# Patient Record
Sex: Male | Born: 1960 | Race: White | Hispanic: No | Marital: Married | Smoking: Former smoker
Health system: Southern US, Community
[De-identification: ages and names within clinical notes are randomized; demographics above are authoritative.]

## PROBLEM LIST (undated history)

## (undated) DIAGNOSIS — R7303 Prediabetes: Secondary | ICD-10-CM

## (undated) DIAGNOSIS — K219 Gastro-esophageal reflux disease without esophagitis: Secondary | ICD-10-CM

## (undated) DIAGNOSIS — I10 Essential (primary) hypertension: Secondary | ICD-10-CM

## (undated) HISTORY — PX: INGUINAL HERNIA REPAIR: SUR1180

## (undated) HISTORY — PX: APPENDECTOMY: SHX54

## (undated) HISTORY — PX: COLONOSCOPY: SHX174

---

## 2005-03-28 ENCOUNTER — Other Ambulatory Visit: Payer: Self-pay

## 2005-03-28 ENCOUNTER — Emergency Department: Payer: Self-pay | Admitting: Emergency Medicine

## 2005-03-29 ENCOUNTER — Ambulatory Visit: Payer: Self-pay | Admitting: Internal Medicine

## 2007-02-07 HISTORY — PX: NASAL SEPTOPLASTY W/ TURBINOPLASTY: SHX2070

## 2007-07-09 ENCOUNTER — Ambulatory Visit: Payer: Self-pay | Admitting: Unknown Physician Specialty

## 2007-08-07 ENCOUNTER — Ambulatory Visit: Payer: Self-pay | Admitting: Unknown Physician Specialty

## 2007-08-30 ENCOUNTER — Ambulatory Visit: Payer: Self-pay | Admitting: Gastroenterology

## 2007-10-13 ENCOUNTER — Ambulatory Visit: Payer: Self-pay | Admitting: Family Medicine

## 2007-11-29 ENCOUNTER — Ambulatory Visit: Payer: Self-pay | Admitting: Family Medicine

## 2013-09-18 ENCOUNTER — Ambulatory Visit: Payer: Self-pay | Admitting: Physician Assistant

## 2014-03-10 LAB — HEMOGLOBIN A1C: Hgb A1c MFr Bld: 5.5 % (ref 4.0–6.0)

## 2014-07-13 ENCOUNTER — Encounter: Payer: Self-pay | Admitting: Family Medicine

## 2014-07-13 DIAGNOSIS — K219 Gastro-esophageal reflux disease without esophagitis: Secondary | ICD-10-CM | POA: Insufficient documentation

## 2014-07-13 DIAGNOSIS — E785 Hyperlipidemia, unspecified: Secondary | ICD-10-CM | POA: Insufficient documentation

## 2014-07-13 DIAGNOSIS — L719 Rosacea, unspecified: Secondary | ICD-10-CM | POA: Insufficient documentation

## 2014-07-13 DIAGNOSIS — R7989 Other specified abnormal findings of blood chemistry: Secondary | ICD-10-CM | POA: Insufficient documentation

## 2014-07-13 DIAGNOSIS — N529 Male erectile dysfunction, unspecified: Secondary | ICD-10-CM | POA: Insufficient documentation

## 2014-07-13 DIAGNOSIS — R945 Abnormal results of liver function studies: Secondary | ICD-10-CM | POA: Insufficient documentation

## 2014-07-13 DIAGNOSIS — R739 Hyperglycemia, unspecified: Secondary | ICD-10-CM | POA: Insufficient documentation

## 2014-07-13 DIAGNOSIS — E7800 Pure hypercholesterolemia, unspecified: Secondary | ICD-10-CM | POA: Insufficient documentation

## 2014-07-13 DIAGNOSIS — I10 Essential (primary) hypertension: Secondary | ICD-10-CM | POA: Insufficient documentation

## 2014-07-13 DIAGNOSIS — S5010XA Contusion of unspecified forearm, initial encounter: Secondary | ICD-10-CM | POA: Insufficient documentation

## 2014-07-13 DIAGNOSIS — K589 Irritable bowel syndrome without diarrhea: Secondary | ICD-10-CM | POA: Insufficient documentation

## 2014-07-13 DIAGNOSIS — M79603 Pain in arm, unspecified: Secondary | ICD-10-CM | POA: Insufficient documentation

## 2014-07-13 NOTE — Progress Notes (Signed)
This encounter was created in error - please disregard.

## 2014-07-21 ENCOUNTER — Encounter: Payer: Self-pay | Admitting: Family Medicine

## 2014-07-21 ENCOUNTER — Ambulatory Visit (INDEPENDENT_AMBULATORY_CARE_PROVIDER_SITE_OTHER): Payer: 59 | Admitting: Family Medicine

## 2014-07-21 VITALS — BP 120/60 | HR 76 | Resp 16 | Ht 72.0 in | Wt 215.2 lb

## 2014-07-21 DIAGNOSIS — I10 Essential (primary) hypertension: Secondary | ICD-10-CM

## 2014-07-21 DIAGNOSIS — N528 Other male erectile dysfunction: Secondary | ICD-10-CM | POA: Diagnosis not present

## 2014-07-21 DIAGNOSIS — E7800 Pure hypercholesterolemia, unspecified: Secondary | ICD-10-CM

## 2014-07-21 DIAGNOSIS — N529 Male erectile dysfunction, unspecified: Secondary | ICD-10-CM

## 2014-07-21 MED ORDER — ATORVASTATIN CALCIUM 10 MG PO TABS: 10.0000 mg | ORAL_TABLET | Freq: Every day | ORAL | Status: DC

## 2014-07-21 MED ORDER — AMLODIPINE BESY-BENAZEPRIL HCL 5-10 MG PO CAPS: 1.0000 | ORAL_CAPSULE | Freq: Every day | ORAL | Status: DC

## 2014-07-21 NOTE — Progress Notes (Signed)
Name: Jeremy [name]   MRN: [id number]    DOB: 04/03/1960   Date:07/21/2014       Progress Note  Subjective  Chief Complaint  Chief Complaint  Patient presents with  . Hypertension    HPI  For f/u opf HBP.  Taking BP and chol meds as directed.  Reports a RUQ abd pain in the past that has resolved.  He will let us know if it returns.   History reviewed. No pertinent past medical history.  History reviewed. No pertinent past surgical history.  Family History  Problem Relation Age of Onset  . Cancer Mother     breast  . Clotting disorder Father   . Heart attack Father   . Cancer Brother     pancreatic  . Heart attack Brother     History   Social History  . Marital Status: Single    Spouse Name: N/A  . Number of Children: N/A  . Years of Education: N/A   Occupational History  . Not on file.   Social History Main Topics  . Smoking status: Former Smoker    Types: Cigarettes  . Smokeless tobacco: Never Used  . Alcohol Use: 7.2 oz/week    12 Cans of beer per week  . Drug Use: No  . Sexual Activity: Not on file   Other Topics Concern  . Not on file   Social History Narrative     Current outpatient prescriptions:  .  amLODipine-benazepril (LOTREL) 5-10 MG per capsule, Take by mouth., Disp: , Rfl:  .  atorvastatin (LIPITOR) 10 MG tablet, Take by mouth., Disp: , Rfl:  .  sildenafil (REVATIO) 20 MG tablet, Take by mouth., Disp: , Rfl:   Allergies  Allergen Reactions  . Cephalexin Other (See Comments)     Review of Systems  Constitutional: Negative.  Negative for fever, chills and malaise/fatigue.  HENT: Negative.  Negative for congestion and sore throat.   Eyes: Negative.  Negative for blurred vision.  Respiratory: Negative.  Negative for cough, shortness of breath and wheezing.   Cardiovascular: Negative.  Negative for chest pain, palpitations, orthopnea and leg swelling.  Gastrointestinal: Positive for abdominal pain (vague RUQ pain that has resolvd.).  Negative for heartburn, nausea, vomiting and diarrhea.  Musculoskeletal: Negative.   Skin: Negative.  Negative for rash.  Neurological: Negative for weakness and headaches.     Objective  Filed Vitals:   07/21/14 0839  BP: 106/63  Pulse: 76  Resp: 16  Height: 6' (1.829 m)  Weight: 215 lb 3.2 oz (97.614 kg)    Physical Exam  Constitutional: He is oriented to person, place, and time and well-developed, well-nourished, and in no distress. No distress.  HENT:  Head: Normocephalic and atraumatic.  Eyes: EOM are normal. Pupils are equal, round, and reactive to light.  Neck: Normal range of motion. Neck supple. No thyromegaly present.  Cardiovascular: Normal rate, regular rhythm, normal heart sounds and intact distal pulses.  Exam reveals no gallop and no friction rub.   No murmur heard. Pulmonary/Chest: Effort normal and breath sounds normal. No respiratory distress. He has no wheezes. He exhibits no tenderness.  Abdominal: Soft. He exhibits no mass. There is no tenderness.  Musculoskeletal: He exhibits no edema.  Lymphadenopathy:    He has no cervical adenopathy.  Neurological: He is alert and oriented to person, place, and time.  Vitals reviewed.         Assessment & Plan  Problem List Items Addressed This Visit  Cardiovascular and Mediastinum   Essential (primary) hypertension - Primary     Genitourinary   ED (erectile dysfunction) of organic origin     Other   Hypercholesteremia     1. Essential (primary) hypertension  - amLODipine-benazepril (LOTREL) 5-10 MG per capsule; Take 1 capsule by mouth daily.  Dispense: 990 capsule; Refill: 3  2. Hypercholesteremia  - atorvastatin (LIPITOR) 10 MG tablet; Take 1 tablet (10 mg total) by mouth daily at 6 PM.  Dispense: 90 tablet; Refill: 3  3. ED (erectile dysfunction) of organic origin

## 2014-11-10 ENCOUNTER — Encounter: Payer: Self-pay | Admitting: Family Medicine

## 2014-11-10 ENCOUNTER — Ambulatory Visit (INDEPENDENT_AMBULATORY_CARE_PROVIDER_SITE_OTHER): Payer: 59 | Admitting: Family Medicine

## 2014-11-10 ENCOUNTER — Telehealth: Payer: Self-pay

## 2014-11-10 VITALS — BP 144/82 | HR 82 | Temp 98.2°F | Resp 16 | Ht 72.0 in | Wt 213.0 lb

## 2014-11-10 DIAGNOSIS — L259 Unspecified contact dermatitis, unspecified cause: Secondary | ICD-10-CM | POA: Diagnosis not present

## 2014-11-10 MED ORDER — TRIAMCINOLONE ACETONIDE 0.1 % EX CREA: 1.0000 "application " | TOPICAL_CREAM | Freq: Two times a day (BID) | CUTANEOUS | Status: DC

## 2014-11-10 NOTE — Telephone Encounter (Signed)
Continue to use the steroid cream wherever it breaks out except around the eyes.-jh

## 2014-11-10 NOTE — Patient Instructions (Signed)
May take Benadryl orally for itching

## 2014-11-10 NOTE — Telephone Encounter (Signed)
Pt advised Jeremy [name]

## 2014-11-10 NOTE — Telephone Encounter (Signed)
Pt was seen today for poison Ivy and Dr. Juanetta Gosling mentioned that if it start spreading then call back in office please suggest ?

## 2014-11-10 NOTE — Progress Notes (Signed)
Name: Jeremy [name]   MRN: [id number]    DOB: December 19, 1960   Date:11/10/2014       Progress Note  Subjective  Chief Complaint  Chief Complaint  Patient presents with  . Poison Ivy    Left arm onset yesterday     HPI Here c/o itchy rash on L. Forearm.  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  Rash stated next day.    No problem-specific assessment & plan notes found for this encounter.   History reviewed. No pertinent past medical history.  Social History  Substance Use Topics  . Smoking status: Former Smoker    Types: Cigarettes  . Smokeless tobacco: Never Used  . Alcohol Use: 7.2 oz/week    12 Cans of beer per week     Current outpatient prescriptions:  .  amLODipine-benazepril (LOTREL) 5-10 MG per capsule, Take 1 capsule by mouth daily., Disp: 990 capsule, Rfl: 3 .  atorvastatin (LIPITOR) 10 MG tablet, Take 1 tablet (10 mg total) by mouth daily at 6 PM., Disp: 90 tablet, Rfl: 3 .  sildenafil (REVATIO) 20 MG tablet, Take by mouth., Disp: , Rfl:   Allergies  Allergen Reactions  . Cephalexin Other (See Comments)    Review of Systems  Constitutional: Negative for fever, chills and malaise/fatigue.  Respiratory: Negative for cough, sputum production, shortness of breath and wheezing.   Cardiovascular: Negative for chest pain, palpitations and leg swelling.  Gastrointestinal: Negative for heartburn, abdominal pain and blood in stool.  Genitourinary: Negative for dysuria, urgency and frequency.  Skin: Positive for itching and rash.  Neurological: Negative for dizziness and weakness.      Objective  Filed Vitals:   11/10/14 0850  BP: 144/82  Pulse: 82  Temp: 98.2 F (36.8 C)  TempSrc: Oral  Resp: 16  Height: 6' (1.829 m)  Weight: 213 lb (96.616 kg)     Physical Exam  Constitutional: He is well-developed, well-nourished, and in no distress. No distress.  HENT:  Head: Normocephalic and atraumatic.  Neck: Normal range of motion. Neck supple.  Cardiovascular: Normal  rate and regular rhythm.   Pulmonary/Chest: Effort normal.  Skin: Rash (Red, maculopapular rash with early vesicles on L. forearm) noted.  Vitals reviewed.     No results found for this or any previous visit (from the past 2160 hour(s)).   Assessment & Plan  1. Contact dermatitis  - triamcinolone cream (KENALOG) 0.1 %; Apply 1 application topically 2 (two) times daily.  Dispense: 30 g; Refill: 1

## 2015-01-07 ENCOUNTER — Encounter: Payer: Self-pay | Admitting: Family Medicine

## 2015-01-07 ENCOUNTER — Ambulatory Visit (INDEPENDENT_AMBULATORY_CARE_PROVIDER_SITE_OTHER): Payer: 59 | Admitting: Family Medicine

## 2015-01-07 ENCOUNTER — Ambulatory Visit
Admission: RE | Admit: 2015-01-07 | Discharge: 2015-01-07 | Disposition: A | Discharge status: Home / Self Care | Payer: 59 | Source: Ambulatory Visit | Attending: Family Medicine | Admitting: Family Medicine

## 2015-01-07 VITALS — BP 138/80 | HR 73 | Temp 97.8°F | Resp 16 | Ht 72.0 in | Wt 215.0 lb

## 2015-01-07 DIAGNOSIS — R1013 Epigastric pain: Secondary | ICD-10-CM | POA: Insufficient documentation

## 2015-01-07 DIAGNOSIS — Z23 Encounter for immunization: Secondary | ICD-10-CM | POA: Diagnosis not present

## 2015-01-07 DIAGNOSIS — R0789 Other chest pain: Secondary | ICD-10-CM

## 2015-01-07 DIAGNOSIS — E7800 Pure hypercholesterolemia, unspecified: Secondary | ICD-10-CM

## 2015-01-07 LAB — CBC WITH DIFFERENTIAL/PLATELET
BASOS ABS: 0.1 10*3/uL (ref 0.0–0.2)
Basos: 1 %
EOS (ABSOLUTE): 0.3 10*3/uL (ref 0.0–0.4)
Eos: 4 %
Hematocrit: 47.4 % (ref 37.5–51.0)
Hemoglobin: 16.7 g/dL (ref 12.6–17.7)
IMMATURE GRANS (ABS): 0.1 10*3/uL (ref 0.0–0.1)
Immature Granulocytes: 1 %
LYMPHS ABS: 3.4 10*3/uL — AB (ref 0.7–3.1)
Lymphs: 39 %
MCH: 32.6 pg (ref 26.6–33.0)
MCHC: 35.2 g/dL (ref 31.5–35.7)
MCV: 93 fL (ref 79–97)
MONOCYTES: 6 %
Monocytes Absolute: 0.6 10*3/uL (ref 0.1–0.9)
NEUTROS ABS: 4.4 10*3/uL (ref 1.4–7.0)
Neutrophils: 49 %
Platelets: 186 10*3/uL (ref 150–379)
RBC: 5.12 x10E6/uL (ref 4.14–5.80)
RDW: 12.9 % (ref 12.3–15.4)
WBC: 8.9 10*3/uL (ref 3.4–10.8)

## 2015-01-07 MED ORDER — PANTOPRAZOLE SODIUM 40 MG PO TBEC: 40.0000 mg | DELAYED_RELEASE_TABLET | Freq: Every day | ORAL | Status: DC

## 2015-01-07 NOTE — Patient Instructions (Addendum)
I think your pain is related is to GI causes.  We will try a medication for acid reflux once daily for about 1 month.  We are also going to check some lab work to see if we can determine the cause.  I am also going to do a chest XR to make sure your ribcage is normal.  Please seek immediate medical attention at ER or Urgent Care if you develop: Intractable vomiting or severe abdominal pain.  Chest pain, pressure or tightness. Shortness of breath accompanied by nausea or diaphoresis Visual changes Numbness or tingling on one side of the body Facial droop Altered mental status Or any concerning symptoms.

## 2015-01-07 NOTE — Assessment & Plan Note (Signed)
Check lipid panel today 

## 2015-01-07 NOTE — Progress Notes (Signed)
Subjective:    Patient ID: Jeremy BeaversJeffrey E [name], male    DOB: Aug 12, 1960, 54 y.o.   MRN: [id number]  HPI: Jeremy [name] is a 54 y.o. male presenting on 01/07/2015 for Breast Pain   HPI  Pt presents for sternal pain. Pain is located directly under the sternum at the epigastric region. Pt was evaluated cardiology- stress test normal, echo normal. Cardiac causes ruled out. This was 2 years ago. No X-ray of chest. No trauma to chest. Pain has been increasing over past few weeks and pt feels like there is a lump there. Denies SOB, nausea, syncope or chest tightness. Pain is described as constant dull ache.  History reviewed. No pertinent past medical history.  Current Outpatient Prescriptions on File Prior to Visit  Medication Sig  . amLODipine-benazepril (LOTREL) 5-10 MG per capsule Take 1 capsule by mouth daily.  Jeremy [name]. atorvastatin (LIPITOR) 10 MG tablet Take 1 tablet (10 mg total) by mouth daily at 6 PM.  . sildenafil (REVATIO) 20 MG tablet Take by mouth.   No current facility-administered medications on file prior to visit.    Review of Systems  Constitutional: Negative for fever and chills.  Respiratory: Negative for chest tightness.   Cardiovascular: Negative for chest pain, palpitations and leg swelling.  Neurological: Negative for dizziness and numbness.   Per HPI unless specifically indicated above     Objective:    BP 138/80 mmHg  Pulse 73  Temp(Src) 97.8 F (36.6 C) (Oral)  Resp 16  Ht 6' (1.829 m)  Wt 215 lb (97.523 kg)  BMI 29.15 kg/m2  Wt Readings from Last 3 Encounters:  01/07/15 215 lb (97.523 kg)  11/10/14 213 lb (96.616 kg)  07/21/14 215 lb 3.2 oz (97.614 kg)    Physical Exam  Constitutional: He is oriented to person, place, and time. He appears well-developed and well-nourished. No distress.  HENT:  Head: Normocephalic and atraumatic.  Neck: Neck supple. No thyromegaly present.  Cardiovascular: Normal rate, regular rhythm and normal heart sounds.  Exam  reveals no gallop and no friction rub.   No murmur heard. Pulmonary/Chest: Effort normal and breath sounds normal. He has no wheezes.  Abdominal: Soft. Bowel sounds are normal. He exhibits no distension. There is tenderness in the epigastric area. There is no rigidity, no rebound, no guarding, no tenderness at McBurney's point and negative Murphy's sign.  Musculoskeletal: Normal range of motion. He exhibits no edema or tenderness.  Neurological: He is alert and oriented to person, place, and time. He has normal reflexes.  Skin: Skin is warm and dry. No rash noted. No erythema.  Psychiatric: He has a normal mood and affect. His behavior is normal. Thought content normal.   Results for orders placed or performed in visit on 07/13/14  Hemoglobin A1c  Result Value Ref Range   Hgb A1c MFr Bld 5.5 4.0 - 6.0 %      Assessment & Plan:   Problem List Items Addressed This Visit      Other   Hypercholesteremia    Check lipid panel today.       Relevant Orders   Lipid panel    Other Visit Diagnoses    Chest wall pain    -  Primary    Suspect GI cause. CXR to rule out abnormality. EKG WNL.     Relevant Orders    EKG 12-Lead    DG Chest 2 View    Epigastric abdominal pain  GERD vs PUD. r/o pancreatitis. CMP, CBC, amylase, lipase. Start protonix.  RTC 1 mos. Consider GI if not improved. Alarm symptoms reviewed.     Relevant Medications    pantoprazole (PROTONIX) 40 MG tablet    Other Relevant Orders    DG Chest 2 View    Comprehensive Metabolic Panel (CMET)    Amylase    Lipase    CBC with Differential/Platelet    Need for influenza vaccination        Relevant Orders    Flu Vaccine QUAD 36+ mos PF IM (Fluarix & Fluzone Quad PF) (Completed)       Meds ordered this encounter  Medications  . pantoprazole (PROTONIX) 40 MG tablet    Sig: Take 1 tablet (40 mg total) by mouth daily.    Dispense:  30 tablet    Refill:  11    Order Specific Question:  Supervising Provider     Answer:  Janeann Forehand [phone]      Follow up plan: Return in about 4 weeks (around 02/04/2015) for Epigastric pain.Jeremy [name]

## 2015-01-08 LAB — COMPREHENSIVE METABOLIC PANEL
ALK PHOS: 96 IU/L (ref 39–117)
ALT: 34 IU/L (ref 0–44)
AST: 23 IU/L (ref 0–40)
Albumin/Globulin Ratio: 1.7 (ref 1.1–2.5)
Albumin: 4.4 g/dL (ref 3.5–5.5)
BUN/Creatinine Ratio: 15 (ref 9–20)
BUN: 13 mg/dL (ref 6–24)
Bilirubin Total: 0.4 mg/dL (ref 0.0–1.2)
CO2: 25 mmol/L (ref 18–29)
CREATININE: 0.87 mg/dL (ref 0.76–1.27)
Calcium: 10.1 mg/dL (ref 8.7–10.2)
Chloride: 99 mmol/L (ref 97–106)
GFR calc Af Amer: 113 mL/min/{1.73_m2} (ref >59)
GFR calc non Af Amer: 98 mL/min/{1.73_m2} (ref >59)
GLOBULIN, TOTAL: 2.6 g/dL (ref 1.5–4.5)
GLUCOSE: 113 mg/dL — AB (ref 65–99)
Potassium: 4.4 mmol/L (ref 3.5–5.2)
SODIUM: 140 mmol/L (ref 136–144)
Total Protein: 7 g/dL (ref 6.0–8.5)

## 2015-01-08 LAB — LIPID PANEL
Chol/HDL Ratio: 3.7 ratio units (ref 0.0–5.0)
Cholesterol, Total: 216 mg/dL — ABNORMAL HIGH (ref 100–199)
HDL: 58 mg/dL (ref >39)
LDL Calculated: 120 mg/dL — ABNORMAL HIGH (ref 0–99)
Triglycerides: 188 mg/dL — ABNORMAL HIGH (ref 0–149)
VLDL Cholesterol Cal: 38 mg/dL (ref 5–40)

## 2015-01-08 LAB — AMYLASE: AMYLASE: 67 U/L (ref 31–124)

## 2015-01-08 LAB — LIPASE: LIPASE: 42 U/L (ref 0–59)

## 2015-01-20 ENCOUNTER — Ambulatory Visit: Payer: 59 | Admitting: Family Medicine

## 2015-01-25 ENCOUNTER — Encounter: Payer: Self-pay | Admitting: Family Medicine

## 2015-01-25 ENCOUNTER — Ambulatory Visit (INDEPENDENT_AMBULATORY_CARE_PROVIDER_SITE_OTHER): Payer: 59 | Admitting: Family Medicine

## 2015-01-25 VITALS — BP 140/88 | HR 81 | Temp 98.8°F | Resp 16 | Ht 72.0 in | Wt 217.6 lb

## 2015-01-25 DIAGNOSIS — J209 Acute bronchitis, unspecified: Secondary | ICD-10-CM | POA: Diagnosis not present

## 2015-01-25 DIAGNOSIS — J0100 Acute maxillary sinusitis, unspecified: Secondary | ICD-10-CM

## 2015-01-25 DIAGNOSIS — R059 Cough, unspecified: Secondary | ICD-10-CM

## 2015-01-25 DIAGNOSIS — R05 Cough: Secondary | ICD-10-CM | POA: Diagnosis not present

## 2015-01-25 DIAGNOSIS — I10 Essential (primary) hypertension: Secondary | ICD-10-CM | POA: Diagnosis not present

## 2015-01-25 MED ORDER — LEVOFLOXACIN 500 MG PO TABS: 500.0000 mg | ORAL_TABLET | Freq: Every day | ORAL | Status: DC

## 2015-01-25 MED ORDER — ALBUTEROL SULFATE HFA 108 (90 BASE) MCG/ACT IN AERS: 2.0000 | INHALATION_SPRAY | Freq: Four times a day (QID) | RESPIRATORY_TRACT | Status: DC | PRN

## 2015-01-25 MED ORDER — DM-GUAIFENESIN ER 30-600 MG PO TB12: 1.0000 | ORAL_TABLET | Freq: Two times a day (BID) | ORAL | Status: DC

## 2015-01-25 MED ORDER — PROMETHAZINE-CODEINE 6.25-10 MG/5ML PO SYRP: 5.0000 mL | ORAL_SOLUTION | Freq: Every evening | ORAL | Status: DC | PRN

## 2015-01-25 MED ORDER — PREDNISONE 20 MG PO TABS: 40.0000 mg | ORAL_TABLET | Freq: Every day | ORAL | Status: DC

## 2015-01-25 NOTE — Patient Instructions (Addendum)
You can use supportive care at home to help with your symptoms. I have sent Mucinex DM to your pharmacy to help break up the congestion and soothe your cough. You can takes this twice daily.  Take cough medicine at bedtime to help with coughing. This will make you sleepy so only take at night. Honey is a natural cough suppressant- so add it to your tea in the morning.  If you have a humidifer, set that up in your bedroom at night.   Please seek immediate medical attention if you develop shortness of breath not relieve by inhaler, chest pain/tightness, fever > 103 F or other concerning symptoms.   Take prednisone to help with wheezing. 2 pills at breakfast for 5 days. Prednisone tablets What is this medicine? PREDNISONE (PRED ni sone) is a corticosteroid. It is commonly used to treat inflammation of the skin, joints, lungs, and other organs. Common conditions treated include asthma, allergies, and arthritis. It is also used for other conditions, such as blood disorders and diseases of the adrenal glands. This medicine may be used for other purposes; ask your health care provider or pharmacist if you have questions. What should I tell my health care provider before I take this medicine? They need to know if you have any of these conditions: -Cushing's syndrome -diabetes -glaucoma -heart disease -high blood pressure -infection (especially a virus infection such as chickenpox, cold sores, or herpes) -kidney disease -liver disease -mental illness -myasthenia gravis -osteoporosis -seizures -stomach or intestine problems -thyroid disease -an unusual or allergic reaction to lactose, prednisone, other medicines, foods, dyes, or preservatives -pregnant or trying to get pregnant -breast-feeding How should I use this medicine? Take this medicine by mouth with a glass of water. Follow the directions on the prescription label. Take this medicine with food. If you are taking this medicine once a  day, take it in the morning. Do not take more medicine than you are told to take. Do not suddenly stop taking your medicine because you may develop a severe reaction. Your doctor will tell you how much medicine to take. If your doctor wants you to stop the medicine, the dose may be slowly lowered over time to avoid any side effects. Talk to your pediatrician regarding the use of this medicine in children. Special care may be needed. Overdosage: If you think you have taken too much of this medicine contact a poison control center or emergency room at once. NOTE: This medicine is only for you. Do not share this medicine with others. What if I miss a dose? If you miss a dose, take it as soon as you can. If it is almost time for your next dose, talk to your doctor or health care professional. You may need to miss a dose or take an extra dose. Do not take double or extra doses without advice. What may interact with this medicine? Do not take this medicine with any of the following medications: -metyrapone -mifepristone This medicine may also interact with the following medications: -aminoglutethimide -amphotericin B -aspirin and aspirin-like medicines -barbiturates -certain medicines for diabetes, like glipizide or glyburide -cholestyramine -cholinesterase inhibitors -cyclosporine -digoxin -diuretics -ephedrine -male hormones, like estrogens and birth control pills -isoniazid -ketoconazole -NSAIDS, medicines for pain and inflammation, like ibuprofen or naproxen -phenytoin -rifampin -toxoids -vaccines -warfarin This list may not describe all possible interactions. Give your health care provider a list of all the medicines, herbs, non-prescription drugs, or dietary supplements you use. Also tell them if you smoke,  drink alcohol, or use illegal drugs. Some items may interact with your medicine. What should I watch for while using this medicine? Visit your doctor or health care professional  for regular checks on your progress. If you are taking this medicine over a prolonged period, carry an identification card with your name and address, the type and dose of your medicine, and your doctor's name and address. This medicine may increase your risk of getting an infection. Tell your doctor or health care professional if you are around anyone with measles or chickenpox, or if you develop sores or blisters that do not heal properly. If you are going to have surgery, tell your doctor or health care professional that you have taken this medicine within the last twelve months. Ask your doctor or health care professional about your diet. You may need to lower the amount of salt you eat. This medicine may affect blood sugar levels. If you have diabetes, check with your doctor or health care professional before you change your diet or the dose of your diabetic medicine. What side effects may I notice from receiving this medicine? Side effects that you should report to your doctor or health care professional as soon as possible: -allergic reactions like skin rash, itching or hives, swelling of the face, lips, or tongue -changes in emotions or moods -changes in vision -depressed mood -eye pain -fever or chills, cough, sore throat, pain or difficulty passing urine -increased thirst -swelling of ankles, feet Side effects that usually do not require medical attention (report to your doctor or health care professional if they continue or are bothersome): -confusion, excitement, restlessness -headache -nausea, vomiting -skin problems, acne, thin and shiny skin -trouble sleeping -weight gain This list may not describe all possible side effects. Call your doctor for medical advice about side effects. You may report side effects to FDA at 1-800-FDA-1088. Where should I keep my medicine? Keep out of the reach of children. Store at room temperature between 15 and 30 degrees C (59 and 86 degrees F).  Protect from light. Keep container tightly closed. Throw away any unused medicine after the expiration date. NOTE: This sheet is a summary. It may not cover all possible information. If you have questions about this medicine, talk to your doctor, pharmacist, or health care provider.    2016, [employer]. (2010-09-08 10:57:14)

## 2015-01-25 NOTE — Assessment & Plan Note (Signed)
Treat with prednisone and inhaler due to wheezing. Levaquin for sinus infection will cover for any lung organisms.  Cough suppressants at home. Alarm symptoms reviewed. Return to clinic if not improving.

## 2015-01-25 NOTE — Progress Notes (Signed)
Subjective:    Patient ID: Jeremy [name], male    DOB: Apr 10, 1960, 54 y.o.   MRN: [id number]  HPI: Jeremy [name] is a 54 y.o. male presenting on 01/25/2015 for Cough   HPI  Pt presents for cough and congestion x 1 week. Pt was feeling better by the end of last week but began to feel worse over the weekend. Symptoms began with congestion and sore throat. Current symptoms are cough and congestion.  Cough and sinus pain are the worst.  No chest tightness.  Coughing very hard, making abdomen sore. Sinus drainage and congestion. Some ear pain, L>R. Cough keeping awake at night. Maxillary sinus pressure.  Home treatment: Day Quil, Nyquil, Mucinex, and tessalon perles. Mild relief.    Bp's have remained stable throughout illness. Check BP at home. Denies CP, SOB, visual changes, or HA.  No past medical history on file.  Current Outpatient Prescriptions on File Prior to Visit  Medication Sig  . amLODipine-benazepril (LOTREL) 5-10 MG per capsule Take 1 capsule by mouth daily.  Marland Kitchen. atorvastatin (LIPITOR) 10 MG tablet Take 1 tablet (10 mg total) by mouth daily at 6 PM.  . pantoprazole (PROTONIX) 40 MG tablet Take 1 tablet (40 mg total) by mouth daily.  . sildenafil (REVATIO) 20 MG tablet Take by mouth.   No current facility-administered medications on file prior to visit.    Review of Systems  Constitutional: Positive for chills. Negative for fever.  HENT: Positive for congestion, ear pain, rhinorrhea and sneezing. Negative for postnasal drip.   Respiratory: Positive for cough and wheezing. Negative for chest tightness and shortness of breath.   Cardiovascular: Negative for chest pain, palpitations and leg swelling.  Skin: Negative.   Allergic/Immunologic: Positive for environmental allergies.  Neurological: Negative for syncope and headaches.   Per HPI unless specifically indicated above     Objective:    BP 140/88 mmHg  Pulse 81  Temp(Src) 98.8 F (37.1 C) (Oral)  Resp 16  Ht  6' (1.829 m)  Wt 217 lb 9.6 oz (98.703 kg)  BMI 29.51 kg/m2  SpO2 97%  Wt Readings from Last 3 Encounters:  01/25/15 217 lb 9.6 oz (98.703 kg)  01/07/15 215 lb (97.523 kg)  11/10/14 213 lb (96.616 kg)    Physical Exam  Constitutional: He appears well-developed and well-nourished. No distress.  HENT:  Head: Normocephalic and atraumatic.  Right Ear: Tympanic membrane is not erythematous and not bulging.  Left Ear: Tympanic membrane is retracted. Tympanic membrane is not erythematous and not bulging. A middle ear effusion is present.  Nose: Mucosal edema and rhinorrhea present. No sinus tenderness. Right sinus exhibits maxillary sinus tenderness. Right sinus exhibits no frontal sinus tenderness. Left sinus exhibits maxillary sinus tenderness. Left sinus exhibits no frontal sinus tenderness.  Mouth/Throat: Uvula is midline and mucous membranes are normal. Posterior oropharyngeal erythema present.  Neck: Neck supple. No Brudzinski's sign and no Kernig's sign noted.  Cardiovascular: Normal rate, regular rhythm and normal heart sounds.   Pulmonary/Chest: No accessory muscle usage. No tachypnea. No respiratory distress. He has no decreased breath sounds. He has wheezes in the right upper field, the right middle field, the right lower field, the left upper field and the left lower field. He has no rhonchi. He has no rales. Chest wall is not dull to percussion. He exhibits no tenderness.  Lymphadenopathy:    He has no cervical adenopathy.   Results for orders placed or performed in visit on 01/07/15  Comprehensive  Metabolic Panel (CMET)  Result Value Ref Range   Glucose 113 (H) 65 - 99 mg/dL   BUN 13 6 - 24 mg/dL   Creatinine, Ser 1.47 0.76 - 1.27 mg/dL   GFR calc non Af Amer 98 >59 mL/min/1.73   GFR calc Af Amer 113 >59 mL/min/1.73   BUN/Creatinine Ratio 15 9 - 20   Sodium 140 136 - 144 mmol/L   Potassium 4.4 3.5 - 5.2 mmol/L   Chloride 99 97 - 106 mmol/L   CO2 25 18 - 29 mmol/L   Calcium  10.1 8.7 - 10.2 mg/dL   Total Protein 7.0 6.0 - 8.5 g/dL   Albumin 4.4 3.5 - 5.5 g/dL   Globulin, Total 2.6 1.5 - 4.5 g/dL   Albumin/Globulin Ratio 1.7 1.1 - 2.5   Bilirubin Total 0.4 0.0 - 1.2 mg/dL   Alkaline Phosphatase 96 39 - 117 IU/L   AST 23 0 - 40 IU/L   ALT 34 0 - 44 IU/L  Amylase  Result Value Ref Range   Amylase 67 31 - 124 U/L  Lipase  Result Value Ref Range   Lipase 42 0 - 59 U/L  Lipid panel  Result Value Ref Range   Cholesterol, Total 216 (H) 100 - 199 mg/dL   Triglycerides 829 (H) 0 - 149 mg/dL   HDL 58 >56 mg/dL   VLDL Cholesterol Cal 38 5 - 40 mg/dL   LDL Calculated 213 (H) 0 - 99 mg/dL   Chol/HDL Ratio 3.7 0.0 - 5.0 ratio units  CBC with Differential/Platelet  Result Value Ref Range   WBC 8.9 3.4 - 10.8 x10E3/uL   RBC 5.12 4.14 - 5.80 x10E6/uL   Hemoglobin 16.7 12.6 - 17.7 g/dL   Hematocrit 08.6 57.8 - 51.0 %   MCV 93 79 - 97 fL   MCH 32.6 26.6 - 33.0 pg   MCHC 35.2 31.5 - 35.7 g/dL   RDW 46.9 62.9 - 52.8 %   Platelets 186 150 - 379 x10E3/uL   Neutrophils 49 %   Lymphs 39 %   Monocytes 6 %   Eos 4 %   Basos 1 %   Neutrophils Absolute 4.4 1.4 - 7.0 x10E3/uL   Lymphocytes Absolute 3.4 (H) 0.7 - 3.1 x10E3/uL   Monocytes Absolute 0.6 0.1 - 0.9 x10E3/uL   EOS (ABSOLUTE) 0.3 0.0 - 0.4 x10E3/uL   Basophils Absolute 0.1 0.0 - 0.2 x10E3/uL   Immature Granulocytes 1 %   Immature Grans (Abs) 0.1 0.0 - 0.1 x10E3/uL      Assessment & Plan:   Problem List Items Addressed This Visit      Cardiovascular and Mediastinum   Essential (primary) hypertension - Primary    Pt encouraged to use decongestants sparingly due to HBP. Encouraged topical decongestant. Monitor BP closely.         Respiratory   Acute bronchitis    Treat with prednisone and inhaler due to wheezing. Levaquin for sinus infection will cover for any lung organisms.  Cough suppressants at home. Alarm symptoms reviewed. Return to clinic if not improving.       Relevant Medications    dextromethorphan-guaiFENesin (MUCINEX DM) 30-600 MG 12hr tablet   albuterol (PROVENTIL HFA;VENTOLIN HFA) 108 (90 BASE) MCG/ACT inhaler   predniSONE (DELTASONE) 20 MG tablet    Other Visit Diagnoses    Acute maxillary sinusitis, recurrence not specified        Treat for sinus infection due to second worsening. Levquin given x 7 days. Encouraged  sparing use of topical decongestant. Saline nasal spray for sinus irriatio    Relevant Medications    levofloxacin (LEVAQUIN) 500 MG tablet    dextromethorphan-guaiFENesin (MUCINEX DM) 30-600 MG 12hr tablet    predniSONE (DELTASONE) 20 MG tablet    promethazine-codeine (PHENERGAN WITH CODEINE) 6.25-10 MG/5ML syrup    Cough        2/2 bronchitis. Codiene cough medication for at night to aid in sleep. Mucinex DM OTC during the day.     Relevant Medications    promethazine-codeine (PHENERGAN WITH CODEINE) 6.25-10 MG/5ML syrup       Meds ordered this encounter  Medications  . levofloxacin (LEVAQUIN) 500 MG tablet    Sig: Take 1 tablet (500 mg total) by mouth daily.    Dispense:  7 tablet    Refill:  0    Order Specific Question:  Supervising Provider    Answer:  Janeann Forehand [[id number]]  . dextromethorphan-guaiFENesin (MUCINEX DM) 30-600 MG 12hr tablet    Sig: Take 1 tablet by mouth 2 (two) times daily.    Dispense:  20 tablet    Refill:  0    Order Specific Question:  Supervising Provider    Answer:  Janeann Forehand [phone]  . albuterol (PROVENTIL HFA;VENTOLIN HFA) 108 (90 BASE) MCG/ACT inhaler    Sig: Inhale 2 puffs into the lungs every 6 (six) hours as needed for wheezing or shortness of breath.    Dispense:  1 Inhaler    Refill:  11    Order Specific Question:  Supervising Provider    Answer:  Janeann Forehand [phone]  . predniSONE (DELTASONE) 20 MG tablet    Sig: Take 2 tablets (40 mg total) by mouth daily with breakfast.    Dispense:  10 tablet    Refill:  0    Order Specific Question:  Supervising Provider    Answer:   Janeann Forehand [phone]  . promethazine-codeine (PHENERGAN WITH CODEINE) 6.25-10 MG/5ML syrup    Sig: Take 5 mLs by mouth at bedtime as needed for cough.    Dispense:  120 mL    Refill:  0    Order Specific Question:  Supervising Provider    Answer:  Janeann Forehand [[id number]]      Follow up plan: Return if symptoms worsen or fail to improve.

## 2015-01-25 NOTE — Assessment & Plan Note (Signed)
Pt encouraged to use decongestants sparingly due to HBP. Encouraged topical decongestant. Monitor BP closely.

## 2015-02-11 ENCOUNTER — Ambulatory Visit (INDEPENDENT_AMBULATORY_CARE_PROVIDER_SITE_OTHER): Payer: 59 | Admitting: Family Medicine

## 2015-02-11 VITALS — BP 138/86 | HR 87 | Temp 98.0°F | Resp 16 | Ht 72.0 in | Wt 218.0 lb

## 2015-02-11 DIAGNOSIS — E785 Hyperlipidemia, unspecified: Secondary | ICD-10-CM | POA: Diagnosis not present

## 2015-02-11 DIAGNOSIS — I10 Essential (primary) hypertension: Secondary | ICD-10-CM

## 2015-02-11 DIAGNOSIS — R7303 Prediabetes: Secondary | ICD-10-CM | POA: Diagnosis not present

## 2015-02-11 DIAGNOSIS — K219 Gastro-esophageal reflux disease without esophagitis: Secondary | ICD-10-CM | POA: Diagnosis not present

## 2015-02-11 DIAGNOSIS — R1013 Epigastric pain: Secondary | ICD-10-CM

## 2015-02-11 LAB — POCT GLYCOSYLATED HEMOGLOBIN (HGB A1C): Hemoglobin A1C: 6

## 2015-02-11 MED ORDER — PANTOPRAZOLE SODIUM 40 MG PO TBEC: 40.0000 mg | DELAYED_RELEASE_TABLET | Freq: Every day | ORAL | Status: DC

## 2015-02-11 MED ORDER — ATORVASTATIN CALCIUM 20 MG PO TABS: 20.0000 mg | ORAL_TABLET | Freq: Every day | ORAL | Status: DC

## 2015-02-11 MED ORDER — AMLODIPINE BESY-BENAZEPRIL HCL 5-10 MG PO CAPS: 1.0000 | ORAL_CAPSULE | Freq: Every day | ORAL | Status: DC

## 2015-02-11 NOTE — Assessment & Plan Note (Signed)
Symptoms resolved with protonix. Continue current medication regimen. Encouraged avoiding triggers.  Reviewed alarm symptoms.

## 2015-02-11 NOTE — Assessment & Plan Note (Signed)
Controlled. Last CMET shows good kidney function. DASH diet reviewed. ACE for renal protection.

## 2015-02-11 NOTE — Patient Instructions (Addendum)
Your goal blood pressure is 140/90. Work on low salt/sodium diet - goal <2.5gm (2,500mg ) per day. Eat a diet high in fruits/vegetables and whole grains.  Look into mediterranean and DASH diet. Goal activity is 16050min/wk of moderate intensity exercise.  This can be split into 30 minute chunks.  If you are not at this level, you can start with smaller 10-15 min increments and slowly build up activity. Look at www.heart.org for more resources   Please seek immediate medical attention at ER or Urgent Care if you develop: Chest pain, pressure or tightness. Shortness of breath accompanied by nausea or diaphoresis Visual changes Numbness or tingling on one side of the body Facial droop Altered mental status Or any concerning symptoms.   Your A1c is consistent with Prediabetes.  We need to focus on decreasing the sugar in your diet. The biggest bang for your buck is juice. Aim to reduce or eliminate.

## 2015-02-11 NOTE — Progress Notes (Signed)
Subjective:    Patient ID: Jeremy [name], male    DOB: May 22, 1960, 55 y.o.   MRN: [id number]  HPI: Jeremy [name] is a 55 y.o. male presenting on 02/11/2015 for Hypertension; Hyperlipidemia; and Gastroesophageal Reflux   Hypertension This is a chronic problem. Pertinent negatives include no anxiety, chest pain, headaches, palpitations or shortness of breath. Risk factors for coronary artery disease include male gender and dyslipidemia.  Hyperlipidemia This is a chronic problem. Pertinent negatives include no chest pain, leg pain, myalgias or shortness of breath. Current antihyperlipidemic treatment includes statins. The current treatment provides mild improvement of lipids. Risk factors for coronary artery disease include male sex.  Gastroesophageal Reflux He reports no abdominal pain, no chest pain, no coughing, no dysphagia, no early satiety, no hoarse voice, no nausea, no sore throat or no wheezing. This is a new problem. The current episode started more than 1 month ago. The problem has been rapidly improving. The symptoms are aggravated by certain foods. Pertinent negatives include no anemia, fatigue, muscle weakness or orthopnea. He has tried a PPI for the symptoms. The treatment provided significant relief.    Pt presents for epigastric follow-up. Started on Protonix last month. Completely resolved with protonix. Tolerating well. No dysphagia. No regurg. HTN: BP is doing well- No CP, HA, or SOB at home. Taking medication as directed. Tries to eat a low salt diet.  HLD: Elevated- No CP, no muscle aches. Tries to eat heart healthy diet.  Glucose was elevated on last labs. Needs A1c Today. Drinks a lot of juice. Does exercise by walking.    No past medical history on file.  Current Outpatient Prescriptions on File Prior to Visit  Medication Sig  . albuterol (PROVENTIL HFA;VENTOLIN HFA) 108 (90 BASE) MCG/ACT inhaler Inhale 2 puffs into the lungs every 6 (six) hours as needed for  wheezing or shortness of breath.  . sildenafil (REVATIO) 20 MG tablet Take by mouth.   No current facility-administered medications on file prior to visit.    Review of Systems  Constitutional: Negative for fever, chills and fatigue.  HENT: Negative for hoarse voice and sore throat.   Respiratory: Negative for cough, chest tightness, shortness of breath and wheezing.   Cardiovascular: Negative for chest pain and palpitations.  Gastrointestinal: Negative.  Negative for dysphagia, nausea, vomiting and abdominal pain.  Endocrine: Negative for cold intolerance, heat intolerance, polydipsia, polyphagia and polyuria.  Musculoskeletal: Negative for myalgias and muscle weakness.  Neurological: Negative for light-headedness, numbness and headaches.  Psychiatric/Behavioral: Negative.    Per HPI unless specifically indicated above     Objective:    BP 138/86 mmHg  Pulse 87  Temp(Src) 98 F (36.7 C) (Oral)  Resp 16  Ht 6' (1.829 m)  Wt 218 lb (98.884 kg)  BMI 29.56 kg/m2  Wt Readings from Last 3 Encounters:  02/11/15 218 lb (98.884 kg)  01/25/15 217 lb 9.6 oz (98.703 kg)  01/07/15 215 lb (97.523 kg)    Physical Exam  Constitutional: He is oriented to person, place, and time. He appears well-developed and well-nourished. No distress.  Neck: Normal range of motion. Neck supple. No thyromegaly present.  Cardiovascular: Normal rate and regular rhythm.  Exam reveals no gallop and no friction rub.   No murmur heard. Pulmonary/Chest: Effort normal and breath sounds normal.  Abdominal: Soft. Bowel sounds are normal. There is no tenderness. There is no rebound.  Musculoskeletal: Normal range of motion. He exhibits no edema or tenderness.  Lymphadenopathy:  He has no cervical adenopathy.  Neurological: He is alert and oriented to person, place, and time.  Skin: Skin is warm and dry. He is not diaphoretic.   Results for orders placed or performed in visit on 02/11/15  POCT HgB A1C    Result Value Ref Range   Hemoglobin A1C 6.0       Assessment & Plan:   Problem List Items Addressed This Visit      Cardiovascular and Mediastinum   Essential (primary) hypertension - Primary    Controlled. Last CMET shows good kidney function. DASH diet reviewed. ACE for renal protection.       Relevant Medications   atorvastatin (LIPITOR) 20 MG tablet   amLODipine-benazepril (LOTREL) 5-10 MG capsule     Digestive   Gastric reflux    Symptoms resolved with protonix. Continue current medication regimen. Encouraged avoiding triggers.  Reviewed alarm symptoms.       Relevant Medications   pantoprazole (PROTONIX) 40 MG tablet     Other   HLD (hyperlipidemia)    Increase statin to 20mg  to see if we can improve lipid profile if patient tolerates.  Recheck 3 mos.       Relevant Medications   atorvastatin (LIPITOR) 20 MG tablet   amLODipine-benazepril (LOTREL) 5-10 MG capsule   Prediabetes    HgA1c is consistent with prediabetes. Discussed strategies to lower risk for developing diabetes: Goal- decr juice intake. Increase exercise. Reduce carb portion sizes.  [facility].  I spent >50% of the visit with face to face counseling about prediabetes management.       Relevant Orders   POCT HgB A1C (Completed)    Other Visit Diagnoses    Epigastric abdominal pain        GERD vs PUD. r/o pancreatitis. CMP, CBC, amylase, lipase. Start protonix.  RTC 1 mos. Consider GI if not improved. Alarm symptoms reviewed.     Relevant Medications    pantoprazole (PROTONIX) 40 MG tablet       Meds ordered this encounter  Medications  . Multiple Vitamin (MULTIVITAMIN) tablet    Sig: Take 1 tablet by mouth daily. Men's a day  . atorvastatin (LIPITOR) 20 MG tablet    Sig: Take 1 tablet (20 mg total) by mouth daily.    Dispense:  90 tablet    Refill:  3    Order Specific Question:  Supervising Provider    Answer:  Janeann ForehandHAWKINS JR, JAMES H [phone][[id number]]  . pantoprazole  (PROTONIX) 40 MG tablet    Sig: Take 1 tablet (40 mg total) by mouth daily.    Dispense:  90 tablet    Refill:  3    Order Specific Question:  Supervising Provider    Answer:  Janeann ForehandHAWKINS JR, JAMES H [phone][[id number]]  . amLODipine-benazepril (LOTREL) 5-10 MG capsule    Sig: Take 1 capsule by mouth daily.    Dispense:  90 capsule    Refill:  3    Order Specific Question:  Supervising Provider    Answer:  Janeann ForehandHAWKINS JR, JAMES H [[id number]]      Follow up plan: Return in about 3 months (around 05/12/2015) for Prediabetes.

## 2015-02-11 NOTE — Assessment & Plan Note (Signed)
Increase statin to 20mg  to see if we can improve lipid profile if patient tolerates.  Recheck 3 mos.

## 2015-02-11 NOTE — Assessment & Plan Note (Signed)
HgA1c is consistent with prediabetes. Discussed strategies to lower risk for developing diabetes: Goal- decr juice intake. Increase exercise. Reduce carb portion sizes.  Encouraged Pred[facility]abetes class at [facility].  I spent >50% of the visit with face to face counseling about prediabetes management.

## 2015-02-16 ENCOUNTER — Telehealth: Payer: Self-pay | Admitting: Family Medicine

## 2015-02-16 DIAGNOSIS — E785 Hyperlipidemia, unspecified: Secondary | ICD-10-CM

## 2015-02-16 DIAGNOSIS — I10 Essential (primary) hypertension: Secondary | ICD-10-CM

## 2015-02-16 DIAGNOSIS — R1013 Epigastric pain: Secondary | ICD-10-CM

## 2015-02-16 MED ORDER — ATORVASTATIN CALCIUM 20 MG PO TABS: 20.0000 mg | ORAL_TABLET | Freq: Every day | ORAL | Status: DC

## 2015-02-16 MED ORDER — AMLODIPINE BESY-BENAZEPRIL HCL 5-10 MG PO CAPS: 1.0000 | ORAL_CAPSULE | Freq: Every day | ORAL | Status: DC

## 2015-02-16 MED ORDER — PANTOPRAZOLE SODIUM 40 MG PO TBEC: 40.0000 mg | DELAYED_RELEASE_TABLET | Freq: Every day | ORAL | Status: DC

## 2015-02-16 NOTE — Telephone Encounter (Signed)
Pt needs his pharmacy changed to [facility] phone [phone] fax [phone]

## 2015-02-16 NOTE — Telephone Encounter (Signed)
Sent. Thanks.   

## 2015-02-16 NOTE — Telephone Encounter (Signed)
Called pt. [facility] no longer does mail order prescriptions. Is there another mail service I should send them to or just sent [facility] local [facility]?

## 2015-05-17 ENCOUNTER — Ambulatory Visit (INDEPENDENT_AMBULATORY_CARE_PROVIDER_SITE_OTHER): Payer: 59 | Admitting: Family Medicine

## 2015-05-17 DIAGNOSIS — I10 Essential (primary) hypertension: Secondary | ICD-10-CM

## 2015-05-17 DIAGNOSIS — R7303 Prediabetes: Secondary | ICD-10-CM

## 2015-05-17 LAB — POCT GLYCOSYLATED HEMOGLOBIN (HGB A1C): Hemoglobin A1C: 6

## 2015-05-17 NOTE — Patient Instructions (Signed)
Try walking 10 minutes after each meal to get 30 minutes of exercise per day.  Keep up the diet changes you have made.

## 2015-05-17 NOTE — Progress Notes (Signed)
Subjective:    Patient ID: Jeremy BeaversJeffrey E Kalil, male    DOB: 05-21-1960, 55 y.o.   MRN: [id number]  HPI: Jeremy [name] is a 55 y.o. male presenting on 05/17/2015 for Hypertension and Hyperglycemia   HPI  Pt presents for follow-up of prediabetes. Hg A1c was 6.0%- has been watching his carbohydrates. Has lost 3 lbs since his last visit. Is eating a lot a greens, grilled veggies, grilled chicken breast. Has stopped drinking juice. Is exercising- yard work and walking. No formal exercise. Drinking only water. Pt would not like to add metformin today.   No past medical history on file.  Current Outpatient Prescriptions on File Prior to Visit  Medication Sig  . albuterol (PROVENTIL HFA;VENTOLIN HFA) 108 (90 BASE) MCG/ACT inhaler Inhale 2 puffs into the lungs every 6 (six) hours as needed for wheezing or shortness of breath.  Marland Kitchen. amLODipine-benazepril (LOTREL) 5-10 MG capsule Take 1 capsule by mouth daily.  Marland Kitchen. atorvastatin (LIPITOR) 20 MG tablet Take 1 tablet (20 mg total) by mouth daily.  . Multiple Vitamin (MULTIVITAMIN) tablet Take 1 tablet by mouth daily. Men's a day  . pantoprazole (PROTONIX) 40 MG tablet Take 1 tablet (40 mg total) by mouth daily.   No current facility-administered medications on file prior to visit.    Review of Systems  Constitutional: Negative for fever and chills.  HENT: Negative.   Respiratory: Negative for chest tightness, shortness of breath and wheezing.   Cardiovascular: Negative for chest pain, palpitations and leg swelling.  Gastrointestinal: Negative for nausea, vomiting and abdominal pain.  Endocrine: Negative.   Genitourinary: Negative for dysuria, urgency, discharge, penile pain and testicular pain.  Musculoskeletal: Negative for back pain, joint swelling and arthralgias.  Skin: Negative.   Neurological: Negative for dizziness, weakness, numbness and headaches.  Psychiatric/Behavioral: Negative for sleep disturbance and dysphoric mood.   Per HPI  unless specifically indicated above     Objective:    BP 124/62 mmHg  Pulse 79  Temp(Src) 98.4 F (36.9 C) (Oral)  Resp 16  Ht 6' (1.829 m)  Wt 215 lb 9.6 oz (97.796 kg)  BMI 29.23 kg/m2  Wt Readings from Last 3 Encounters:  05/17/15 215 lb 9.6 oz (97.796 kg)  02/11/15 218 lb (98.884 kg)  01/25/15 217 lb 9.6 oz (98.703 kg)    Physical Exam  Constitutional: He is oriented to person, place, and time. He appears well-developed and well-nourished. No distress.  HENT:  Head: Normocephalic and atraumatic.  Neck: Neck supple. No thyromegaly present.  Cardiovascular: Normal rate, regular rhythm and normal heart sounds.  Exam reveals no gallop and no friction rub.   No murmur heard. Pulmonary/Chest: Effort normal and breath sounds normal. He has no wheezes.  Abdominal: Soft. Bowel sounds are normal. He exhibits no distension. There is no tenderness. There is no rebound.  Musculoskeletal: Normal range of motion. He exhibits no edema or tenderness.  Neurological: He is alert and oriented to person, place, and time. He has normal reflexes.  Skin: Skin is warm and dry. No rash noted. No erythema.  Psychiatric: He has a normal mood and affect. His behavior is normal. Thought content normal.   Results for orders placed or performed in visit on 05/17/15  POCT HgB A1C  Result Value Ref Range   Hemoglobin A1C 6.0       Assessment & Plan:   Problem List Items Addressed This Visit      Cardiovascular and Mediastinum   Essential (primary) hypertension  BP controlled today. Continue current regimen. Encouraged heart healthy diet. Recheck 3 mos.         Other   Prediabetes    A1c no change. Discussed metformin vs. Intensive lifestyle changes. Pt would like to try lifestyle. Encouraged continued diet changes. Goal for 10 minutes of walking after each meal to increase exercise and weight loss. Plan for metformin if no reduction in A1c.  Recheck 3 mos.      Relevant Orders   POCT HgB  A1C (Completed)      No orders of the defined types were placed in this encounter.      Follow up plan: Return in about 3 months (around 08/16/2015) for Prediabetes. Marland Kitchen

## 2015-05-17 NOTE — Assessment & Plan Note (Signed)
BP controlled today. Continue current regimen. Encouraged heart healthy diet. Recheck 3 mos.

## 2015-05-17 NOTE — Assessment & Plan Note (Signed)
A1c no change. Discussed metformin vs. Intensive lifestyle changes. Pt would like to try lifestyle. Encouraged continued diet changes. Goal for 10 minutes of walking after each meal to increase exercise and weight loss. Plan for metformin if no reduction in A1c.  Recheck 3 mos.

## 2015-08-23 ENCOUNTER — Encounter: Payer: Self-pay | Admitting: Family Medicine

## 2015-08-23 ENCOUNTER — Ambulatory Visit (INDEPENDENT_AMBULATORY_CARE_PROVIDER_SITE_OTHER): Payer: 59 | Admitting: Family Medicine

## 2015-08-23 VITALS — BP 130/80 | HR 72 | Temp 98.5°F | Resp 16 | Ht 72.0 in | Wt 206.0 lb

## 2015-08-23 DIAGNOSIS — R7303 Prediabetes: Secondary | ICD-10-CM

## 2015-08-23 DIAGNOSIS — K219 Gastro-esophageal reflux disease without esophagitis: Secondary | ICD-10-CM | POA: Diagnosis not present

## 2015-08-23 DIAGNOSIS — E785 Hyperlipidemia, unspecified: Secondary | ICD-10-CM | POA: Diagnosis not present

## 2015-08-23 DIAGNOSIS — I10 Essential (primary) hypertension: Secondary | ICD-10-CM

## 2015-08-23 LAB — CBC WITH DIFFERENTIAL/PLATELET
BASOS ABS: 76 {cells}/uL (ref 0–200)
Basophils Relative: 1 %
EOS ABS: 228 {cells}/uL (ref 15–500)
EOS PCT: 3 %
HCT: 46.1 % (ref 38.5–50.0)
HEMOGLOBIN: 15.8 g/dL (ref 13.2–17.1)
LYMPHS ABS: 2736 {cells}/uL (ref 850–3900)
Lymphocytes Relative: 36 %
MCH: 31.5 pg (ref 27.0–33.0)
MCHC: 34.3 g/dL (ref 32.0–36.0)
MCV: 91.8 fL (ref 80.0–100.0)
MONOS PCT: 7 %
MPV: 12.2 fL (ref 7.5–12.5)
Monocytes Absolute: 532 cells/uL (ref 200–950)
NEUTROS PCT: 53 %
Neutro Abs: 4028 cells/uL (ref 1500–7800)
Platelets: 192 10*3/uL (ref 140–400)
RBC: 5.02 MIL/uL (ref 4.20–5.80)
RDW: 13.2 % (ref 11.0–15.0)
WBC: 7.6 10*3/uL (ref 3.8–10.8)

## 2015-08-23 LAB — POCT GLYCOSYLATED HEMOGLOBIN (HGB A1C): HEMOGLOBIN A1C: 5.8

## 2015-08-23 MED ORDER — RANITIDINE HCL 150 MG PO CAPS: 150.0000 mg | ORAL_CAPSULE | Freq: Two times a day (BID) | ORAL | Status: DC

## 2015-08-23 NOTE — Assessment & Plan Note (Signed)
Reduction in A1c to 5.8%- pt had committed to diet and lifestyle changes. 9lb weight loss. Improved diet.  Recheck 3 mos.

## 2015-08-23 NOTE — Progress Notes (Signed)
Subjective:    Patient ID: Jeremy [name], male    DOB: 03-19-1960, 55 y.o.   MRN: [id number]  HPI: Jeremy [name] is a 55 y.o. male presenting on 08/23/2015 for Pre-diabetes and Hypertension   HPI  Pt presents for pre-diabetes and hypertension.  A1c is down 5.8%. Weight is down- 9lb wieght loss. Runs on treadmill at [employer].  BP is controlled. GERD: Taking protonix every other day. Symptoms are doing well with the adjusted regimen.    No past medical history on file.  Current Outpatient Prescriptions on File Prior to Visit  Medication Sig  . albuterol (PROVENTIL HFA;VENTOLIN HFA) 108 (90 BASE) MCG/ACT inhaler Inhale 2 puffs into the lungs every 6 (six) hours as needed for wheezing or shortness of breath.  Marland Kitchen. amLODipine-benazepril (LOTREL) 5-10 MG capsule Take 1 capsule by mouth daily.  Marland Kitchen. atorvastatin (LIPITOR) 20 MG tablet Take 1 tablet (20 mg total) by mouth daily.  . Multiple Vitamin (MULTIVITAMIN) tablet Take 1 tablet by mouth daily. Men's a day   No current facility-administered medications on file prior to visit.    Review of Systems  Constitutional: Negative for fever and chills.  HENT: Negative.   Respiratory: Negative for chest tightness, shortness of breath and wheezing.   Cardiovascular: Negative for chest pain, palpitations and leg swelling.  Gastrointestinal: Negative for nausea, vomiting and abdominal pain.  Endocrine: Negative.   Genitourinary: Negative for dysuria, urgency, discharge, penile pain and testicular pain.  Musculoskeletal: Negative for back pain, joint swelling and arthralgias.  Skin: Negative.   Neurological: Negative for dizziness, weakness, numbness and headaches.  Psychiatric/Behavioral: Negative for sleep disturbance and dysphoric mood.   Per HPI unless specifically indicated above     Objective:    BP 130/80 mmHg  Pulse 72  Temp(Src) 98.5 F (36.9 C) (Oral)  Resp 16  Ht 6' (1.829 m)  Wt 206 lb (93.441 kg)  BMI 27.93 kg/m2   SpO2 98%  Wt Readings from Last 3 Encounters:  08/23/15 206 lb (93.441 kg)  05/17/15 215 lb 9.6 oz (97.796 kg)  02/11/15 218 lb (98.884 kg)    Physical Exam  Constitutional: He is oriented to person, place, and time. He appears well-developed and well-nourished. No distress.  HENT:  Head: Normocephalic and atraumatic.  Neck: Neck supple. No thyromegaly present.  Cardiovascular: Normal rate, regular rhythm and normal heart sounds.  Exam reveals no gallop and no friction rub.   No murmur heard. Pulmonary/Chest: Effort normal and breath sounds normal. He has no wheezes.  Abdominal: Soft. Bowel sounds are normal. He exhibits no distension. There is no tenderness. There is no rebound.  Musculoskeletal: Normal range of motion. He exhibits no edema or tenderness.  Neurological: He is alert and oriented to person, place, and time. He has normal reflexes.  Skin: Skin is warm and dry. No rash noted. No erythema.  Psychiatric: He has a normal mood and affect. His behavior is normal. Thought content normal.   Results for orders placed or performed in visit on 08/23/15  POCT HgB A1C  Result Value Ref Range   Hemoglobin A1C 5.8       Assessment & Plan:   Problem List Items Addressed This Visit      Cardiovascular and Mediastinum   BP (high blood pressure)   Relevant Orders   COMPLETE METABOLIC PANEL WITH GFR     Digestive   Gastric reflux    Discussed longterm PPI use. Pt is weaning of pantoprazole and would  like to try H2 blocker PRN.  Reviewed diet and lifestyle changes to control GERD. Recheck 3 mos.       Relevant Medications   ranitidine (ZANTAC) 150 MG capsule   Other Relevant Orders   CBC with Differential/Platelet     Other   HLD (hyperlipidemia)    Check lipid panel today. Encouraged continued diet and lifestyle changes.       Relevant Orders   Lipid panel   Prediabetes - Primary    Reduction in A1c to 5.8%- pt had committed to diet and lifestyle changes. 9lb weight  loss. Improved diet.  Recheck 3 mos.       Relevant Orders   POCT HgB A1C (Completed)      Meds ordered this encounter  Medications  . ranitidine (ZANTAC) 150 MG capsule    Sig: Take 1 capsule (150 mg total) by mouth 2 (two) times daily.    Dispense:  180 capsule    Refill:  3      Follow up plan: Return in about 3 months (around 11/23/2015) for Pre-diabetes. Marland Kitchen

## 2015-08-23 NOTE — Assessment & Plan Note (Signed)
Discussed longterm PPI use. Pt is weaning of pantoprazole and would like to try H2 blocker PRN.  Reviewed diet and lifestyle changes to control GERD. Recheck 3 mos.

## 2015-08-23 NOTE — Assessment & Plan Note (Signed)
Check lipid panel today. Encouraged continued diet and lifestyle changes.  

## 2015-08-23 NOTE — Patient Instructions (Signed)
Keep up the good work on your pre-diabetes!  We will plan on trying to wean you off the pantoprazole. Take every other day. I have sent in ranitidine for twice daily to change over for your acid reflux.

## 2015-08-24 LAB — COMPLETE METABOLIC PANEL WITH GFR
ALT: 29 U/L (ref 9–46)
AST: 23 U/L (ref 10–35)
Albumin: 4.2 g/dL (ref 3.6–5.1)
Alkaline Phosphatase: 84 U/L (ref 40–115)
BUN: 14 mg/dL (ref 7–25)
CHLORIDE: 105 mmol/L (ref 98–110)
CO2: 23 mmol/L (ref 20–31)
Calcium: 9.6 mg/dL (ref 8.6–10.3)
Creat: 0.9 mg/dL (ref 0.70–1.33)
Glucose, Bld: 106 mg/dL — ABNORMAL HIGH (ref 65–99)
Potassium: 4.4 mmol/L (ref 3.5–5.3)
Sodium: 140 mmol/L (ref 135–146)
Total Bilirubin: 0.6 mg/dL (ref 0.2–1.2)
Total Protein: 6.6 g/dL (ref 6.1–8.1)

## 2015-08-24 LAB — LIPID PANEL
Cholesterol: 163 mg/dL (ref 125–200)
HDL: 62 mg/dL (ref >=40)
LDL CALC: 79 mg/dL (ref <130)
TRIGLYCERIDES: 108 mg/dL (ref <150)
Total CHOL/HDL Ratio: 2.6 Ratio (ref <=5.0)
VLDL: 22 mg/dL (ref <30)

## 2015-11-29 ENCOUNTER — Ambulatory Visit: Payer: 59 | Admitting: Family Medicine

## 2015-11-29 ENCOUNTER — Encounter: Payer: Self-pay | Admitting: Family Medicine

## 2015-11-29 ENCOUNTER — Ambulatory Visit (INDEPENDENT_AMBULATORY_CARE_PROVIDER_SITE_OTHER): Payer: 59 | Admitting: Family Medicine

## 2015-11-29 VITALS — BP 130/80 | HR 78 | Temp 98.6°F | Resp 16 | Ht 72.0 in | Wt 214.0 lb

## 2015-11-29 DIAGNOSIS — R7303 Prediabetes: Secondary | ICD-10-CM

## 2015-11-29 DIAGNOSIS — E669 Obesity, unspecified: Secondary | ICD-10-CM | POA: Insufficient documentation

## 2015-11-29 DIAGNOSIS — E663 Overweight: Secondary | ICD-10-CM | POA: Insufficient documentation

## 2015-11-29 DIAGNOSIS — Z23 Encounter for immunization: Secondary | ICD-10-CM | POA: Diagnosis not present

## 2015-11-29 DIAGNOSIS — I10 Essential (primary) hypertension: Secondary | ICD-10-CM | POA: Diagnosis not present

## 2015-11-29 DIAGNOSIS — E782 Mixed hyperlipidemia: Secondary | ICD-10-CM | POA: Diagnosis not present

## 2015-11-29 DIAGNOSIS — Z114 Encounter for screening for human immunodeficiency virus [HIV]: Secondary | ICD-10-CM | POA: Diagnosis not present

## 2015-11-29 NOTE — Assessment & Plan Note (Addendum)
Well controlled No complications Continue Amlodipine-Benazepril 5-10mg  daily, with ACE covers for renal protection in Pre-DM patient Future labs CMET in 08/2016

## 2015-11-29 NOTE — Assessment & Plan Note (Signed)
Reviewed last lipid panel 08/2015, well controlled on Atorvastatin 20mg  daily No change today Continue improved lifestyle mods Follow-up 08/2016 for fasting lipid panel, future order

## 2015-11-29 NOTE — Patient Instructions (Signed)
Thank you for coming in to clinic today.  1. Keep up the great work! A1c 5.8, which is stable since last check. - Re-check A1c in 3 months, anytime after 02/29/16  2. BP and cholesterol are controlled, continue meds without change  You will be due for FASTING BLOOD WORK (no food or drink after midnight before, only water or coffee without cream/sugar on the morning of)  - Please go ahead and schedule a "Lab Only" visit in the morning at the clinic for lab draw in JULY 2018, about 1 week prior to your scheduled Annual Physical  Please schedule a follow-up appointment with Dr. Althea CharonKaramalegos in 3 months for A1c, Pre-Diabetes, and then Annual Physical in July 2018  If you have any other questions or concerns, please feel free to call the clinic or send a message through MyChart. You may also schedule an earlier appointment if necessary.  Jeremy PilarAlexander Savalas Monje, DO [facility]

## 2015-11-29 NOTE — Assessment & Plan Note (Signed)
Well controlled Pre-Dm, A1c stable at 5.8 with diet and exercise. No medications - discussed metformin previously, has not needed to start yet Continue lifestyle Follow-up 3 months, anticipate A1c should be improved, then can space to q 6 mo with physical and labs

## 2015-11-29 NOTE — Progress Notes (Signed)
Subjective:    Patient ID: Jeremy [name], male    DOB: 1960-12-02, 55 y.o.   MRN: [id number]  Jeremy [name] is a 55 y.o. male presenting on 11/29/2015 for Diabetes (prediabetes ) and Hypertension  HPI  Pre-DM Reports significant lifestyle improvements, last A1c 5.8. He feels good today. CBGs: Does not check CBG Meds: Never started any anti-DM meds. Lifestyle controlled. Previously provider considered metformin, never started. Currently on ACEi - tolerating well Lifestyle: Significantly improved DM diet with mostly eliminated carbs, exercise improved since 04/2015 with inc cardio at gym, and recent months he has added trainer at gym, x 4 days weekly 1.5 -2 hours, strength training and cardio (up to 4 miles on treadmill), some weight gain attributes to muscle mass up to 5 lbs gained Denies hypoglycemia, polyuria, visual changes, numbness or tingling.  CHRONIC HTN: Reports has been well controlled for several years. Current Meds - Amlodpine-Benzepril  5-49m daily Reports good compliance, took meds today. Tolerating well, w/o complaints. Lifestyle - see above Denies CP, dyspnea, HA, edema, dizziness / lightheadedness  HYPERLIPIDEMIA: - Reports no concerns. Last lipid panel 08/2015, well controlled reviewed lab values today with patient. - Currently taking atorvastatin 220mdaily, tolerating well without side effects or myalgias - Some weight gain but states muscle mass with new gym regimen  Health Maintenance: - Only due for routine HIV screen today, however he admits to frequently donating blood to [employer], and has been screened /tested for HIV in past, negative but does not have record. Will get lab next annual physical   Social History  Substance Use Topics  . Smoking status: Former Smoker    Types: Cigarettes  . Smokeless tobacco: Never Used  . Alcohol use 7.2 oz/week    12 Cans of beer per week    Review of Systems Per HPI unless specifically indicated above       Objective:    BP 130/80   Pulse 78   Temp 98.6 F (37 C) (Oral)   Resp 16   Ht 6' (1.829 m)   Wt 214 lb (97.1 kg)   SpO2 98%   BMI 29.02 kg/m   Wt Readings from Last 3 Encounters:  11/29/15 214 lb (97.1 kg)  08/23/15 206 lb (93.4 kg)  05/17/15 215 lb 9.6 oz (97.8 kg)    Physical Exam  Constitutional: He appears well-developed and well-nourished. No distress.  Well-appearing, comfortable, cooperative  HENT:  Mouth/Throat: Oropharynx is clear and moist.  Cardiovascular: Normal rate, regular rhythm, normal heart sounds and intact distal pulses.   No murmur heard. Pulmonary/Chest: Effort normal and breath sounds normal. No respiratory distress. He has no wheezes. He has no rales.  Musculoskeletal: He exhibits no edema.  Neurological: He is alert.  Skin: Skin is warm and dry. He is not diaphoretic.  Nursing note and vitals reviewed.  I have personally reviewed the below lab results from 08/23/15  Results for orders placed or performed in visit on 08/23/15  COMPLETE METABOLIC PANEL WITH GFR  Result Value Ref Range   Sodium 140 135 - 146 mmol/L   Potassium 4.4 3.5 - 5.3 mmol/L   Chloride 105 98 - 110 mmol/L   CO2 23 20 - 31 mmol/L   Glucose, Bld 106 (H) 65 - 99 mg/dL   BUN 14 7 - 25 mg/dL   Creat 0.90 0.70 - 1.33 mg/dL   Total Bilirubin 0.6 0.2 - 1.2 mg/dL   Alkaline Phosphatase 84 40 - 115 U/L  AST 23 10 - 35 U/L   ALT 29 9 - 46 U/L   Total Protein 6.6 6.1 - 8.1 g/dL   Albumin 4.2 3.6 - 5.1 g/dL   Calcium 9.6 8.6 - 10.3 mg/dL   GFR, Est African American >89 >=60 mL/min   GFR, Est Non African American >89 >=60 mL/min  Lipid panel  Result Value Ref Range   Cholesterol 163 125 - 200 mg/dL   Triglycerides 108 <150 mg/dL   HDL 62 >=40 mg/dL   Total CHOL/HDL Ratio 2.6 <=5.0 Ratio   VLDL 22 <30 mg/dL   LDL Cholesterol 79 <130 mg/dL  CBC with Differential/Platelet  Result Value Ref Range   WBC 7.6 3.8 - 10.8 K/uL   RBC 5.02 4.20 - 5.80 MIL/uL   Hemoglobin 15.8 13.2  - 17.1 g/dL   HCT 46.1 38.5 - 50.0 %   MCV 91.8 80.0 - 100.0 fL   MCH 31.5 27.0 - 33.0 pg   MCHC 34.3 32.0 - 36.0 g/dL   RDW 13.2 11.0 - 15.0 %   Platelets 192 140 - 400 K/uL   MPV 12.2 7.5 - 12.5 fL   Neutro Abs 4,028 1,500 - 7,800 cells/uL   Lymphs Abs 2,736 850 - 3,900 cells/uL   Monocytes Absolute 532 200 - 950 cells/uL   Eosinophils Absolute 228 15 - 500 cells/uL   Basophils Absolute 76 0 - 200 cells/uL   Neutrophils Relative % 53 %   Lymphocytes Relative 36 %   Monocytes Relative 7 %   Eosinophils Relative 3 %   Basophils Relative 1 %   Smear Review Criteria for review not met   POCT HgB A1C  Result Value Ref Range   Hemoglobin A1C 5.8       Assessment & Plan:   Problem List Items Addressed This Visit    Prediabetes - Primary    Well controlled Pre-Dm, A1c stable at 5.8 with diet and exercise. No medications - discussed metformin previously, has not needed to start yet Continue lifestyle Follow-up 3 months, anticipate A1c should be improved, then can space to q 6 mo with physical and labs      Relevant Orders   COMPLETE METABOLIC PANEL WITH GFR   Hemoglobin A1c   Overweight with body mass index (BMI) 25.0-29.9    Overweight with BMi 29, prior elevated weight. Improved on lifestyle modifications diet / exercise. Risk factor with Pre-DM and HTN well controlled. - Continue modifications - Follow-up weight - Check future labs 08/2016 and f/u A1c in 3 mo      HLD (hyperlipidemia)    Reviewed last lipid panel 08/2015, well controlled on Atorvastatin 9m daily No change today Continue improved lifestyle mods Follow-up 08/2016 for fasting lipid panel, future order      Relevant Orders   Lipid panel   Essential (primary) hypertension    Well controlled No complications Continue Amlodipine-Benazepril 5-165mdaily, with ACE covers for renal protection in Pre-DM patient Future labs CMET in 08/2016      Relevant Orders   COMPLETE METABOLIC PANEL WITH GFR    Other  Visit Diagnoses    Need for influenza vaccination       Relevant Orders   Flu Vaccine QUAD 36+ mos PF IM (Fluarix & Fluzone Quad PF) (Completed)   Screening for HIV (human immunodeficiency virus)       Future HIV screen test for 08/2016   Relevant Orders   HIV antibody      No orders of  the defined types were placed in this encounter.     Follow up plan: Return in about 3 months (around 02/29/2016) for A1c, Pre Diabetes.  Nobie Putnam, [facility] 11/29/2015, 6:21 PM

## 2015-11-29 NOTE — Assessment & Plan Note (Signed)
Overweight with BMi 29, prior elevated weight. Improved on lifestyle modifications diet / exercise. Risk factor with Pre-DM and HTN well controlled. - Continue modifications - Follow-up weight - Check future labs 08/2016 and f/u A1c in 3 mo

## 2016-01-28 ENCOUNTER — Encounter: Payer: Self-pay | Admitting: Family Medicine

## 2016-01-28 ENCOUNTER — Ambulatory Visit
Admission: RE | Admit: 2016-01-28 | Discharge: 2016-01-28 | Disposition: A | Discharge status: Home / Self Care | Payer: 59 | Source: Ambulatory Visit | Attending: Family Medicine | Admitting: Family Medicine

## 2016-01-28 ENCOUNTER — Ambulatory Visit (INDEPENDENT_AMBULATORY_CARE_PROVIDER_SITE_OTHER): Payer: 59 | Admitting: Family Medicine

## 2016-01-28 VITALS — BP 162/89 | HR 85 | Temp 98.9°F | Resp 16 | Ht 72.0 in | Wt 215.0 lb

## 2016-01-28 DIAGNOSIS — J209 Acute bronchitis, unspecified: Secondary | ICD-10-CM

## 2016-01-28 DIAGNOSIS — J019 Acute sinusitis, unspecified: Secondary | ICD-10-CM | POA: Diagnosis not present

## 2016-01-28 DIAGNOSIS — Z87891 Personal history of nicotine dependence: Secondary | ICD-10-CM | POA: Insufficient documentation

## 2016-01-28 MED ORDER — AZITHROMYCIN 250 MG PO TABS: ORAL_TABLET | ORAL | 0 refills | Status: DC

## 2016-01-28 MED ORDER — PREDNISONE 10 MG PO TABS: ORAL_TABLET | ORAL | 0 refills | Status: DC

## 2016-01-28 NOTE — Assessment & Plan Note (Signed)
Former smoker, quit x 2 years now, prior history smoke 1ppd up to 35 years No known history of lung malignancy, no prior screening Interested in low dose chest CT screening for lung CA Follow-up in future when well to discuss further and likely proceed with orders, given meets criteria at age 55 with >30 pack year smoking history

## 2016-01-28 NOTE — Patient Instructions (Signed)
Thank you for coming in to clinic today.  1. It sounds like you had an Upper Respiratory Virus that has settled into a Bronchitis, lower respiratory tract infection. I don't have concerns for pneumonia today, and think that this should gradually improve. Once you are feeling better, the cough may take a few weeks to fully resolve. I do hear wheezing and coarse breath sounds, this may be due to the infection, also could be related to former smoking possible mild COPD start Azithromycin Z pak (antibiotic) 2 tabs day 1, then 1 tab x 4 days, complete entire course even if improved - IF not better in 24-48 hours, Prednisone taper over 6 days - Use Albuterol inhaler 2 puffs every 4-6 hours around the clock for next 2-3 days, max up to 5 days then use as needed - IF still lingering congestion after 1+ week, Start OTC Loratadine (Claritin) 10mg  daily and Flonase 2 sprays in each nostril daily for next 4-6 weeks, then you may stop and use seasonally or as needed - Finish Mucinex for 1 week or less, to help clear the mucus - Use nasal saline (Simply Saline or Ocean Spray) to flush nasal congestion multiple times a day, may help cough - Drink plenty of fluids to improve congestion  Chest X-ray today  In future, we can arrange a Low Dose Chest CT Scan for Lung Cancer Screening  Please schedule a follow-up appointment with Dr. Althea CharonKaramalegos in 1-2 weeks as needed if worsening bronchitis  If you have any other questions or concerns, please feel free to call the clinic or send a message through MyChart. You may also schedule an earlier appointment if necessary.  Saralyn PilarAlexander Paitynn Mikus, DO [facility] [place] [facility]

## 2016-01-28 NOTE — Assessment & Plan Note (Addendum)
Consistent with worsening bronchitis in setting of likely viral URI (suspected +sick contacts), concern with chronic smoking history has had prior worsening bronchitis that sound similar to COPD exac, but no prior confirmation or diagnosis of COPD - Afebrile (but subjective fevers/chills recently), no focal signs of infection (not consistent with pneumonia by history or exam), no evidence sinusitis. Mild exp wheezing and coarse breath sounds on exam concern for some bronchospasm (suspect may have mild underlying COPD  Plan: 1. Check Chest X-ray 2v today - reviewed result, negative without focal infiltrate, masses or any acute concerns. Contacted patient by phone to update on negative results. 2. Start Azithromycin Z-pak dosing 500mg  then 250mg  daily x 4 days 3. If not better in 24-48hours may start Prednisone taper burst 60 to 10mg  over 6 days for bronchospasm, and possible COPD - emphasized risk of inc blood sugar / BP wt gain with this, and side effects more likely on chronic course but ideally would avoid if he is improving 4. Use Albuterol inhaler 2 puffs q 4-6 hour x 3-5 days regularly 5. Consider starting OTC  Loratadine (Claritin) 10mg  daily and Flonase 2 sprays in each nostril daily for next 4-6 weeks, then may stop and use seasonally or as needed. Especially if congestion lingers after main symptoms resolve 6. Finish Mucinex, has Tessalon at home didn't help before 7. Return criteria reviewed, follow-up within 1 week if not improved. Note - consider spirometry in future

## 2016-01-28 NOTE — Progress Notes (Signed)
 Subjective:    Patient ID: Jeremy [name], male    DOB: 05/14/1960, 55 y.o.   MRN: [id number]  Jeremy [name] is a 55 y.o. male presenting on 01/28/2016 for URI  Patient presents for a same day appointment.  HPI  ACUTE BRONCHITIS / Rhinosinusitis: Reports symptoms started about 4 days ago with sore throat and nasal congestion, worsening to post nasal drip, coughing, worsening in past 24 hours, felt subjective feverish with some chills/sweats, muscles sore from coughing, and thicker chest congestion with some change in phlegm color to more brown. - Similar to prior bronchitis - No known sick contacts or positive flu exposure - Denies headache, nausea, vomiting, abdominal pain, diarrhea, hemoptysis, chest pain, dyspnea  Former Smoker / H/o Tobacco Abuse - Reports former smoker about 1ppd for up to 35 years, has remained smoke free for past 2 years and doing better, he is interesting in screening for lung cancer, and would consider CT Chest in future   Social History  Substance Use Topics  . Smoking status: Former Smoker    Packs/day: 1.00    Years: 35.00    Types: Cigarettes    Quit date: 01/06/2014  . Smokeless tobacco: Never Used  . Alcohol use 7.2 oz/week    12 Cans of beer per week    Review of Systems Per HPI unless specifically indicated above     Objective:    BP (!) 162/89 (BP Location: Right Arm, Patient Position: Sitting, Cuff Size: Large)   Pulse 85   Temp 98.9 F (37.2 C) (Oral)   Resp 16   Ht 6' (1.829 m)   Wt 215 lb (97.5 kg)   BMI 29.16 kg/m   Wt Readings from Last 3 Encounters:  01/28/16 215 lb (97.5 kg)  11/29/15 214 lb (97.1 kg)  08/23/15 206 lb (93.4 kg)    Physical Exam  Constitutional: He is oriented to person, place, and time. He appears well-developed and well-nourished. No distress.  Currently ill-appearing but non toxic, comfortable, cooperative  HENT:  Head: Normocephalic and atraumatic.  Frontal / maxillary sinuses non-tender.  Nares patent without purulence or edema. Oropharynx clear without erythema, exudates, edema or asymmetry.  Eyes: Conjunctivae are normal. Right eye exhibits no discharge. Left eye exhibits no discharge.  Cardiovascular: Normal rate, regular rhythm, normal heart sounds and intact distal pulses.   No murmur heard. Pulmonary/Chest: Effort normal. No respiratory distress. He has wheezes (scattered exp wheezes). He exhibits tenderness (reproducible bilateral lower rib chest wall tenderness).  Diffuse mild coarse breath sounds, improve with cough, some reduced air movement, no focal crackles.  Musculoskeletal: He exhibits no edema.  Neurological: He is alert and oriented to person, place, and time.  Skin: Skin is warm and dry. No rash noted. He is not diaphoretic.  Psychiatric: His behavior is normal.  Nursing note and vitals reviewed.   I have personally reviewed the radiology report from 01/27/17 Chest-Xray 2 view.  CLINICAL DATA:  Bronchitis.  Former smoker.  EXAM: CHEST  2 VIEW  COMPARISON:  01/07/2015  FINDINGS: Normal heart size and mediastinal contours. No acute infiltrate or edema. No effusion or pneumothorax. No acute osseous findings.  IMPRESSION: Negative chest.   Electronically Signed   By: Jonathon  Watts M.D.   On: 01/28/2016 12:34   I have personally reviewed the following lab results from 7/17.  Results for orders placed or performed in visit on 08/23/15  COMPLETE METABOLIC PANEL WITH GFR  Result Value Ref Range   Sodium   140 135 - 146 mmol/L   Potassium 4.4 3.5 - 5.3 mmol/L   Chloride 105 98 - 110 mmol/L   CO2 23 20 - 31 mmol/L   Glucose, Bld 106 (H) 65 - 99 mg/dL   BUN 14 7 - 25 mg/dL   Creat 0.90 0.70 - 1.33 mg/dL   Total Bilirubin 0.6 0.2 - 1.2 mg/dL   Alkaline Phosphatase 84 40 - 115 U/L   AST 23 10 - 35 U/L   ALT 29 9 - 46 U/L   Total Protein 6.6 6.1 - 8.1 g/dL   Albumin 4.2 3.6 - 5.1 g/dL   Calcium 9.6 8.6 - 10.3 mg/dL   GFR, Est African  American >89 >=60 mL/min   GFR, Est Non African American >89 >=60 mL/min  Lipid panel  Result Value Ref Range   Cholesterol 163 125 - 200 mg/dL   Triglycerides 108 <150 mg/dL   HDL 62 >=40 mg/dL   Total CHOL/HDL Ratio 2.6 <=5.0 Ratio   VLDL 22 <30 mg/dL   LDL Cholesterol 79 <130 mg/dL  CBC with Differential/Platelet  Result Value Ref Range   WBC 7.6 3.8 - 10.8 K/uL   RBC 5.02 4.20 - 5.80 MIL/uL   Hemoglobin 15.8 13.2 - 17.1 g/dL   HCT 46.1 38.5 - 50.0 %   MCV 91.8 80.0 - 100.0 fL   MCH 31.5 27.0 - 33.0 pg   MCHC 34.3 32.0 - 36.0 g/dL   RDW 13.2 11.0 - 15.0 %   Platelets 192 140 - 400 K/uL   MPV 12.2 7.5 - 12.5 fL   Neutro Abs 4,028 1,500 - 7,800 cells/uL   Lymphs Abs 2,736 850 - 3,900 cells/uL   Monocytes Absolute 532 200 - 950 cells/uL   Eosinophils Absolute 228 15 - 500 cells/uL   Basophils Absolute 76 0 - 200 cells/uL   Neutrophils Relative % 53 %   Lymphocytes Relative 36 %   Monocytes Relative 7 %   Eosinophils Relative 3 %   Basophils Relative 1 %   Smear Review Criteria for review not met   POCT HgB A1C  Result Value Ref Range   Hemoglobin A1C 5.8       Assessment & Plan:   Problem List Items Addressed This Visit    Former smoker    Former smoker, quit x 2 years now, prior history smoke 1ppd up to 35 years No known history of lung malignancy, no prior screening Interested in low dose chest CT screening for lung CA Follow-up in future when well to discuss further and likely proceed with orders, given meets criteria at age 55 with >30 pack year smoking history      Acute bronchitis - Primary    Consistent with worsening bronchitis in setting of likely viral URI (suspected +sick contacts), concern with chronic smoking history has had prior worsening bronchitis that sound similar to COPD exac, but no prior confirmation or diagnosis of COPD - Afebrile (but subjective fevers/chills recently), no focal signs of infection (not consistent with pneumonia by history or  exam), no evidence sinusitis. Mild exp wheezing and coarse breath sounds on exam concern for some bronchospasm (suspect may have mild underlying COPD  Plan: 1. Check Chest X-ray 2v today - reviewed result, negative without focal infiltrate, masses or any acute concerns. Contacted patient by phone to update on negative results. 2. Start Azithromycin Z-pak dosing 500mg then 250mg daily x 4 days 3. If not better in 24-48hours may start Prednisone taper burst 60   to 10mg over 6 days for bronchospasm, and possible COPD - emphasized risk of inc blood sugar / BP wt gain with this, and side effects more likely on chronic course but ideally would avoid if he is improving 4. Use Albuterol inhaler 2 puffs q 4-6 hour x 3-5 days regularly 5. Consider starting OTC  Loratadine (Claritin) 10mg daily and Flonase 2 sprays in each nostril daily for next 4-6 weeks, then may stop and use seasonally or as needed. Especially if congestion lingers after main symptoms resolve 6. Finish Mucinex, has Tessalon at home didn't help before 7. Return criteria reviewed, follow-up within 1 week if not improved. Note - consider spirometry in future      Relevant Medications   predniSONE (DELTASONE) 10 MG tablet   azithromycin (ZITHROMAX Z-PAK) 250 MG tablet   Other Relevant Orders   DG Chest 2 View (Completed)    Other Visit Diagnoses    Acute rhinosinusitis       Likely contributing to current symptoms progression towards bronchitis, treat with antibiotics given risk with possible COPD and cover with loratadine, flonase   Relevant Medications   predniSONE (DELTASONE) 10 MG tablet   azithromycin (ZITHROMAX Z-PAK) 250 MG tablet      Meds ordered this encounter  Medications  . predniSONE (DELTASONE) 10 MG tablet    Sig: Take 6 tabs with breakfast Day 1, 5 tabs Day 2, 4 tabs Day 3, 3 tabs Day 4, 2 tabs Day 5, 1 tab Day 6.    Dispense:  21 tablet    Refill:  0  . azithromycin (ZITHROMAX Z-PAK) 250 MG tablet    Sig: Take 2  tabs (500mg total) on Day 1. Take 1 tab (250mg) daily for next 4 days.    Dispense:  6 tablet    Refill:  0      Follow up plan: Return in about 2 weeks (around 02/11/2016), or if symptoms worsen or fail to improve, for bronchitis.  Alexander Karamalegos, DO [facility] 01/28/2016, 1:04 PM 

## 2016-02-03 ENCOUNTER — Other Ambulatory Visit: Payer: Self-pay | Admitting: *Deleted

## 2016-02-03 ENCOUNTER — Other Ambulatory Visit: Payer: Self-pay | Admitting: Family Medicine

## 2016-02-03 DIAGNOSIS — E785 Hyperlipidemia, unspecified: Secondary | ICD-10-CM

## 2016-02-03 DIAGNOSIS — J209 Acute bronchitis, unspecified: Secondary | ICD-10-CM

## 2016-02-03 MED ORDER — ALBUTEROL SULFATE HFA 108 (90 BASE) MCG/ACT IN AERS: 2.0000 | INHALATION_SPRAY | Freq: Four times a day (QID) | RESPIRATORY_TRACT | 11 refills | Status: DC | PRN

## 2016-02-03 MED ORDER — ATORVASTATIN CALCIUM 20 MG PO TABS: 20.0000 mg | ORAL_TABLET | Freq: Every day | ORAL | 0 refills | Status: DC

## 2016-02-16 ENCOUNTER — Other Ambulatory Visit: Payer: Self-pay | Admitting: Family Medicine

## 2016-02-16 DIAGNOSIS — I10 Essential (primary) hypertension: Secondary | ICD-10-CM

## 2016-02-16 MED ORDER — AMLODIPINE BESY-BENAZEPRIL HCL 5-10 MG PO CAPS: 1.0000 | ORAL_CAPSULE | Freq: Every day | ORAL | 0 refills | Status: DC

## 2016-03-06 ENCOUNTER — Ambulatory Visit: Payer: 59 | Admitting: Family Medicine

## 2016-03-06 ENCOUNTER — Ambulatory Visit (INDEPENDENT_AMBULATORY_CARE_PROVIDER_SITE_OTHER): Payer: 59 | Admitting: Family Medicine

## 2016-03-06 ENCOUNTER — Encounter: Payer: Self-pay | Admitting: Family Medicine

## 2016-03-06 VITALS — BP 128/76 | HR 83 | Temp 98.6°F | Resp 16 | Ht 72.0 in | Wt 216.0 lb

## 2016-03-06 DIAGNOSIS — Z122 Encounter for screening for malignant neoplasm of respiratory organs: Secondary | ICD-10-CM

## 2016-03-06 DIAGNOSIS — Z87891 Personal history of nicotine dependence: Secondary | ICD-10-CM | POA: Diagnosis not present

## 2016-03-06 DIAGNOSIS — I10 Essential (primary) hypertension: Secondary | ICD-10-CM | POA: Diagnosis not present

## 2016-03-06 DIAGNOSIS — R7303 Prediabetes: Secondary | ICD-10-CM

## 2016-03-06 LAB — POCT GLYCOSYLATED HEMOGLOBIN (HGB A1C): Hemoglobin A1C: 5.9

## 2016-03-06 NOTE — Assessment & Plan Note (Signed)
Stable, well controlled Pre-DM, mild inc A1c today 5.9 from 5.8, but essentially stable 5.8 to 6.0 in past 1 year. Never on medication. Lifestyle diet/exercise controlled.  Plan: 1. Discussion today on trend, patient interested in metformin, however still controlled on lifestyle, mutual decision to defer start Metformin for now, can re-consider if A1c >6.0 or if remains 5.8 to 5.9 despite aggressive lifestyle management, would likely start only low dose Metformin 500mg  daily wc at first, counseling on side effects, diarrhea 2. Reassurance stable A1c 3. Continue lifestyle with DM diet, and regular exercise now resuming 4. Follow-up 6 months for Annual Physical, already ordered future fasting labs including A1c (space out to q 6 months if doing well still since stable for 1 year)

## 2016-03-06 NOTE — Assessment & Plan Note (Signed)
Mild elevated initial electronic cuff reading, manual reading after with much improved, more similar to outside readings, 120s/70s at work (done manually, actually checked outside office earlier today) No known complications  Plan: 1. Continue Amlodipine-Benazepril 5-10mg  daily, with ACE for renal protection in Pre-DM 2. Encouraged continue exercise regimen 3. Check BP outside office, record readings, given BP log 4. Follow-up 6 months for Annual Physical, already has future fasting labs ordered

## 2016-03-06 NOTE — Progress Notes (Addendum)
Subjective:    Patient ID: Jeremy [name], male    DOB: 02-08-1960, 56 y.o.   MRN: [id number]  Jeremy [name] is a 56 y.o. male presenting on 03/06/2016 for Diabetes (prediabetes)  HPI  Pre-Diabetes: Reports no specific concerns, overall feels good, he was anticipating slightly elevated A1c today due to limited exercise. Over holidays he had some foods off of his DM diet plan and was not exercise regularly for several weeks. Now he has recently just resumed his regular gym exercise regimen this past weekend CBGs: Does not check CBG Meds: Never started any anti-DM meds (never on metformin but was aware of this medication) Currently on ACEi - tolerating well Lifestyle: Continues improved DM diet with mostly eliminated carbs. Now resumed regular exercise with gym, has trainer, does cardio and strength training, x 4 days weekly up to 1-2 hours per day - Weight gain +1 lb in about >1 month Denies hypoglycemia, polyuria, visual changes, numbness or tingling.  CHRONIC HTN: Reports has been well controlled for several years. Current Meds - Amlodpine-Benzepril  5-10mg  daily Reports good compliance, took meds today. Tolerating well, w/o complaints. Lifestyle - see above Denies CP, dyspnea, HA, edema, dizziness / lightheadedness  Former Smoker / H/o Tobacco Abuse - Reports former smoker about 1ppd for up to 35 years, has remained smoke free for past 2 years and doing better, he is interesting in screening for lung cancer, and would consider Low Dose CT Chest in future, asking about how to proceed with this. He has not checked with ins company yet to check coverage  Health maintenance: - May get shingles vaccine depending on insurance coverage, advised to check with ins first the notify office if wants preventative vaccine  Social History  Substance Use Topics  . Smoking status: Former Smoker    Packs/day: 1.00    Years: 35.00    Types: Cigarettes    Quit date: 01/06/2014  . Smokeless  tobacco: Never Used  . Alcohol use 7.2 oz/week    12 Cans of beer per week    Review of Systems Per HPI unless specifically indicated above     Objective:    BP 128/76 (BP Location: Right Arm, Cuff Size: Normal)   Pulse 83   Temp 98.6 F (37 C) (Oral)   Resp 16   Ht 6' (1.829 m)   Wt 216 lb (98 kg)   BMI 29.29 kg/m   Wt Readings from Last 3 Encounters:  03/06/16 216 lb (98 kg)  01/28/16 215 lb (97.5 kg)  11/29/15 214 lb (97.1 kg)    Physical Exam  Constitutional: He appears well-developed and well-nourished. No distress.  Well-appearing, comfortable, cooperative  HENT:  Head: Normocephalic and atraumatic.  Mouth/Throat: Oropharynx is clear and moist.  Eyes: Conjunctivae are normal.  Cardiovascular: Normal rate, regular rhythm, normal heart sounds and intact distal pulses.   No murmur heard. Pulmonary/Chest: Effort normal and breath sounds normal. No respiratory distress. He has no wheezes. He has no rales.  Musculoskeletal: He exhibits no edema.  Neurological: He is alert.  Skin: Skin is warm and dry. He is not diaphoretic.  Psychiatric: His behavior is normal.  Nursing note and vitals reviewed.  I have personally reviewed the below lab results from 03/06/16  Results for orders placed or performed in visit on 03/06/16  POCT HgB A1C  Result Value Ref Range   Hemoglobin A1C 5.9       Assessment & Plan:   Problem List Items Addressed  This Visit    Prediabetes - Primary    Stable, well controlled Pre-DM, mild inc A1c today 5.9 from 5.8, but essentially stable 5.8 to 6.0 in past 1 year. Never on medication. Lifestyle diet/exercise controlled.  Plan: 1. Discussion today on trend, patient interested in metformin, however still controlled on lifestyle, mutual decision to defer start Metformin for now, can re-consider if A1c >6.0 or if remains 5.8 to 5.9 despite aggressive lifestyle management, would likely start only low dose Metformin 500mg  daily wc at first,  counseling on side effects, diarrhea 2. Reassurance stable A1c 3. Continue lifestyle with DM diet, and regular exercise now resuming 4. Follow-up 6 months for Annual Physical, already ordered future fasting labs including A1c (space out to q 6 months if doing well still since stable for 1 year)      Relevant Orders   POCT HgB A1C (Completed)   Former smoker    Former smoker, quit x 2 years now, prior history smoke 1ppd up to 35 years No known history of lung malignancy, no prior screening Interested in low dose chest CT screening for lung CA, advised patient to contact insurance first to check coverage / cost, and if agrees to continue with screening we will place order and arrange. Most likely would be covered given meets criteria age 56+ and >35 pack year smoker  03/07/16 UPDATE Received notice from patient after speaking with their ins that they can proceed with Low Dose CT Lung CA screen but will require Prior Authorization first. I have placed the order today. Will await further authorization, and once approved then should be scheduled.      Relevant Orders   CT CHEST LUNG CA SCREEN LOW DOSE W/O CM   Essential (primary) hypertension    Mild elevated initial electronic cuff reading, manual reading after with much improved, more similar to outside readings, 120s/70s at work (done manually, actually checked outside office earlier today) No known complications  Plan: 1. Continue Amlodipine-Benazepril 5-10mg  daily, with ACE for renal protection in Pre-DM 2. Encouraged continue exercise regimen 3. Check BP outside office, record readings, given BP log 4. Follow-up 6 months for Annual Physical, already has future fasting labs ordered       Other Visit Diagnoses    Encounter for screening for lung cancer       Relevant Orders   CT CHEST LUNG CA SCREEN LOW DOSE W/O CM      No orders of the defined types were placed in this encounter.     Follow up plan: Return in about 6 months  (around 09/03/2016) for Annual physical.  Saralyn PilarAlexander Kashawna Manzer, DO [facility] 03/07/2016, 5:39 PM

## 2016-03-06 NOTE — Patient Instructions (Addendum)
Thank you for coming in to clinic today.  1. Keep up the great work! A1c 5.9 today, slightly up from 5.8, essentially stable over 1 year - Continue with resumed exercise regimen - Check A1c in 6 months as below - Consider looking to Metformin, likely would do 500mg  daily with food for extra help at next visit if A1c >6.0, dose up to max 1000mg  twice a day, can have some diarrhea or upset stomach as side effect  2. BP and cholesterol are controlled, continue meds without change  3. Low Dose CT Chest scan for Screening Lung Cancer - Contact insurance to notify them of this future test, and find out if covered / cost / etc - Keep us posted, and notify us ONCE you are ready for the order  - Age >55, >35 pack year smoking history, no prior CT Screening of Lungs  Also ask insurance company about Zostavax (Shingles vaccine, age requirement and cost / coverage)  You will be due for FASTING BLOOD WORK (no food or drink after midnight before, only water or coffee without cream/sugar on the morning of)  - Please go ahead and schedule a "Lab Only" visit in the morning at the clinic for lab draw AFTER August 22, 2016, about 1 week prior to your scheduled Annual Physical  Please schedule a follow-up appointment with Dr. Althea CharonKaramalegos in 6 months for Annual Physical (labs including A1c, Pre-Diabetes)  If you have any other questions or concerns, please feel free to call the clinic or send a message through MyChart. You may also schedule an earlier appointment if necessary.  Saralyn PilarAlexander Karamalegos, DO [facility]

## 2016-03-06 NOTE — Assessment & Plan Note (Addendum)
Former smoker, quit x 2 years now, prior history smoke 1ppd up to 35 years No known history of lung malignancy, no prior screening Interested in low dose chest CT screening for lung CA, advised patient to contact insurance first to check coverage / cost, and if agrees to continue with screening we will place order and arrange. Most likely would be covered given meets criteria age 56+ and >35 pack year smoker  03/07/16 UPDATE Received notice from patient after speaking with their ins that they can proceed with Low Dose CT Lung CA screen but will require Prior Authorization first. I have placed the order today. Will await further authorization, and once approved then should be scheduled.

## 2016-03-07 ENCOUNTER — Encounter: Payer: Self-pay | Admitting: Family Medicine

## 2016-03-07 NOTE — Addendum Note (Signed)
Addended by: Smitty CordsKARAMALEGOS, Dene Nazir J on: 03/07/2016 05:40 PM   Modules accepted: Orders

## 2016-03-09 ENCOUNTER — Ambulatory Visit: Payer: 59 | Admitting: Oncology

## 2016-03-09 ENCOUNTER — Telehealth: Payer: Self-pay | Admitting: *Deleted

## 2016-03-09 NOTE — Telephone Encounter (Signed)
Received referral for initial lung cancer screening scan. Contacted patient and obtained smoking history,(former, quit 01/06/14, 35 pack year) as well as answering questions related to screening process. Patient denies signs of lung cancer such as weight loss or hemoptysis. Patient denies comorbidity that would prevent curative treatment if lung cancer were found. Patient is tentatively scheduled for shared decision making visit and CT scan on 03/15/16.

## 2016-03-14 ENCOUNTER — Other Ambulatory Visit: Payer: Self-pay | Admitting: *Deleted

## 2016-03-14 DIAGNOSIS — Z87891 Personal history of nicotine dependence: Secondary | ICD-10-CM

## 2016-03-15 ENCOUNTER — Inpatient Hospital Stay: Payer: 59 | Attending: Oncology | Admitting: Oncology

## 2016-03-15 ENCOUNTER — Ambulatory Visit
Admission: RE | Admit: 2016-03-15 | Discharge: 2016-03-15 | Disposition: A | Discharge status: Home / Self Care | Payer: 59 | Source: Ambulatory Visit | Attending: Oncology | Admitting: Oncology

## 2016-03-15 DIAGNOSIS — R911 Solitary pulmonary nodule: Secondary | ICD-10-CM | POA: Diagnosis not present

## 2016-03-15 DIAGNOSIS — I700 Atherosclerosis of aorta: Secondary | ICD-10-CM | POA: Diagnosis not present

## 2016-03-15 DIAGNOSIS — Z87891 Personal history of nicotine dependence: Secondary | ICD-10-CM

## 2016-03-15 DIAGNOSIS — Z136 Encounter for screening for cardiovascular disorders: Secondary | ICD-10-CM | POA: Diagnosis present

## 2016-03-15 DIAGNOSIS — I2510 Atherosclerotic heart disease of native coronary artery without angina pectoris: Secondary | ICD-10-CM | POA: Insufficient documentation

## 2016-03-15 DIAGNOSIS — Z122 Encounter for screening for malignant neoplasm of respiratory organs: Secondary | ICD-10-CM | POA: Diagnosis not present

## 2016-03-15 NOTE — Progress Notes (Signed)
In accordance with CMS guidelines, patient has met eligibility criteria including age, absence of signs or symptoms of lung cancer.  Social History  Substance Use Topics  . Smoking status: Former Smoker    Packs/day: 1.00    Years: 35.00    Types: Cigarettes    Quit date: 01/06/2014  . Smokeless tobacco: Never Used  . Alcohol use 7.2 oz/week    12 Cans of beer per week     A shared decision-making session was conducted prior to the performance of CT scan. This includes one or more decision aids, includes benefits and harms of screening, follow-up diagnostic testing, over-diagnosis, false positive rate, and total radiation exposure.  Counseling on the importance of adherence to annual lung cancer LDCT screening, impact of co-morbidities, and ability or willingness to undergo diagnosis and treatment is imperative for compliance of the program.  Counseling on the importance of continued smoking cessation for former smokers; the importance of smoking cessation for current smokers, and information about tobacco cessation interventions have been given to patient including [place] and [facility] quit [place] programs.  Written order for lung cancer screening with LDCT has been given to the patient and any and all questions have been answered to the best of my abilities.   Yearly follow up will be coordinated by Burgess Estelle, Thoracic Navigator.  Lucendia Herrlich, NP  03/15/16 3:00 PM

## 2016-03-20 ENCOUNTER — Telehealth: Payer: Self-pay | Admitting: *Deleted

## 2016-03-20 NOTE — Telephone Encounter (Signed)
Notified patient of LDCT lung cancer screening results with recommendation for 6 month follow up imaging. Also notified of incidental finding noted below. Patient verbalizes understanding. Patient has been encouraged to discuss the incidental findings with his PCP. This note will be forwarded to PCP as well.  IMPRESSION: 1. Lung-Rads category 3-S, probably benign findings. Left lower lobe solid pulmonary nodule measuring 7.2 mm in volume derived mean diameter. Short-term follow-up in 6 months is recommended with repeat low-dose chest CT without contrast (please use the following order, "CT CHEST LCS NODULE FOLLOW-UP W/O CM"). 2. The "S" modifier above refers to potentially clinically significant non lung cancer related findings. Specifically, two-vessel coronary atherosclerosis . 3. Aortic atherosclerosis.

## 2016-04-14 ENCOUNTER — Ambulatory Visit (INDEPENDENT_AMBULATORY_CARE_PROVIDER_SITE_OTHER): Payer: 59 | Admitting: Physician Assistant

## 2016-04-14 ENCOUNTER — Encounter: Payer: Self-pay | Admitting: Physician Assistant

## 2016-04-14 VITALS — BP 153/82 | HR 82 | Temp 98.5°F | Resp 16 | Ht 72.0 in | Wt 216.0 lb

## 2016-04-14 DIAGNOSIS — H6982 Other specified disorders of Eustachian tube, left ear: Secondary | ICD-10-CM | POA: Diagnosis not present

## 2016-04-14 DIAGNOSIS — J3089 Other allergic rhinitis: Secondary | ICD-10-CM | POA: Diagnosis not present

## 2016-04-14 DIAGNOSIS — H6992 Unspecified Eustachian tube disorder, left ear: Secondary | ICD-10-CM

## 2016-04-14 MED ORDER — FLUTICASONE PROPIONATE 50 MCG/ACT NA SUSP: 2.0000 | Freq: Every day | NASAL | 6 refills | Status: DC

## 2016-04-14 MED ORDER — CETIRIZINE HCL 10 MG PO TABS: 10.0000 mg | ORAL_TABLET | Freq: Every day | ORAL | 0 refills | Status: DC

## 2016-04-14 MED ORDER — FLUTICASONE PROPIONATE 50 MCG/ACT NA SUSP
2.0000 | Freq: Every day | NASAL | 6 refills | Status: DC
Start: 2016-04-14 — End: 2016-04-14

## 2016-04-14 NOTE — Patient Instructions (Signed)
Eustachian Tube Dysfunction The eustachian tube connects the middle ear to the back of the nose. It regulates air pressure in the middle ear by allowing air to move between the ear and nose. It also helps to drain fluid from the middle ear space. When the eustachian tube does not function properly, air pressure, fluid, or both can build up in the middle ear. Eustachian tube dysfunction can affect one or both ears. What are the causes? This condition happens when the eustachian tube becomes blocked or cannot open normally. This may result from:  Ear infections.  Colds and other upper respiratory infections.  Allergies.  Irritation, such as from cigarette smoke or acid from the stomach coming up into the esophagus (gastroesophageal reflux).  Sudden changes in air pressure, such as from descending in an airplane.  Abnormal growths in the nose or throat, such as nasal polyps, tumors, or enlarged tissue at the back of the throat (adenoids). What increases the risk? This condition may be more likely to develop in people who smoke and people who are overweight. Eustachian tube dysfunction may also be more likely to develop in children, especially children who have:  Certain birth defects of the mouth, such as cleft palate.  Large tonsils and adenoids. What are the signs or symptoms? Symptoms of this condition may include:  A feeling of fullness in the ear.  Ear pain.  Clicking or popping noises in the ear.  Ringing in the ear.  Hearing loss.  Loss of balance. Symptoms may get worse when the air pressure around you changes, such as when you travel to an area of high elevation or fly on an airplane. How is this diagnosed? This condition may be diagnosed based on:  Your symptoms.  A physical exam of your ear, nose, and throat.  Tests, such as those that measure:  The movement of your eardrum (tympanogram).  Your hearing (audiometry). How is this treated? Treatment depends on  the cause and severity of your condition. If your symptoms are mild, you may be able to relieve your symptoms by moving air into ("popping") your ears. If you have symptoms of fluid in your ears, treatment may include:  Decongestants.  Antihistamines.  Nasal sprays or ear drops that contain medicines that reduce swelling (steroids). In some cases, you may need to have a procedure to drain the fluid in your eardrum (myringotomy). In this procedure, a small tube is placed in the eardrum to:  Drain the fluid.  Restore the air in the middle ear space. Follow these instructions at home:  Take over-the-counter and prescription medicines only as told by your health care provider.  Use techniques to help pop your ears as recommended by your health care provider. These may include:  Chewing gum.  Yawning.  Frequent, forceful swallowing.  Closing your mouth, holding your nose closed, and gently blowing as if you are trying to blow air out of your nose.  Do not do any of the following until your health care provider approves:  Travel to high altitudes.  Fly in airplanes.  Work in a pressurized cabin or room.  Scuba dive.  Keep your ears dry. Dry your ears completely after showering or bathing.  Do not smoke.  Keep all follow-up visits as told by your health care provider. This is important. Contact a health care provider if:  Your symptoms do not go away after treatment.  Your symptoms come back after treatment.  You are unable to pop your ears.    You have:  A fever.  Pain in your ear.  Pain in your head or neck.  Fluid draining from your ear.  Your hearing suddenly changes.  You become very dizzy.  You lose your balance. This information is not intended to replace advice given to you by your health care provider. Make sure you discuss any questions you have with your health care provider. Document Released: 02/19/2015 Document Revised: 07/01/2015 Document  Reviewed: 02/11/2014 Elsevier Interactive Patient Education  2017 Elsevier Inc.  

## 2016-04-14 NOTE — Progress Notes (Signed)
   Subjective:    Patient ID: Jeremy [name], male    DOB: 07-05-1960, 56 y.o.   MRN: [id number]  Jeremy BeaversJeffrey E Fries is a 56 y.o. male presenting on 04/14/2016 for Ear Pain (left off and on x 2 weeks.)   HPI   Patient is a 56 y/o man with history of balloon sinuplasty and turbinate reconstruction presenting today with intermittent left ear pain for two weeks. Has had some dizziness and head fullness. No ear discharge or bleeding. Some frontal maxillary sinus congestion but no frontal. Some clear rhinorrhea. No cough, sneezing, fevers. No nausea or vomiting. Some sore throat this morning. No troubles breathing.  Social History  Substance Use Topics  . Smoking status: Former Smoker    Packs/day: 1.00    Years: 35.00    Types: Cigarettes    Quit date: 01/06/2014  . Smokeless tobacco: Never Used  . Alcohol use 7.2 oz/week    12 Cans of beer per week    Review of Systems Per HPI unless specifically indicated above     Objective:    BP (!) 153/82 (BP Location: Right Arm, Patient Position: Sitting, Cuff Size: Large)   Pulse 82   Temp 98.5 F (36.9 C) (Oral)   Resp 16   Ht 6' (1.829 m)   Wt 216 lb (98 kg)   BMI 29.29 kg/m   Wt Readings from Last 3 Encounters:  04/14/16 216 lb (98 kg)  03/15/16 205 lb (93 kg)  03/06/16 216 lb (98 kg)    Physical Exam  Constitutional: He appears well-developed and well-nourished. No distress.  HENT:  Right Ear: Tympanic membrane and external ear normal. Tympanic membrane is not erythematous and not bulging.  Left Ear: External ear normal. Tympanic membrane is not erythematous and not bulging.  Mouth/Throat: No oropharyngeal exudate.  Left Tm opaque.  Cardiovascular: Normal rate and regular rhythm.   Pulmonary/Chest: No respiratory distress. He has no wheezes. He has no rales.   Results for orders placed or performed in visit on 03/06/16  POCT HgB A1C  Result Value Ref Range   Hemoglobin A1C 5.9       Assessment & Plan:   1. Dysfunction  of left eustachian tube  Seems 2/2 allergies, potential congestion from viral URI. Treat as below, should resolve within one week. No infection today.   - cetirizine (ZYRTEC) 10 MG tablet; Take 1 tablet (10 mg total) by mouth daily.  Dispense: 90 tablet; Refill: 0 - fluticasone (FLONASE) 50 MCG/ACT nasal spray; Place 2 sprays into both nostrils daily.  Dispense: 16 g; Refill: 6  2. Acute allergic rhinitis due to other allergen, unspecified seasonality  See above.  - cetirizine (ZYRTEC) 10 MG tablet; Take 1 tablet (10 mg total) by mouth daily.  Dispense: 90 tablet; Refill: 0 - fluticasone (FLONASE) 50 MCG/ACT nasal spray; Place 2 sprays into both nostrils daily.  Dispense: 16 g; Refill: 6    Follow up plan: Return if symptoms worsen or fail to improve.  Osvaldo AngstAdriana Alfonza Toft, PA-C [facility] 04/14/2016, 1:57 PM

## 2016-04-18 ENCOUNTER — Telehealth: Payer: Self-pay | Admitting: Family Medicine

## 2016-04-18 DIAGNOSIS — J0100 Acute maxillary sinusitis, unspecified: Secondary | ICD-10-CM

## 2016-04-18 MED ORDER — AZITHROMYCIN 250 MG PO TABS: ORAL_TABLET | ORAL | 0 refills | Status: DC

## 2016-04-18 MED ORDER — IPRATROPIUM BROMIDE 0.06 % NA SOLN: 2.0000 | Freq: Four times a day (QID) | NASAL | 0 refills | Status: DC

## 2016-04-18 NOTE — Telephone Encounter (Signed)
Reviewed chart from Osvaldo AngstAdriana Pollak PA on 04/14/16, at that time no evidence of bacterial infection, concern for ears and sinuses, thought to be viral URI. Now patient notifies office with worsening symptoms, thicker green sputum / congestion, coughing.  Will phone in antibiotic Azithromycin Z-pak, and continue current Cetirizine, Flonase. Also will sent in Atrovent nasal spray for congestion.  Please notify patient.  Saralyn PilarAlexander Karamalegos, DO [facility] 04/18/2016, 12:25 PM

## 2016-04-18 NOTE — Telephone Encounter (Signed)
Pt informed

## 2016-04-18 NOTE — Telephone Encounter (Signed)
Pt called saying he last Friday.  He says he is pretty sure its turned into a sinus infection with cough and green congestion.  He would like something called in.  [facility].   Please let him know.  Thanks, Barth Kirkseri

## 2016-04-20 ENCOUNTER — Telehealth: Payer: Self-pay | Admitting: *Deleted

## 2016-04-20 NOTE — Telephone Encounter (Signed)
Contacted patient to see if had additional medication ins card. Patient did not want to proceed with PA for otc zyrtec. Patient purchased otc and actually had some on hand. PA will be deleted.[place]

## 2016-04-23 ENCOUNTER — Other Ambulatory Visit: Payer: Self-pay | Admitting: Family Medicine

## 2016-04-23 DIAGNOSIS — E785 Hyperlipidemia, unspecified: Secondary | ICD-10-CM

## 2016-05-06 ENCOUNTER — Other Ambulatory Visit: Payer: Self-pay | Admitting: Family Medicine

## 2016-05-06 DIAGNOSIS — I10 Essential (primary) hypertension: Secondary | ICD-10-CM

## 2016-08-23 ENCOUNTER — Other Ambulatory Visit: Payer: Self-pay

## 2016-08-23 DIAGNOSIS — K219 Gastro-esophageal reflux disease without esophagitis: Secondary | ICD-10-CM

## 2016-08-23 MED ORDER — RANITIDINE HCL 150 MG PO TABS: 150.0000 mg | ORAL_TABLET | Freq: Two times a day (BID) | ORAL | 3 refills | Status: DC

## 2016-08-23 NOTE — Telephone Encounter (Signed)
Refilled Zantac, will discuss in office

## 2016-08-24 ENCOUNTER — Other Ambulatory Visit: Payer: 59

## 2016-08-24 DIAGNOSIS — I10 Essential (primary) hypertension: Secondary | ICD-10-CM

## 2016-08-24 DIAGNOSIS — R7303 Prediabetes: Secondary | ICD-10-CM

## 2016-08-24 DIAGNOSIS — Z114 Encounter for screening for human immunodeficiency virus [HIV]: Secondary | ICD-10-CM

## 2016-08-24 DIAGNOSIS — E782 Mixed hyperlipidemia: Secondary | ICD-10-CM

## 2016-08-25 ENCOUNTER — Other Ambulatory Visit: Payer: Self-pay

## 2016-08-25 DIAGNOSIS — K219 Gastro-esophageal reflux disease without esophagitis: Secondary | ICD-10-CM

## 2016-08-25 LAB — COMPLETE METABOLIC PANEL WITH GFR
ALT: 35 U/L (ref 9–46)
AST: 27 U/L (ref 10–35)
Albumin: 4 g/dL (ref 3.6–5.1)
Alkaline Phosphatase: 74 U/L (ref 40–115)
BILIRUBIN TOTAL: 0.7 mg/dL (ref 0.2–1.2)
BUN: 13 mg/dL (ref 7–25)
CO2: 19 mmol/L — AB (ref 20–31)
CREATININE: 0.82 mg/dL (ref 0.70–1.33)
Calcium: 9.5 mg/dL (ref 8.6–10.3)
Chloride: 106 mmol/L (ref 98–110)
GFR, Est African American: >89 mL/min (ref >=60)
GLUCOSE: 113 mg/dL — AB (ref 65–99)
Potassium: 4.5 mmol/L (ref 3.5–5.3)
Sodium: 141 mmol/L (ref 135–146)
TOTAL PROTEIN: 6.6 g/dL (ref 6.1–8.1)

## 2016-08-25 LAB — LIPID PANEL
Cholesterol: 189 mg/dL (ref <200)
HDL: 60 mg/dL (ref >40)
LDL CALC: 96 mg/dL (ref <100)
Total CHOL/HDL Ratio: 3.2 Ratio (ref <5.0)
Triglycerides: 166 mg/dL — ABNORMAL HIGH (ref <150)
VLDL: 33 mg/dL — AB (ref <30)

## 2016-08-25 LAB — HEMOGLOBIN A1C
HEMOGLOBIN A1C: 5.7 % — AB (ref <5.7)
Mean Plasma Glucose: 117 mg/dL

## 2016-08-25 LAB — HIV ANTIBODY (ROUTINE TESTING W REFLEX): HIV 1&2 Ab, 4th Generation: NONREACTIVE

## 2016-08-25 MED ORDER — RANITIDINE HCL 150 MG PO TABS: 150.0000 mg | ORAL_TABLET | Freq: Two times a day (BID) | ORAL | 1 refills | Status: DC

## 2016-08-31 ENCOUNTER — Encounter: Payer: Self-pay | Admitting: Family Medicine

## 2016-08-31 ENCOUNTER — Ambulatory Visit (INDEPENDENT_AMBULATORY_CARE_PROVIDER_SITE_OTHER): Payer: 59 | Admitting: Family Medicine

## 2016-08-31 VITALS — BP 124/78 | HR 85 | Temp 98.6°F | Resp 16 | Ht 72.0 in | Wt 217.6 lb

## 2016-08-31 DIAGNOSIS — E782 Mixed hyperlipidemia: Secondary | ICD-10-CM

## 2016-08-31 DIAGNOSIS — Z0000 Encounter for general adult medical examination without abnormal findings: Secondary | ICD-10-CM

## 2016-08-31 DIAGNOSIS — I10 Essential (primary) hypertension: Secondary | ICD-10-CM | POA: Diagnosis not present

## 2016-08-31 DIAGNOSIS — R7303 Prediabetes: Secondary | ICD-10-CM | POA: Diagnosis not present

## 2016-08-31 MED ORDER — METFORMIN HCL 500 MG PO TABS: 500.0000 mg | ORAL_TABLET | Freq: Every day | ORAL | 3 refills | Status: DC

## 2016-08-31 NOTE — Progress Notes (Signed)
Subjective:    Patient ID: Jeremy [name], male    DOB: January 28, 1961, 56 y.o.   MRN: [id number]  Jeremy [name] is a 56 y.o. male presenting on 08/31/2016 for Annual Exam   HPI   Here for Annual Physical Exam, and Lab Review.  Pre-Diabetes: Concern that his A1c is still in Pre-DM range, but it is improved 5.9 to 5.7 CBGs: He has glucometer to check CBG, he had DM supplies for his cat, but can use the glucometer for himself, states his cat has passed. Randomly checks CBG not aware to check >2 hr post prandial has variable readings. Meds: None (interested in starting Metformin to "control Pre-Diabetes") Currently on ACEi - tolerating well Lifestyle: - Diet: Improved diet with less carbs - Exercise: Improved regular exercise at gym - Weight gain +1 lb in 1 month Denies hypoglycemia, polyuria, visual changes, numbness or tingling.  HYPERLIPIDEMIA: - Reports no concerns. Last lipid panel 7.2018, controlled except mild elevated TG - Currently taking Atorvastatin 20mg , tolerating well without side effects or myalgias  CHRONIC HTN: Reports no new concerns. Checking BP outside office, normal. Current Meds - Amlodipine-Benazepril 5-10mg  daily   Reports good compliance, took meds today. Tolerating well, w/o complaints.   No past medical history on file. No past surgical history on file. Social History   Social History  . Marital status: Single    Spouse name: N/A  . Number of children: N/A  . Years of education: N/A   Occupational History  . Not on file.   Social History Main Topics  . Smoking status: Former Smoker    Packs/day: 1.00    Years: 35.00    Types: Cigarettes    Quit date: 01/06/2014  . Smokeless tobacco: Former NeurosurgeonUser  . Alcohol use 7.2 oz/week    12 Cans of beer per week  . Drug use: No  . Sexual activity: Not on file   Other Topics Concern  . Not on file   Social History Narrative  . No narrative on file   Family History  Problem Relation Age of  Onset  . Cancer Mother        breast  . Clotting disorder Father   . Heart attack Father   . Cancer Brother        pancreatic  . Heart attack Brother    Current Outpatient Prescriptions on File Prior to Visit  Medication Sig  . amLODipine-benazepril (LOTREL) 5-10 MG capsule TAKE 1 CAPSULE BY MOUTH  DAILY  . atorvastatin (LIPITOR) 20 MG tablet TAKE 1 TABLET BY MOUTH  DAILY  . Multiple Vitamin (MULTIVITAMIN) tablet Take 1 tablet by mouth daily. Men's a day  . cetirizine (ZYRTEC) 10 MG tablet Take 1 tablet (10 mg total) by mouth daily.  . fluticasone (FLONASE) 50 MCG/ACT nasal spray Place 2 sprays into both nostrils daily. (Patient not taking: Reported on 08/31/2016)  . ranitidine (ZANTAC) 150 MG tablet Take 1 tablet (150 mg total) by mouth 2 (two) times daily. (Patient not taking: Reported on 08/31/2016)   No current facility-administered medications on file prior to visit.     Review of Systems  Constitutional: Negative for activity change, appetite change, chills, diaphoresis, fatigue and fever.  HENT: Negative for congestion, hearing loss and sinus pressure.   Eyes: Negative for visual disturbance.  Respiratory: Negative for cough, chest tightness, shortness of breath and wheezing.   Cardiovascular: Negative for chest pain, palpitations and leg swelling.  Gastrointestinal: Negative for abdominal pain, anal bleeding,  blood in stool, constipation, diarrhea, nausea and vomiting.  Endocrine: Negative for cold intolerance.  Genitourinary: Negative for decreased urine volume, difficulty urinating, dysuria, frequency, hematuria and testicular pain.  Musculoskeletal: Negative for arthralgias and neck pain.  Skin: Negative for rash.  Allergic/Immunologic: Negative for environmental allergies.  Neurological: Negative for dizziness, weakness, light-headedness, numbness and headaches.  Hematological: Negative for adenopathy.  Psychiatric/Behavioral: Negative for behavioral problems, dysphoric  mood and sleep disturbance. The patient is not nervous/anxious.    Per HPI unless specifically indicated above     Objective:    BP 124/78 (BP Location: Right Arm, Patient Position: Sitting, Cuff Size: Normal)   Pulse 85   Temp 98.6 F (37 C) (Oral)   Resp 16   Ht 6' (1.829 m)   Wt 217 lb 9.6 oz (98.7 kg)   BMI 29.51 kg/m   Wt Readings from Last 3 Encounters:  08/31/16 217 lb 9.6 oz (98.7 kg)  04/14/16 216 lb (98 kg)  03/15/16 205 lb (93 kg)    Physical Exam  Constitutional: He is oriented to person, place, and time. He appears well-developed and well-nourished. No distress.  Well-appearing, comfortable, cooperative  HENT:  Head: Normocephalic and atraumatic.  Mouth/Throat: Oropharynx is clear and moist.  Eyes: Pupils are equal, round, and reactive to light. Conjunctivae and EOM are normal. Right eye exhibits no discharge. Left eye exhibits no discharge.  Neck: Normal range of motion. Neck supple. No thyromegaly present.  Cardiovascular: Normal rate, regular rhythm, normal heart sounds and intact distal pulses.   No murmur heard. Pulmonary/Chest: Effort normal and breath sounds normal. No respiratory distress. He has no wheezes. He has no rales.  Abdominal: Soft. Bowel sounds are normal. He exhibits no distension and no mass. There is no tenderness.  Musculoskeletal: Normal range of motion. He exhibits no edema or tenderness.  Upper / Lower Extremities: - Normal muscle tone, strength bilateral upper extremities 5/5, lower extremities 5/5  Lymphadenopathy:    He has no cervical adenopathy.  Neurological: He is alert and oriented to person, place, and time.  Distal sensation intact to light touch all extremities  Skin: Skin is warm and dry. No rash noted. He is not diaphoretic. No erythema.  Psychiatric: He has a normal mood and affect. His behavior is normal.  Well groomed, good eye contact, normal speech and thoughts  Nursing note and vitals reviewed.  Results for orders  placed or performed in visit on 08/24/16  COMPLETE METABOLIC PANEL WITH GFR  Result Value Ref Range   Sodium 141 135 - 146 mmol/L   Potassium 4.5 3.5 - 5.3 mmol/L   Chloride 106 98 - 110 mmol/L   CO2 19 (L) 20 - 31 mmol/L   Glucose, Bld 113 (H) 65 - 99 mg/dL   BUN 13 7 - 25 mg/dL   Creat 1.61 0.96 - 0.45 mg/dL   Total Bilirubin 0.7 0.2 - 1.2 mg/dL   Alkaline Phosphatase 74 40 - 115 U/L   AST 27 10 - 35 U/L   ALT 35 9 - 46 U/L   Total Protein 6.6 6.1 - 8.1 g/dL   Albumin 4.0 3.6 - 5.1 g/dL   Calcium 9.5 8.6 - 40.9 mg/dL   GFR, Est African American >89 >=60 mL/min   GFR, Est Non African American >89 >=60 mL/min  Lipid panel  Result Value Ref Range   Cholesterol 189 <200 mg/dL   Triglycerides 811 (H) <150 mg/dL   HDL 60 >91 mg/dL   Total CHOL/HDL Ratio  3.2 <5.0 Ratio   VLDL 33 (H) <30 mg/dL   LDL Cholesterol 96 <161<100 mg/dL  Hemoglobin [id number]  Result Value Ref Range   Hgb A1c MFr Bld 5.7 (H) <5.7 %   Mean Plasma Glucose 117 mg/dL  HIV antibody  Result Value Ref Range   HIV 1&2 Ab, 4th Generation NONREACTIVE NONREACTIVE    Recent Labs  03/06/16 1331 08/24/16 0847  HGBA1C 5.9 5.7*       Assessment & Plan:   Problem List Items Addressed This Visit    Prediabetes    Well-controlled Pre-DM with A1c 5.7 (improved from 5.9) Concern with HTN, HLD/TG  Plan:  1. Not on any therapy currently - discussion on options and patient elects for more aggressive treatment and would like to start Metformin low dose at this time to control PreDM - Start Metformin 500mg  nightly 2. Encourage improved lifestyle - low carb, low sugar diet, reduce portion size, continue improving regular exercise 3. Follow-up 6 months Pre-DM A1c POC      Relevant Medications   metFORMIN (GLUCOPHAGE) 500 MG tablet   Hyperlipidemia    Controlled cholesterol except mild inc TG on statin and improving lifestyle Last lipid panel 08/2016 Calculated ASCVD 10 yr risk score 4.4%  Plan: 1. Continue current meds -  Atorvastatin 20mg  daily - no change 2. May consider in future ASA 81mg  for primary ASCVD risk reduction 3. Encourage improved lifestyle - low carb/cholesterol, reduce portion size, continue improving regular exercise 4. Follow-up q 6 month, likely lipids only yearly at this time      Essential (primary) hypertension    Well-controlled HTN - Home BP readings normal  No known complications    Plan:  1. Continue current BP regimen Amlodipine-Benazepril 5-10mg  daily  2. Encourage improved lifestyle - low sodium diet, regular exercise 3. Continue monitor BP outside office, bring readings to next visit, if persistently >140/90 or new symptoms notify office sooner 4. Follow-up q 6 mo =       Other Visit Diagnoses    Annual physical exam    -  Primary      Meds ordered this encounter  Medications  . metFORMIN (GLUCOPHAGE) 500 MG tablet    Sig: Take 1 tablet (500 mg total) by mouth daily before supper.    Dispense:  90 tablet    Refill:  3    Follow up plan: Return in about 6 months (around 03/03/2017) for Pre-Diabetes A1c.  Saralyn PilarAlexander Karamalegos, DO [facility] 08/31/2016, 5:14 PM

## 2016-08-31 NOTE — Assessment & Plan Note (Signed)
Well-controlled Pre-DM with A1c 5.7 (improved from 5.9) Concern with HTN, HLD/TG  Plan:  1. Not on any therapy currently - discussion on options and patient elects for more aggressive treatment and would like to start Metformin low dose at this time to control PreDM - Start Metformin 500mg  nightly 2. Encourage improved lifestyle - low carb, low sugar diet, reduce portion size, continue improving regular exercise 3. Follow-up 6 months Pre-DM A1c POC

## 2016-08-31 NOTE — Assessment & Plan Note (Signed)
Controlled cholesterol except mild inc TG on statin and improving lifestyle Last lipid panel 08/2016 Calculated ASCVD 10 yr risk score 4.4%  Plan: 1. Continue current meds - Atorvastatin 20mg  daily - no change 2. May consider in future ASA 81mg  for primary ASCVD risk reduction 3. Encourage improved lifestyle - low carb/cholesterol, reduce portion size, continue improving regular exercise 4. Follow-up q 6 month, likely lipids only yearly at this time

## 2016-08-31 NOTE — Assessment & Plan Note (Signed)
Well-controlled HTN - Home BP readings normal  No known complications    Plan:  1. Continue current BP regimen Amlodipine-Benazepril 5-10mg  daily  2. Encourage improved lifestyle - low sodium diet, regular exercise 3. Continue monitor BP outside office, bring readings to next visit, if persistently >140/90 or new symptoms notify office sooner 4. Follow-up q 6 mo =

## 2016-08-31 NOTE — Patient Instructions (Signed)
Thank you for coming to the clinic today.  1. Keep up the good work - Start Metformin at dinner once daily, likely take this for 6-12 months and discuss further  Continue other meds, let me know if need new refill  Please schedule a Follow-up Appointment to: Return in about 6 months (around 03/03/2017) for Pre-Diabetes A1c.  If you have any other questions or concerns, please feel free to call the clinic or send a message through MyChart. You may also schedule an earlier appointment if necessary.  Additionally, you may be receiving a survey about your experience at our clinic within a few days to 1 week by e-mail or mail. We value your feedback.  Saralyn PilarAlexander Karamalegos, DO [facility]

## 2016-09-11 ENCOUNTER — Telehealth: Payer: Self-pay | Admitting: *Deleted

## 2016-09-11 NOTE — Telephone Encounter (Signed)
Left message for patient to notify them that it is time to schedule low dose lung cancer screening CT scan. Instructed patient to call back to verify information prior to the scan being scheduled.  

## 2016-09-12 ENCOUNTER — Telehealth: Payer: Self-pay | Admitting: *Deleted

## 2016-09-12 DIAGNOSIS — R9389 Abnormal findings on diagnostic imaging of other specified body structures: Secondary | ICD-10-CM

## 2016-09-12 NOTE — Telephone Encounter (Signed)
Notified patient that lung cancer screening low dose CT scan follow up is due currently or will be in near future. Confirmed that patient is within the age range of 55-77, and asymptomatic, (no signs or symptoms of lung cancer). Patient denies illness that would prevent curative treatment for lung cancer if found. Verified smoking history, (former, quit 01/06/14, 35 pack year). The shared decision making visit was done 03/15/16. Patient is agreeable for CT scan being scheduled.

## 2016-09-14 ENCOUNTER — Encounter: Payer: 59 | Admitting: Family Medicine

## 2016-09-14 ENCOUNTER — Encounter: Payer: Self-pay | Admitting: Family Medicine

## 2016-09-14 DIAGNOSIS — R7303 Prediabetes: Secondary | ICD-10-CM

## 2016-09-18 ENCOUNTER — Ambulatory Visit
Admission: RE | Admit: 2016-09-18 | Discharge: 2016-09-18 | Disposition: A | Discharge status: Home / Self Care | Payer: 59 | Source: Ambulatory Visit | Attending: Oncology | Admitting: Oncology

## 2016-09-18 DIAGNOSIS — I2510 Atherosclerotic heart disease of native coronary artery without angina pectoris: Secondary | ICD-10-CM | POA: Insufficient documentation

## 2016-09-18 DIAGNOSIS — I700 Atherosclerosis of aorta: Secondary | ICD-10-CM | POA: Insufficient documentation

## 2016-09-18 DIAGNOSIS — Z122 Encounter for screening for malignant neoplasm of respiratory organs: Secondary | ICD-10-CM | POA: Insufficient documentation

## 2016-09-18 DIAGNOSIS — R938 Abnormal findings on diagnostic imaging of other specified body structures: Secondary | ICD-10-CM | POA: Diagnosis present

## 2016-09-18 DIAGNOSIS — R9389 Abnormal findings on diagnostic imaging of other specified body structures: Secondary | ICD-10-CM

## 2016-09-18 MED ORDER — METFORMIN HCL ER 500 MG PO TB24: 500.0000 mg | ORAL_TABLET | Freq: Every day | ORAL | 3 refills | Status: DC

## 2016-09-18 NOTE — Addendum Note (Signed)
Addended by: Smitty CordsKARAMALEGOS, Alvira Hecht J on: 09/18/2016 12:51 PM   Modules accepted: Orders

## 2016-09-21 ENCOUNTER — Telehealth: Payer: Self-pay | Admitting: *Deleted

## 2016-09-21 NOTE — Telephone Encounter (Signed)
Notified patient of LDCT lung cancer screening program results with recommendation for 12 month follow up imaging. Also notified of incidental findings noted below and is encouraged to discuss further with PCP who will receive a copy of this note and/or the CT report. Patient verbalizes understanding.   IMPRESSION: 1. Lung-RADS 2, benign appearance or behavior. Continue annual screening with low-dose chest CT without contrast in 12 months. 2. Aortic Atherosclerosis (ICD10-I70.0). Coronary artery calcification noted.

## 2016-11-10 ENCOUNTER — Ambulatory Visit (INDEPENDENT_AMBULATORY_CARE_PROVIDER_SITE_OTHER): Payer: 59

## 2016-11-10 DIAGNOSIS — Z23 Encounter for immunization: Secondary | ICD-10-CM | POA: Diagnosis not present

## 2017-01-17 ENCOUNTER — Ambulatory Visit (INDEPENDENT_AMBULATORY_CARE_PROVIDER_SITE_OTHER): Payer: 59 | Admitting: Family Medicine

## 2017-01-17 ENCOUNTER — Encounter: Payer: Self-pay | Admitting: Family Medicine

## 2017-01-17 VITALS — BP 130/80 | HR 80 | Temp 98.4°F | Resp 16 | Ht 72.0 in | Wt 212.0 lb

## 2017-01-17 DIAGNOSIS — H6982 Other specified disorders of Eustachian tube, left ear: Secondary | ICD-10-CM

## 2017-01-17 DIAGNOSIS — H65192 Other acute nonsuppurative otitis media, left ear: Secondary | ICD-10-CM | POA: Diagnosis not present

## 2017-01-17 DIAGNOSIS — M9900 Segmental and somatic dysfunction of head region: Secondary | ICD-10-CM | POA: Diagnosis not present

## 2017-01-17 DIAGNOSIS — J302 Other seasonal allergic rhinitis: Secondary | ICD-10-CM

## 2017-01-17 MED ORDER — FLUTICASONE PROPIONATE 50 MCG/ACT NA SUSP: 2.0000 | Freq: Every day | NASAL | 3 refills | Status: DC

## 2017-01-17 NOTE — Patient Instructions (Addendum)
Thank you for coming to the clinic today.  1. You have some Eustachian Tube Dysfunction, this problem is usually caused by some deeper sinus swelling and pressure, causing difficulty of eustachian tubes to clear fluid from behind ear drum. You can have ear pain, pressure, fullness, loss of hearing. Often related to sinus symptoms and sometimes with sinusitis or infection or allergy symptoms.  Treatment: - Start using nasal steroid spray, Flonase 2 sprays in each nostril every day for at least 4-6 weeks, and maybe longer - Start OTC allergy medicine (Claritin, Zyrtec, or Allegra - or generics) once daily - For significant ear/sinus pressure, try OTC decongestant such as Phenylephrine or similar medicines, included in DayQuil, take this for up to 1 week or less, no longer - AVOID Afrin nasal spray, if you use this do not use more than 3 days or can make sinus swelling worse  Look into "Galbreath Technique" for ear effusion, can do this simple massage 1-2 x daily for few days then stop. And use as needed. May also use a warm moist wash cloth to help loosen up this tissue before or after doing this to help open up eustachian tube.  If any significant worsening, loss of hearing, constant pain, fever/chills, or concern for infection - notify office and we can send in an antibiotic. Or if just persistent pressure that is not improving, you may contact me back within 1 week and we can consider a brief course of oral steroid prednisone for 3 days only to help reduce swelling, as discussed this is not ideal treatment and can cause side effects.  Please schedule a Follow-up Appointment to: Return if symptoms worsen or fail to improve, for keep apt 1/29, return for ear if worse.  If you have any other questions or concerns, please feel free to call the clinic or send a message through MyChart. You may also schedule an earlier appointment if necessary.  Additionally, you may be receiving a survey about your  experience at our clinic within a few days to 1 week by e-mail or mail. We value your feedback.  Saralyn PilarAlexander Bethanne Mule, DO [facility]

## 2017-01-17 NOTE — Progress Notes (Signed)
Subjective:    Patient ID: Jeremy [name], male    DOB: Jul 19, 1960, 56 y.o.   MRN: [id number]  Jeremy [name] is a 56 y.o. male presenting on 01/17/2017 for Ear Pain (onset 2 week left side getting worst)  HPI   LEFT EAR PAIN / Allergc Rhinosinusitis / Environmental Allergies Reports left sided ear pain over past 2 weeks, with gradual worsening, seems to be mostly constant aching pain, some intermittent worsening, he uses baby Q-tips to clean ears out often. No problems with wax recently. Has tried small amount drops of peroxide to help "dry out ear" and help it "pop" has helped before. No relief. - No longer taking Cetirizine 10mg  daily OTC or Flonase, was on these back in Spring, uses seasonally for allergies only - No sick contacts - History of prior nasal injury with septoplasty and turbinate reduction per Dr [facility] in 2009 - Admits left ear pressure - Denies nasal or sinus congestion, R ear pain pain or pressure, sore throat, cough, fever chills, dizziness, hearing loss, numbness, weakness tingling, headache  Depression screen [facility] 2/9 04/14/2016 07/21/2014  Decreased Interest 0 0  Down, Depressed, Hopeless 0 0  PHQ - 2 Score 0 0    Social History   Tobacco Use  . Smoking status: Former Smoker    Packs/day: 1.00    Years: 35.00    Pack years: 35.00    Types: Cigarettes    Last attempt to quit: 01/06/2014    Years since quitting: 3.0  . Smokeless tobacco: Former Engineer, water Use Topics  . Alcohol use: Yes    Alcohol/week: 7.2 oz    Types: 12 Cans of beer per week  . Drug use: No    Review of Systems Per HPI unless specifically indicated above     Objective:    BP 130/80   Pulse 80   Temp 98.4 F (36.9 C) (Oral)   Resp 16   Ht 6' (1.829 m)   Wt 212 lb (96.2 kg)   SpO2 97%   BMI 28.75 kg/m   Wt Readings from Last 3 Encounters:  01/17/17 212 lb (96.2 kg)  08/31/16 217 lb 9.6 oz (98.7 kg)  04/14/16 216 lb (98 kg)    Physical Exam  Constitutional:  He is oriented to person, place, and time. He appears well-developed and well-nourished. No distress.  Well-appearing, comfortable, cooperative  HENT:  Head: Normocephalic and atraumatic.  Mouth/Throat: Oropharynx is clear and moist.  Frontal / maxillary sinuses non-tender. Nares mostly patent R with some deeper tissue edema s/p turbinate reduction already and history of septoplasty, without purulence.  L TM with some mild to moderate clear effusion and fullness to bulging. No erythema, no purulence. No cerumen and no abnormality within external ear canal. Non tender over tragus or mastoid.  R TM only mild clear effusion fullness without bulging. No erythema or other complications as above.  Oropharynx clear without erythema, exudates, edema or asymmetry.  Eyes: Conjunctivae are normal. Right eye exhibits no discharge. Left eye exhibits no discharge.  Neck: Normal range of motion. Neck supple.  Cardiovascular: Normal rate, regular rhythm, normal heart sounds and intact distal pulses.  No murmur heard. Pulmonary/Chest: Effort normal and breath sounds normal. No respiratory distress. He has no wheezes. He has no rales.  Musculoskeletal: He exhibits no edema.  Lymphadenopathy:    He has no cervical adenopathy.  Neurological: He is alert and oriented to person, place, and time.  Skin: Skin is  warm and dry. No rash noted. He is not diaphoretic. No erythema.  Psychiatric: His behavior is normal.  Nursing note and vitals reviewed.  Results for orders placed or performed in visit on 08/24/16  COMPLETE METABOLIC PANEL WITH GFR  Result Value Ref Range   Sodium 141 135 - 146 mmol/L   Potassium 4.5 3.5 - 5.3 mmol/L   Chloride 106 98 - 110 mmol/L   CO2 19 (L) 20 - 31 mmol/L   Glucose, Bld 113 (H) 65 - 99 mg/dL   BUN 13 7 - 25 mg/dL   Creat 9.600.82 4.540.70 - 0.981.33 mg/dL   Total Bilirubin 0.7 0.2 - 1.2 mg/dL   Alkaline Phosphatase 74 40 - 115 U/L   AST 27 10 - 35 U/L   ALT 35 9 - 46 U/L   Total  Protein 6.6 6.1 - 8.1 g/dL   Albumin 4.0 3.6 - 5.1 g/dL   Calcium 9.5 8.6 - 11.910.3 mg/dL   GFR, Est African American >89 >=60 mL/min   GFR, Est Non African American >89 >=60 mL/min  Lipid panel  Result Value Ref Range   Cholesterol 189 <200 mg/dL   Triglycerides 147166 (H) <150 mg/dL   HDL 60 >82>40 mg/dL   Total CHOL/HDL Ratio 3.2 <5.0 Ratio   VLDL 33 (H) <30 mg/dL   LDL Cholesterol 96 <956<100 mg/dL  Hemoglobin O1HA1c  Result Value Ref Range   Hgb A1c MFr Bld 5.7 (H) <5.7 %   Mean Plasma Glucose 117 mg/dL  HIV antibody  Result Value Ref Range   HIV 1&2 Ab, 4th Generation NONREACTIVE NONREACTIVE      Assessment & Plan:   Problem List Items Addressed This Visit    Seasonal allergies - Restart anti-histamine cetirizine and flonase, likely contributing to current effusion/eustachian tube dysfunction    Relevant Medications   fluticasone (FLONASE) 50 MCG/ACT nasal spray    Other Visit Diagnoses    Eustachian tube dysfunction, left    -  Primary  Subacute oL ear eustachian tube dysfunction with secondary b/l ear effusion, without loss of hearing or evidence of AOM or sinusitis. Likely related allergic rhinosinusitis based on exam and history. - Inadequate conservative therapy, no longer on anti-histamines   Plan: 1. RESUME (new rx sent) Flonase 2 sprays each nare daily for up to 4-6 weeks or longer 2. RESUME OTC oral anti-histamine daily Cetirizine 10mg  daily 3. Recommend may consider trial OTC oral decongestant for up to 1 week or less 4. AVOID Afrin nasal decongestant, counseled if uses, max dose up to 3 days or less, avoid rebound rhinitis 5. Performed Galbreath Technique OMT today, L head/neck external ear and mandibular region to manually help release eustachian tube and promote drainage, discussed, consented, tolerated technique well with mild improvement - Advised can use warm moist heat in his area and may try this technique as demonstrated 1-2x daily 2-3 days then stop, avoid  over-use 6. Additionally discussed potential benefit of future steroid burst short course if needed and not improving 7. Follow-up if not improved or worsening, return criteria    Relevant Medications   fluticasone (FLONASE) 50 MCG/ACT nasal spray   Acute effusion of left ear           Meds ordered this encounter  Medications  . fluticasone (FLONASE) 50 MCG/ACT nasal spray    Sig: Place 2 sprays into both nostrils daily. Use for 4-6 weeks then stop and use seasonally or as needed.    Dispense:  16 g  Refill:  3    Follow up plan: Return if symptoms worsen or fail to improve, for keep apt 1/29, return for ear if worse.  Saralyn PilarAlexander Keilin Gamboa, DO [facility] 01/17/2017, 5:51 PM

## 2017-02-09 ENCOUNTER — Encounter: Payer: Self-pay | Admitting: Family Medicine

## 2017-02-12 ENCOUNTER — Telehealth: Payer: Self-pay

## 2017-02-12 NOTE — Telephone Encounter (Signed)
Attempted to contact the pt to f/u with him concerning his mychart message, no answer. LMOM to return my call.

## 2017-03-06 ENCOUNTER — Other Ambulatory Visit: Payer: Self-pay | Admitting: Family Medicine

## 2017-03-06 ENCOUNTER — Encounter: Payer: Self-pay | Admitting: Family Medicine

## 2017-03-06 ENCOUNTER — Ambulatory Visit (INDEPENDENT_AMBULATORY_CARE_PROVIDER_SITE_OTHER): Payer: 59 | Admitting: Family Medicine

## 2017-03-06 VITALS — BP 124/80 | HR 80 | Temp 98.3°F | Resp 16 | Ht 72.0 in | Wt 214.0 lb

## 2017-03-06 DIAGNOSIS — Z0000 Encounter for general adult medical examination without abnormal findings: Secondary | ICD-10-CM

## 2017-03-06 DIAGNOSIS — I10 Essential (primary) hypertension: Secondary | ICD-10-CM

## 2017-03-06 DIAGNOSIS — R7303 Prediabetes: Secondary | ICD-10-CM

## 2017-03-06 DIAGNOSIS — E782 Mixed hyperlipidemia: Secondary | ICD-10-CM

## 2017-03-06 DIAGNOSIS — E663 Overweight: Secondary | ICD-10-CM

## 2017-03-06 DIAGNOSIS — Z125 Encounter for screening for malignant neoplasm of prostate: Secondary | ICD-10-CM

## 2017-03-06 LAB — POCT GLYCOSYLATED HEMOGLOBIN (HGB A1C): Hemoglobin A1C: 5.8 — AB (ref ?–5.7)

## 2017-03-06 MED ORDER — METFORMIN HCL ER 750 MG PO TB24: 750.0000 mg | ORAL_TABLET | Freq: Every day | ORAL | 3 refills | Status: DC

## 2017-03-06 NOTE — Assessment & Plan Note (Signed)
Well-controlled Pre-DM with A1c 5.8 from past 5.7 to 5.9 Concern with HTN, HLD/TG  Plan:  1. INCREASE Metformin XR 500 to 750mg  daily - new rx sent to mail order - discussion on options, advised we could hold at 500mg  daily, otherwise we agreed to be slightly more aggressive with his med and lifestyle, and inc Metformin today instead 2. Encourage improved lifestyle - low carb, low sugar diet, reduce portion size, continue improving regular exercise - has shown significant improvement in lifestyle, expect A1c to improve more 3. Follow-up 6 months Annual Physical + labs

## 2017-03-06 NOTE — Assessment & Plan Note (Addendum)
Well-controlled HTN - Home BP readings normal  No known complications     Plan:  1. Continue current BP regimen Amlodipine-Benazepril 5-10mg  daily  2. Encourage improved lifestyle - low sodium diet, regular exercise 3. Continue monitor BP outside office, bring readings to next visit, if persistently >140/90 or new symptoms notify office sooner 4. Follow-up q 6 mo annual + labs

## 2017-03-06 NOTE — Patient Instructions (Addendum)
Thank you for coming to the office today.  1.  A1c 5.8  Increase Metformin XR from 500 to 750mg  daily - new rx sent to [facility]  Continue improving lifestyle as discussed   DUE for FASTING BLOOD WORK (no food or drink after midnight before the lab appointment, only water or coffee without cream/sugar on the morning of)  SCHEDULE "Lab Only" visit in the morning at the clinic for lab draw in 6 MONTHS   - Make sure Lab Only appointment is at about 1 week before your next appointment, so that results will be available  For Lab Results, once available within 2-3 days of blood draw, you can can log in to MyChart online to view your results and a brief explanation. Also, we can discuss results at next follow-up visit.   Please schedule a Follow-up Appointment to: Return in about 6 months (around 09/03/2017) for Annual Physical.    If you have any other questions or concerns, please feel free to call the office or send a message through MyChart. You may also schedule an earlier appointment if necessary.  Additionally, you may be receiving a survey about your experience at our office within a few days to 1 week by e-mail or mail. We value your feedback.  Saralyn PilarAlexander Karamalegos, DO [facility]

## 2017-03-06 NOTE — Progress Notes (Signed)
Subjective:    Patient ID: Jeremy [name], male    DOB: [birth year], 57 y.o.   MRN: [id number]  Jeremy [name] is a 57 y.o. male presenting on 03/06/2017 for prediabetes   HPI   Pre-Diabetes: Recent course he was started on Metformin in 08/2016 500mg  daily and eventually transitioned to XR 500mg  daily for better 24 hour coverage and results. He has tried to improve lifestyle and has no new concerns. Eager to learn A1c result. - A1c trend is stable 5.8, previously 5.7 and 5.9 CBGs: Randomly checks CBG not aware to check >2 hr post prandial has variable readings. Meds: Metformin XR 500mg  daily - tolerating well Currently on ACEi Lifestyle: - Diet: Adherent to DM diet, low carb, over holidays he had to work on improved portion control overall - Exercise: Still improved regular exercise at gym - Weight gain +2 lb in 2-3 months, but overall down 3 lbs in >5 months Denies hypoglycemia, polyuria, visual changes, numbness or tingling.  CHRONIC HTN: Reports no new concerns. Checking BP outside office, normal. Current Meds - Amlodipine-Benazepril 5-10mg  daily   Reports good compliance, took meds today. Tolerating well, w/o complaints.  Allergic Rhinitis Reports recent sick contacts with wife and step-daughter at home with similar URI sinusitis symptoms concern for sinusitis. He states he has not had any symptoms of this, he has used Flonase regularly and some nasal saline spray, he denies any symptoms just wanted to mention. - Also he used galbreath technique as demonstrated and showed his step daughter and this has helped her ear pressure  Health Maintenance: UTD Flu Vaccine 11/2016  Depression screen [facility] 2/9 03/06/2017 04/14/2016 07/21/2014  Decreased Interest 0 0 0  Down, Depressed, Hopeless 0 0 0  PHQ - 2 Score 0 0 0    Social History   Tobacco Use  . Smoking status: Former Smoker    Packs/day: 1.00    Years: 35.00    Pack years: 35.00    Types: Cigarettes    Last attempt to  quit: 01/06/2014    Years since quitting: 3.1  . Smokeless tobacco: Former Engineer, water Use Topics  . Alcohol use: Yes    Alcohol/week: 7.2 oz    Types: 12 Cans of beer per week  . Drug use: No    Review of Systems Per HPI unless specifically indicated above     Objective:    BP 124/80   Pulse 80   Temp 98.3 F (36.8 C) (Oral)   Resp 16   Ht 6' (1.829 m)   Wt 214 lb (97.1 kg)   BMI 29.02 kg/m   Wt Readings from Last 3 Encounters:  03/06/17 214 lb (97.1 kg)  01/17/17 212 lb (96.2 kg)  08/31/16 217 lb 9.6 oz (98.7 kg)    Physical Exam  Constitutional: He is oriented to person, place, and time. He appears well-developed and well-nourished. No distress.  Well-appearing, comfortable, cooperative  HENT:  Head: Normocephalic and atraumatic.  Mouth/Throat: Oropharynx is clear and moist.  Frontal / maxillary sinuses non-tender. Nares patent without purulence or edema. Bilateral TMs clear without erythema, effusion or bulging. Oropharynx clear without erythema, exudates, edema or asymmetry.  Eyes: Conjunctivae are normal. Right eye exhibits no discharge. Left eye exhibits no discharge.  Neck: Normal range of motion. Neck supple.  Cardiovascular: Normal rate, regular rhythm, normal heart sounds and intact distal pulses.  No murmur heard. Pulmonary/Chest: Effort normal and breath sounds normal. No respiratory distress. He has no wheezes.  He has no rales.  Musculoskeletal: Normal range of motion. He exhibits no edema.  Neurological: He is alert and oriented to person, place, and time.  Skin: Skin is warm and dry. No rash noted. He is not diaphoretic. No erythema.  Psychiatric: He has a normal mood and affect. His behavior is normal.  Well groomed, good eye contact, normal speech and thoughts  Nursing note and vitals reviewed.  Results for orders placed or performed in visit on 03/06/17  POCT HgB A1C  Result Value Ref Range   Hemoglobin A1C 5.8 (A) 5.7      Assessment &  Plan:   Problem List Items Addressed This Visit    Essential (primary) hypertension    Well-controlled HTN - Home BP readings normal  No known complications     Plan:  1. Continue current BP regimen Amlodipine-Benazepril 5-10mg  daily  2. Encourage improved lifestyle - low sodium diet, regular exercise 3. Continue monitor BP outside office, bring readings to next visit, if persistently >140/90 or new symptoms notify office sooner 4. Follow-up q 6 mo annual + labs      Prediabetes - Primary    Well-controlled Pre-DM with A1c 5.8 from past 5.7 to 5.9 Concern with HTN, HLD/TG  Plan:  1. INCREASE Metformin XR 500 to 750mg  daily - new rx sent to mail order - discussion on options, advised we could hold at 500mg  daily, otherwise we agreed to be slightly more aggressive with his med and lifestyle, and inc Metformin today instead 2. Encourage improved lifestyle - low carb, low sugar diet, reduce portion size, continue improving regular exercise - has shown significant improvement in lifestyle, expect A1c to improve more 3. Follow-up 6 months Annual Physical + labs      Relevant Medications   metFORMIN (GLUCOPHAGE-XR) 750 MG 24 hr tablet   Other Relevant Orders   POCT HgB A1C (Completed)      Allergic Rhinitis No sign of infection today Continue Flonase, advised use nasal saline and avoid infection improve hand hygiene Follow-up if develop sinusitis  Meds ordered this encounter  Medications  . metFORMIN (GLUCOPHAGE-XR) 750 MG 24 hr tablet    Sig: Take 1 tablet (750 mg total) by mouth daily with breakfast.    Dispense:  90 tablet    Refill:  3    Dose increase from 500 to 750mg  XR    Follow up plan: Return in about 6 months (around 09/03/2017) for Annual Physical.  Future labs ordered for 08/2017  Saralyn PilarAlexander Ayven Pheasant, DO [facility] 03/06/2017, 9:59 AM

## 2017-03-18 ENCOUNTER — Other Ambulatory Visit: Payer: Self-pay | Admitting: Family Medicine

## 2017-03-18 DIAGNOSIS — E785 Hyperlipidemia, unspecified: Secondary | ICD-10-CM

## 2017-04-01 ENCOUNTER — Other Ambulatory Visit: Payer: Self-pay | Admitting: Family Medicine

## 2017-04-01 DIAGNOSIS — I10 Essential (primary) hypertension: Secondary | ICD-10-CM

## 2017-07-25 ENCOUNTER — Encounter: Payer: Self-pay | Admitting: Family Medicine

## 2017-07-26 ENCOUNTER — Encounter: Payer: Self-pay | Admitting: Family Medicine

## 2017-07-26 DIAGNOSIS — K649 Unspecified hemorrhoids: Secondary | ICD-10-CM

## 2017-07-26 MED ORDER — HYDROCORTISONE ACETATE 25 MG RE SUPP: 25.0000 mg | Freq: Two times a day (BID) | RECTAL | 1 refills | Status: DC

## 2017-09-10 ENCOUNTER — Telehealth: Payer: Self-pay | Admitting: *Deleted

## 2017-09-10 NOTE — Telephone Encounter (Signed)
Attempted to contact patient r/t CT Screening due at this time.  No answer received, message left for patient to call [phone] to schedule appointment.    

## 2017-09-11 ENCOUNTER — Telehealth: Payer: Self-pay | Admitting: *Deleted

## 2017-09-11 NOTE — Telephone Encounter (Signed)
Attempted to contact patient r/t CT Screening due at this time.  No answer received, message left for patient to call [phone] to schedule appointment.    

## 2017-09-12 ENCOUNTER — Telehealth: Payer: Self-pay | Admitting: *Deleted

## 2017-09-12 DIAGNOSIS — Z87891 Personal history of nicotine dependence: Secondary | ICD-10-CM

## 2017-09-12 DIAGNOSIS — Z122 Encounter for screening for malignant neoplasm of respiratory organs: Secondary | ICD-10-CM

## 2017-09-12 NOTE — Telephone Encounter (Signed)
Patient has been notified that annual lung cancer screening low dose CT scan is due currently or will be in near future. Confirmed that patient is within the age range of 55-77, and asymptomatic, (no signs or symptoms of lung cancer). Patient denies illness that would prevent curative treatment for lung cancer if found. Verified smoking history, (former, quit 01/06/14, 35 pack year). The shared decision making visit was done 03/15/16. Patient is agreeable for CT scan being scheduled.  

## 2017-09-12 NOTE — Telephone Encounter (Signed)
Patient called r/t CT screening of lung due at this time.  Patient voiced understanding and agreement with CT screening.  Appointment sent to scheduling for future appointment.    

## 2017-09-20 ENCOUNTER — Ambulatory Visit
Admission: RE | Admit: 2017-09-20 | Discharge: 2017-09-20 | Disposition: A | Discharge status: Home / Self Care | Payer: 59 | Source: Ambulatory Visit | Attending: Oncology | Admitting: Oncology

## 2017-09-20 DIAGNOSIS — J439 Emphysema, unspecified: Secondary | ICD-10-CM | POA: Diagnosis not present

## 2017-09-20 DIAGNOSIS — I2510 Atherosclerotic heart disease of native coronary artery without angina pectoris: Secondary | ICD-10-CM | POA: Insufficient documentation

## 2017-09-20 DIAGNOSIS — Z87891 Personal history of nicotine dependence: Secondary | ICD-10-CM

## 2017-09-20 DIAGNOSIS — Z122 Encounter for screening for malignant neoplasm of respiratory organs: Secondary | ICD-10-CM | POA: Diagnosis not present

## 2017-09-20 DIAGNOSIS — I700 Atherosclerosis of aorta: Secondary | ICD-10-CM | POA: Diagnosis not present

## 2017-09-21 ENCOUNTER — Encounter: Payer: Self-pay | Admitting: *Deleted

## 2017-09-21 ENCOUNTER — Telehealth: Payer: Self-pay | Admitting: *Deleted

## 2017-09-21 NOTE — Telephone Encounter (Signed)
Notified patient of LDCT lung cancer screening program results with recommendation for  12 month follow up imaging.  Also notified of incidental findings noted below and is encouraged to discuss further questions with PCP who will receive a copy of this not and/or the CT reports.  Patient verbalized understanding.    IMPRESSION: 1. Lung-RADS 2, benign appearance or behavior. Continue annual screening with low-dose chest CT without contrast in 12 months. 2. Aortic Atherosclerosis (ICD10-I70.0) and Emphysema (ICD10-J43.9). 3. RCA coronary artery atherosclerotic calcifications.

## 2017-09-25 ENCOUNTER — Encounter: Payer: Self-pay | Admitting: *Deleted

## 2017-09-27 ENCOUNTER — Other Ambulatory Visit: Payer: 59

## 2017-09-27 DIAGNOSIS — Z125 Encounter for screening for malignant neoplasm of prostate: Secondary | ICD-10-CM

## 2017-09-27 DIAGNOSIS — R7303 Prediabetes: Secondary | ICD-10-CM

## 2017-09-27 DIAGNOSIS — E782 Mixed hyperlipidemia: Secondary | ICD-10-CM | POA: Diagnosis not present

## 2017-09-27 DIAGNOSIS — I10 Essential (primary) hypertension: Secondary | ICD-10-CM

## 2017-09-27 DIAGNOSIS — Z0000 Encounter for general adult medical examination without abnormal findings: Secondary | ICD-10-CM

## 2017-09-28 LAB — CBC WITH DIFFERENTIAL/PLATELET
Basophils Absolute: 73 cells/uL (ref 0–200)
Basophils Relative: 1 %
EOS ABS: 299 {cells}/uL (ref 15–500)
Eosinophils Relative: 4.1 %
HCT: 46.7 % (ref 38.5–50.0)
Hemoglobin: 16.1 g/dL (ref 13.2–17.1)
Lymphs Abs: 2942 cells/uL (ref 850–3900)
MCH: 31.5 pg (ref 27.0–33.0)
MCHC: 34.5 g/dL (ref 32.0–36.0)
MCV: 91.4 fL (ref 80.0–100.0)
MPV: 11.8 fL (ref 7.5–12.5)
Monocytes Relative: 7.4 %
NEUTROS PCT: 47.2 %
Neutro Abs: 3446 cells/uL (ref 1500–7800)
PLATELETS: 180 10*3/uL (ref 140–400)
RBC: 5.11 10*6/uL (ref 4.20–5.80)
RDW: 12.2 % (ref 11.0–15.0)
TOTAL LYMPHOCYTE: 40.3 %
WBC: 7.3 10*3/uL (ref 3.8–10.8)
WBCMIX: 540 {cells}/uL (ref 200–950)

## 2017-09-28 LAB — COMPLETE METABOLIC PANEL WITH GFR
AG RATIO: 1.7 (calc) (ref 1.0–2.5)
ALKALINE PHOSPHATASE (APISO): 73 U/L (ref 40–115)
ALT: 27 U/L (ref 9–46)
AST: 20 U/L (ref 10–35)
Albumin: 4.2 g/dL (ref 3.6–5.1)
BUN: 13 mg/dL (ref 7–25)
CO2: 25 mmol/L (ref 20–32)
CREATININE: 0.91 mg/dL (ref 0.70–1.33)
Calcium: 9.6 mg/dL (ref 8.6–10.3)
Chloride: 106 mmol/L (ref 98–110)
GFR, Est African American: 108 mL/min/{1.73_m2} (ref >=60)
GFR, Est Non African American: 93 mL/min/{1.73_m2} (ref >=60)
GLOBULIN: 2.5 g/dL (ref 1.9–3.7)
Glucose, Bld: 112 mg/dL — ABNORMAL HIGH (ref 65–99)
POTASSIUM: 4.5 mmol/L (ref 3.5–5.3)
SODIUM: 140 mmol/L (ref 135–146)
Total Bilirubin: 0.6 mg/dL (ref 0.2–1.2)
Total Protein: 6.7 g/dL (ref 6.1–8.1)

## 2017-09-28 LAB — LIPID PANEL
CHOL/HDL RATIO: 3.6 (calc) (ref <5.0)
Cholesterol: 163 mg/dL (ref <200)
HDL: 45 mg/dL (ref >40)
LDL CHOLESTEROL (CALC): 93 mg/dL
Non-HDL Cholesterol (Calc): 118 mg/dL (calc) (ref <130)
TRIGLYCERIDES: 152 mg/dL — AB (ref <150)

## 2017-09-28 LAB — HEMOGLOBIN A1C
HEMOGLOBIN A1C: 5.7 %{Hb} — AB (ref <5.7)
Mean Plasma Glucose: 117 (calc)
eAG (mmol/L): 6.5 (calc)

## 2017-09-28 LAB — PSA, TOTAL WITH REFLEX TO PSA, FREE: PSA, TOTAL: 0.4 ng/mL (ref <=4.0)

## 2017-10-03 ENCOUNTER — Encounter: Payer: Self-pay | Admitting: Family Medicine

## 2017-10-03 ENCOUNTER — Ambulatory Visit (INDEPENDENT_AMBULATORY_CARE_PROVIDER_SITE_OTHER): Payer: 59 | Admitting: Family Medicine

## 2017-10-03 ENCOUNTER — Other Ambulatory Visit: Payer: Self-pay | Admitting: Family Medicine

## 2017-10-03 VITALS — BP 126/70 | HR 77 | Temp 98.6°F | Resp 16 | Ht 72.0 in | Wt 208.0 lb

## 2017-10-03 DIAGNOSIS — I10 Essential (primary) hypertension: Secondary | ICD-10-CM

## 2017-10-03 DIAGNOSIS — Z0000 Encounter for general adult medical examination without abnormal findings: Secondary | ICD-10-CM | POA: Diagnosis not present

## 2017-10-03 DIAGNOSIS — E663 Overweight: Secondary | ICD-10-CM

## 2017-10-03 DIAGNOSIS — K219 Gastro-esophageal reflux disease without esophagitis: Secondary | ICD-10-CM

## 2017-10-03 DIAGNOSIS — E782 Mixed hyperlipidemia: Secondary | ICD-10-CM

## 2017-10-03 DIAGNOSIS — K635 Polyp of colon: Secondary | ICD-10-CM

## 2017-10-03 DIAGNOSIS — R7303 Prediabetes: Secondary | ICD-10-CM | POA: Diagnosis not present

## 2017-10-03 DIAGNOSIS — Z125 Encounter for screening for malignant neoplasm of prostate: Secondary | ICD-10-CM

## 2017-10-03 DIAGNOSIS — Z1211 Encounter for screening for malignant neoplasm of colon: Secondary | ICD-10-CM

## 2017-10-03 MED ORDER — PANTOPRAZOLE SODIUM 40 MG PO TBEC: 40.0000 mg | DELAYED_RELEASE_TABLET | Freq: Every day | ORAL | 1 refills | Status: DC

## 2017-10-03 NOTE — Progress Notes (Signed)
Subjective:    Patient ID: Jeremy [name], male    DOB: 09/14/1960, 57 y.o.   MRN: [id number]  Jeremy [name] is a 57 y.o. male presenting on 10/03/2017 for Annual Exam   HPI   Here for Annual Physical and Lab Review.  Pre-Diabetes / Overweight BMI >28 Last visit 02/2017, he was increased on Metformin from XR 500mg  daily up to XR 750mg  daily with A1c up to 5.8 Labs now show A1c 5.7 He is doing well overall with lifestyle. CBGs:no detailed readings available today, he is monitoring it. Meds:Metformin XR 750mg  daily - tolerating well Currently on ACEi Lifestyle: - weight down 6 lbs in 6 months Diet - Stopped eating red meat, more chicken and fish, salads, light vinegar. Exercise - increasing walking and up to 10k steps Denies hypoglycemia  CHRONIC HTN: Reportsno new concerns. Checking BP outside office, normal. Current Meds -Amlodipine-Benazepril 5-10mg  daily Reports good compliance, took meds today. Tolerating well, w/o complaints.  GERD Chronic history of acid reflux. Previously on PPI with Pantoprazole 40mg  in past, last in 2017, did well took this intermittently for few years, has no concerns otherwise, tried Zantac without as effective result. Has some symptoms in evening when laying down. Request refill today. No prior GI EGD. Denies GI red flag symptoms  HYPERLIPIDEMIA: - Reports no concerns. Last lipid panel 09/2017, controlled  - Currently taking Atorvastatin 20mg , tolerating well without side effects or myalgias   Health Maintenance:  Due for Flu vaccine - will return when in stock.  Prostate CA Screening: Prior PSA / DRE reported normal. Last PSA 0.4 (09/2017). Currently asymptomatic. No known family history of prostate CA.   Colon CA Screening: Last Colonoscopy approximately 5-7 years ago ([facility] Dr Marva PandaSkulskie), results with 2-3 polyps benign, good for 5-7 years years. Currently asymptomatic, has occasional hemorrhoid. No known family history of  colon CA. Due for screening test referral to new location [facility] to repeat colonoscopy.  [facility] 10/03/2017 03/06/2017 04/14/2016  Decreased Interest 0 0 0  Down, Depressed, Hopeless 0 0 0  PHQ - 2 Score 0 0 0    History reviewed. No pertinent past medical history. Past Surgical History:  Procedure Laterality Date  . NASAL SEPTOPLASTY W/ TURBINOPLASTY  2009   Dr Jenne [facility]   Social History   Socioeconomic History  . Marital status: Married    Spouse name: Not on file  . Number of children: Not on file  . Years of education: Not on file  . Highest education level: Not on file  Occupational History  . Not on file  Social Needs  . Financial resource strain: Not on file  . Food insecurity:    Worry: Not on file    Inability: Not on file  . Transportation needs:    Medical: Not on file    Non-medical: Not on file  Tobacco Use  . Smoking status: Former Smoker    Packs/day: 1.00    Years: 35.00    Pack years: 35.00    Types: Cigarettes    Last attempt to quit: 01/06/2014    Years since quitting: 3.7  . Smokeless tobacco: Former Engineer, waterUser  Substance and Sexual Activity  . Alcohol use: Yes    Alcohol/week: 12.0 standard drinks    Types: 12 Cans of beer per week  . Drug use: No  . Sexual activity: Not on file  Lifestyle  . Physical activity:    Days per week: Not on  file    Minutes per session: Not on file  . Stress: Not on file  Relationships  . Social connections:    Talks on phone: Not on file    Gets together: Not on file    Attends religious service: Not on file    Active member of club or organization: Not on file    Attends meetings of clubs or organizations: Not on file    Relationship status: Not on file  . Intimate partner violence:    Fear of current or ex partner: Not on file    Emotionally abused: Not on file    Physically abused: Not on file    Forced sexual activity: Not on file  Other Topics Concern  . Not on file  Social History  Narrative  . Not on file   Family History  Problem Relation Age of Onset  . Cancer Mother        breast  . Clotting disorder Father   . Heart attack Father   . Cancer Brother        pancreatic  . Heart attack Brother    Current Outpatient Medications on File Prior to Visit  Medication Sig  . amLODipine-benazepril (LOTREL) 5-10 MG capsule TAKE 1 CAPSULE BY MOUTH  DAILY  . atorvastatin (LIPITOR) 20 MG tablet TAKE 1 TABLET BY MOUTH  DAILY  . cetirizine (ZYRTEC) 10 MG tablet Take 10 mg by mouth daily.  . fluticasone (FLONASE) 50 MCG/ACT nasal spray Place 2 sprays into both nostrils daily. Use for 4-6 weeks then stop and use seasonally or as needed.  . hydrocortisone (ANUSOL-HC) 25 MG suppository Place 1 suppository (25 mg total) rectally 2 (two) times daily. For 7 days  . metFORMIN (GLUCOPHAGE-XR) 750 MG 24 hr tablet Take 1 tablet (750 mg total) by mouth daily with breakfast.  . Multiple Vitamin (MULTIVITAMIN) tablet Take 1 tablet by mouth daily. Men's a day   No current facility-administered medications on file prior to visit.     Review of Systems  Constitutional: Negative for activity change, appetite change, chills, diaphoresis, fatigue and fever.  HENT: Negative for congestion and hearing loss.   Eyes: Negative for visual disturbance.  Respiratory: Negative for apnea, cough, choking, chest tightness, shortness of breath and wheezing.   Cardiovascular: Negative for chest pain, palpitations and leg swelling.  Gastrointestinal: Negative for abdominal pain, anal bleeding, blood in stool, constipation, diarrhea, nausea and vomiting.  Endocrine: Negative for cold intolerance.  Genitourinary: Negative for decreased urine volume, difficulty urinating, dysuria, frequency, hematuria, testicular pain and urgency.  Musculoskeletal: Negative for arthralgias, back pain and neck pain.  Skin: Negative for rash.  Allergic/Immunologic: Negative for environmental allergies.  Neurological:  Negative for dizziness, weakness, light-headedness, numbness and headaches.  Hematological: Negative for adenopathy.  Psychiatric/Behavioral: Negative for behavioral problems, dysphoric mood and sleep disturbance. The patient is not nervous/anxious.    Per HPI unless specifically indicated above     Objective:    BP 126/70   Pulse 77   Temp 98.6 F (37 C) (Oral)   Resp 16   Ht 6' (1.829 m)   Wt 208 lb (94.3 kg)   SpO2 97%   BMI 28.21 kg/m   Wt Readings from Last 3 Encounters:  10/03/17 208 lb (94.3 kg)  09/20/17 205 lb (93 kg)  03/06/17 214 lb (97.1 kg)    Physical Exam  Constitutional: He is oriented to person, place, and time. He appears well-developed and well-nourished. No distress.  Well-appearing, comfortable,  cooperative  HENT:  Head: Normocephalic and atraumatic.  Mouth/Throat: Oropharynx is clear and moist.  Frontal / maxillary sinuses non-tender. Nares patent without purulence or edema. Bilateral TMs clear without erythema, effusion or bulging. Oropharynx clear without erythema, exudates, edema or asymmetry.  Eyes: Pupils are equal, round, and reactive to light. Conjunctivae and EOM are normal. Right eye exhibits no discharge. Left eye exhibits no discharge.  Neck: Normal range of motion. Neck supple. No thyromegaly present.  Cardiovascular: Normal rate, regular rhythm, normal heart sounds and intact distal pulses.  No murmur heard. Pulmonary/Chest: Effort normal and breath sounds normal. No respiratory distress. He has no wheezes. He has no rales.  Abdominal: Soft. Bowel sounds are normal. He exhibits no distension and no mass. There is no tenderness.  Genitourinary:  Genitourinary Comments: Deferred DRE  Musculoskeletal: Normal range of motion. He exhibits no edema or tenderness.  Upper / Lower Extremities: - Normal muscle tone, strength bilateral upper extremities 5/5, lower extremities 5/5  Lymphadenopathy:    He has no cervical adenopathy.  Neurological: He  is alert and oriented to person, place, and time.  Distal sensation intact to light touch all extremities  Skin: Skin is warm and dry. No rash noted. He is not diaphoretic. No erythema.  Psychiatric: He has a normal mood and affect. His behavior is normal.  Well groomed, good eye contact, normal speech and thoughts  Nursing note and vitals reviewed.  Results for orders placed or performed in visit on 09/27/17  PSA, Total with Reflex to PSA, Free  Result Value Ref Range   PSA, Total 0.4 < OR = 4.0 ng/mL  CBC with Differential/Platelet  Result Value Ref Range   WBC 7.3 3.8 - 10.8 Thousand/uL   RBC 5.11 4.20 - 5.80 Million/uL   Hemoglobin 16.1 13.2 - 17.1 g/dL   HCT 40.9 81.1 - 91.4 %   MCV 91.4 80.0 - 100.0 fL   MCH 31.5 27.0 - 33.0 pg   MCHC 34.5 32.0 - 36.0 g/dL   RDW 78.2 95.6 - 21.3 %   Platelets 180 140 - 400 Thousand/uL   MPV 11.8 7.5 - 12.5 fL   Neutro Abs 3,446 1,500 - 7,800 cells/uL   Lymphs Abs 2,942 850 - 3,900 cells/uL   WBC mixed population 540 200 - 950 cells/uL   Eosinophils Absolute 299 15 - 500 cells/uL   Basophils Absolute 73 0 - 200 cells/uL   Neutrophils Relative % 47.2 %   Total Lymphocyte 40.3 %   Monocytes Relative 7.4 %   Eosinophils Relative 4.1 %   Basophils Relative 1.0 %  Lipid panel  Result Value Ref Range   Cholesterol 163 <200 mg/dL   HDL 45 >08 mg/dL   Triglycerides 657 (H) <150 mg/dL   LDL Cholesterol (Calc) 93 mg/dL (calc)   Total CHOL/HDL Ratio 3.6 <5.0 (calc)   Non-HDL Cholesterol (Calc) 118 <130 mg/dL (calc)  Hemoglobin Q4O  Result Value Ref Range   Hgb A1c MFr Bld 5.7 (H) <5.7 % of total Hgb   Mean Plasma Glucose 117 (calc)   eAG (mmol/L) 6.5 (calc)  COMPLETE METABOLIC PANEL WITH GFR  Result Value Ref Range   Glucose, Bld 112 (H) 65 - 99 mg/dL   BUN 13 7 - 25 mg/dL   Creat 9.62 9.52 - 8.41 mg/dL   GFR, Est Non African American 93 > OR = 60 mL/min/1.68m2   GFR, Est African American 108 > OR = 60 mL/min/1.43m2   BUN/Creatinine  Ratio NOT APPLICABLE  6 - 22 (calc)   Sodium 140 135 - 146 mmol/L   Potassium 4.5 3.5 - 5.3 mmol/L   Chloride 106 98 - 110 mmol/L   CO2 25 20 - 32 mmol/L   Calcium 9.6 8.6 - 10.3 mg/dL   Total Protein 6.7 6.1 - 8.1 g/dL   Albumin 4.2 3.6 - 5.1 g/dL   Globulin 2.5 1.9 - 3.7 g/dL (calc)   AG Ratio 1.7 1.0 - 2.5 (calc)   Total Bilirubin 0.6 0.2 - 1.2 mg/dL   Alkaline phosphatase (APISO) 73 40 - 115 U/L   AST 20 10 - 35 U/L   ALT 27 9 - 46 U/L      Assessment & Plan:   Problem List Items Addressed This Visit    Essential (primary) hypertension    Well-controlled HTN - Home BP readings normal  No known complications     Plan:  1. Continue current BP regimen Amlodipine-Benazepril 5-10mg  daily  2. Encourage improved lifestyle - low sodium diet, regular exercise 3. Continue monitor BP outside office, bring readings to next visit, if persistently >140/90 or new symptoms notify office sooner 4. Follow-up yearly - labs next year      GERD (gastroesophageal reflux disease)    Stable with occasional flare ups, some dietary triggers Previously improved with Pantoprazole, has tried with good results Limited benefit and non adherence on H2 blocker No prior GI or EGD No GI red flag symptoms  Plan Agree to refill Pantoprazole 40mg  daily before breakfast, #90 only likely will not need more, taking very infrequently PRN with good results Limit GERD risk factors, as he has already reduced alcohol and other triggers Follow-up      Relevant Medications   pantoprazole (PROTONIX) 40 MG tablet   Hyperlipidemia    Controlled cholesterol statin and improving lifestyle Last lipid panel 09/2017 Calculated ASCVD 10 yr risk score < 7.5%  Plan: 1. Continue current meds - Atorvastatin 20mg  daily 2. Encourage improved lifestyle - low carb/cholesterol, reduce portion size, continue improving regular exercise 3. Follow-up yearly now lipids      Overweight with body mass index (BMI) 25.0-29.9     Overweight BMI >28, with some weight loss Continue improving lifestyle diet and exercise Risk factor HTN HLD PreDM      Prediabetes    Well-controlled Pre-DM with A1c down to 5.7, improved on lifestyle and med Concern with HTN, HLD/TG  Plan:  1. Continue current Metformin XR 750mg  daily - future may consider dose reduction again 2. Encourage improved lifestyle - low carb, low sugar diet, reduce portion size, continue improving regular exercise - has shown significant improvement in lifestyle 3. Follow-up yearly now for A1c with labs - defer 6 month check since improvements       Other Visit Diagnoses    Annual physical exam    -  Primary Updated Health Maintenance information - Return for Flu vaccine - Refer GI for colonoscopy Reviewed recent lab results with patient Encouraged improvement to lifestyle with diet and exercise - Goal of weight loss lifestyle     Polyp of colon, unspecified part of colon, unspecified type       Relevant Orders   Ambulatory referral to Gastroenterology   Screen for colon cancer       Relevant Orders   Ambulatory referral to Gastroenterology  Switch from [facility] to [facility] by patient preference Referral sent - routine screen, asymptomatic      Meds ordered this encounter  Medications  .  pantoprazole (PROTONIX) 40 MG tablet    Sig: Take 1 tablet (40 mg total) by mouth daily before breakfast.    Dispense:  90 tablet    Refill:  1   Orders Placed This Encounter  Procedures  . Ambulatory referral to Gastroenterology    Referral Priority:   Routine    Referral Type:   Consultation    Referral Reason:   Specialty Services Required    Number of Visits Requested:   1    Follow up plan: Return in about 1 year (around 10/04/2018) for Annual Physical.  Future labs ordered approx 8-10/2018  Saralyn Pilar, DO [facility] 10/03/2017, 1:52 PM

## 2017-10-03 NOTE — Assessment & Plan Note (Signed)
Overweight BMI >28, with some weight loss Continue improving lifestyle diet and exercise Risk factor HTN HLD PreDM

## 2017-10-03 NOTE — Assessment & Plan Note (Signed)
Well-controlled HTN - Home BP readings normal  No known complications     Plan:  1. Continue current BP regimen Amlodipine-Benazepril 5-10mg  daily  2. Encourage improved lifestyle - low sodium diet, regular exercise 3. Continue monitor BP outside office, bring readings to next visit, if persistently >140/90 or new symptoms notify office sooner 4. Follow-up yearly - labs next year

## 2017-10-03 NOTE — Assessment & Plan Note (Addendum)
Controlled cholesterol statin and improving lifestyle Last lipid panel 09/2017 Calculated ASCVD 10 yr risk score < 7.5%  Plan: 1. Continue current meds - Atorvastatin 20mg  daily 2. Encourage improved lifestyle - low carb/cholesterol, reduce portion size, continue improving regular exercise 3. Follow-up yearly now lipids

## 2017-10-03 NOTE — Assessment & Plan Note (Signed)
Well-controlled Pre-DM with A1c down to 5.7, improved on lifestyle and med Concern with HTN, HLD/TG  Plan:  1. Continue current Metformin XR 750mg  daily - future may consider dose reduction again 2. Encourage improved lifestyle - low carb, low sugar diet, reduce portion size, continue improving regular exercise - has shown significant improvement in lifestyle 3. Follow-up yearly now for A1c with labs - defer 6 month check since improvements

## 2017-10-03 NOTE — Assessment & Plan Note (Signed)
Stable with occasional flare ups, some dietary triggers Previously improved with Pantoprazole, has tried with good results Limited benefit and non adherence on H2 blocker No prior GI or EGD No GI red flag symptoms  Plan Agree to refill Pantoprazole 40mg  daily before breakfast, #90 only likely will not need more, taking very infrequently PRN with good results Limit GERD risk factors, as he has already reduced alcohol and other triggers Follow-up

## 2017-10-03 NOTE — Patient Instructions (Addendum)
Thank you for coming to the office today.  Keep up the great work!  Referral to GI specialist for colonoscopy - call them if not heard back with apt in 2 weeks  [facility] [address] [place], [place] [address] Phone: [phone]  Please schedule and return for a NURSE ONLY VISIT for VACCINE - Approximately around October November 2019 - Need Flu Vaccine  DUE for FASTING BLOOD WORK (no food or drink after midnight before the lab appointment, only water or coffee without cream/sugar on the morning of)  SCHEDULE "Lab Only" visit in the morning at the clinic for lab draw in 1 YEAR  - Make sure Lab Only appointment is at about 1 week before your next appointment, so that results will be available  For Lab Results, once available within 2-3 days of blood draw, you can can log in to MyChart online to view your results and a brief explanation. Also, we can discuss results at next follow-up visit.   Please schedule a Follow-up Appointment to: Return in about 1 year (around 10/04/2018) for Annual Physical.  If you have any other questions or concerns, please feel free to call the office or send a message through MyChart. You may also schedule an earlier appointment if necessary.  Additionally, you may be receiving a survey about your experience at our office within a few days to 1 week by e-mail or mail. We value your feedback.  [name], DO [facility]

## 2017-10-22 ENCOUNTER — Encounter: Payer: Self-pay | Admitting: *Deleted

## 2017-10-24 ENCOUNTER — Other Ambulatory Visit: Payer: Self-pay

## 2017-10-24 DIAGNOSIS — Z1211 Encounter for screening for malignant neoplasm of colon: Secondary | ICD-10-CM

## 2017-10-31 ENCOUNTER — Encounter: Payer: Self-pay | Admitting: *Deleted

## 2017-10-31 ENCOUNTER — Other Ambulatory Visit: Payer: Self-pay

## 2017-11-06 NOTE — Anesthesia Preprocedure Evaluation (Addendum)
Anesthesia Evaluation  Patient identified by MRN, date of birth, ID band Patient awake    Reviewed: Allergy & Precautions, NPO status , Patient's Chart, lab work & pertinent test results  History of Anesthesia Complications Negative for: history of anesthetic complications  Airway Mallampati: I  TM Distance: >3 FB Neck ROM: Full    Dental no notable dental hx.    Pulmonary former smoker (quit 2015),  Snoring    Pulmonary exam normal breath sounds clear to auscultation       Cardiovascular hypertension, Normal cardiovascular exam Rhythm:Regular Rate:Normal     Neuro/Psych negative neurological ROS     GI/Hepatic GERD  ,  Endo/Other  diabetes, Type 2  Renal/GU negative Renal ROS     Musculoskeletal   Abdominal   Peds  Hematology negative hematology ROS (+)   Anesthesia Other Findings   Reproductive/Obstetrics                            Anesthesia Physical Anesthesia Plan  ASA: II  Anesthesia Plan: General   Post-op Pain Management:    Induction: Intravenous  PONV Risk Score and Plan: 2 and Propofol infusion and TIVA  Airway Management Planned: Natural Airway  Additional Equipment:   Intra-op Plan:   Post-operative Plan:   Informed Consent: I have reviewed the patients History and Physical, chart, labs and discussed the procedure including the risks, benefits and alternatives for the proposed anesthesia with the patient or authorized representative who has indicated his/her understanding and acceptance.     Plan Discussed with: CRNA  Anesthesia Plan Comments:        Anesthesia Quick Evaluation

## 2017-11-06 NOTE — Discharge Instructions (Signed)
General Anesthesia, Adult, Care After °These instructions provide you with information about caring for yourself after your procedure. Your health care provider may also give you more specific instructions. Your treatment has been planned according to current medical practices, but problems sometimes occur. Call your health care provider if you have any problems or questions after your procedure. °What can I expect after the procedure? °After the procedure, it is common to have: °· Vomiting. °· A sore throat. °· Mental slowness. ° °It is common to feel: °· Nauseous. °· Cold or shivery. °· Sleepy. °· Tired. °· Sore or achy, even in parts of your body where you did not have surgery. ° °Follow these instructions at home: °For at least 24 hours after the procedure: °· Do not: °? Participate in activities where you could fall or become injured. °? Drive. °? Use heavy machinery. °? Drink alcohol. °? Take sleeping pills or medicines that cause drowsiness. °? Make important decisions or sign legal documents. °? Take care of children on your own. °· Rest. °Eating and drinking °· If you vomit, drink water, juice, or soup when you can drink without vomiting. °· Drink enough fluid to keep your urine clear or pale yellow. °· Make sure you have little or no nausea before eating solid foods. °· Follow the diet recommended by your health care provider. °General instructions °· Have a responsible adult stay with you until you are awake and alert. °· Return to your normal activities as told by your health care provider. Ask your health care provider what activities are safe for you. °· Take over-the-counter and prescription medicines only as told by your health care provider. °· If you smoke, do not smoke without supervision. °· Keep all follow-up visits as told by your health care provider. This is important. °Contact a health care provider if: °· You continue to have nausea or vomiting at home, and medicines are not helpful. °· You  cannot drink fluids or start eating again. °· You cannot urinate after 8-12 hours. °· You develop a skin rash. °· You have fever. °· You have increasing redness at the site of your procedure. °Get help right away if: °· You have difficulty breathing. °· You have chest pain. °· You have unexpected bleeding. °· You feel that you are having a life-threatening or urgent problem. °This information is not intended to replace advice given to you by your health care provider. Make sure you discuss any questions you have with your health care provider. °Document Released: 05/01/2000 Document Revised: 06/28/2015 Document Reviewed: 01/07/2015 °Elsevier Interactive Patient Education © 2018 [employer] ° °

## 2017-11-07 ENCOUNTER — Ambulatory Visit
Admission: RE | Admit: 2017-11-07 | Discharge: 2017-11-07 | Disposition: A | Discharge status: Home / Self Care | Payer: 59 | Source: Ambulatory Visit | Attending: Gastroenterology | Admitting: Gastroenterology

## 2017-11-07 ENCOUNTER — Ambulatory Visit: Payer: 59 | Admitting: Anesthesiology

## 2017-11-07 ENCOUNTER — Encounter
Admission: RE | Disposition: A | Discharge status: Home / Self Care | Payer: Self-pay | Source: Ambulatory Visit | Attending: Gastroenterology

## 2017-11-07 DIAGNOSIS — R7303 Prediabetes: Secondary | ICD-10-CM | POA: Diagnosis not present

## 2017-11-07 DIAGNOSIS — Z1211 Encounter for screening for malignant neoplasm of colon: Secondary | ICD-10-CM | POA: Diagnosis not present

## 2017-11-07 DIAGNOSIS — I10 Essential (primary) hypertension: Secondary | ICD-10-CM | POA: Diagnosis not present

## 2017-11-07 DIAGNOSIS — K5730 Diverticulosis of large intestine without perforation or abscess without bleeding: Secondary | ICD-10-CM | POA: Insufficient documentation

## 2017-11-07 DIAGNOSIS — Z87891 Personal history of nicotine dependence: Secondary | ICD-10-CM | POA: Insufficient documentation

## 2017-11-07 DIAGNOSIS — Z8249 Family history of ischemic heart disease and other diseases of the circulatory system: Secondary | ICD-10-CM | POA: Insufficient documentation

## 2017-11-07 DIAGNOSIS — Z7984 Long term (current) use of oral hypoglycemic drugs: Secondary | ICD-10-CM | POA: Diagnosis not present

## 2017-11-07 DIAGNOSIS — Z79899 Other long term (current) drug therapy: Secondary | ICD-10-CM | POA: Insufficient documentation

## 2017-11-07 HISTORY — PX: COLONOSCOPY WITH PROPOFOL: SHX5780

## 2017-11-07 HISTORY — DX: Gastro-esophageal reflux disease without esophagitis: K21.9

## 2017-11-07 HISTORY — DX: Prediabetes: R73.03

## 2017-11-07 HISTORY — DX: Essential (primary) hypertension: I10

## 2017-11-07 LAB — GLUCOSE, CAPILLARY
Glucose-Capillary: 115 mg/dL — ABNORMAL HIGH (ref 70–99)
Glucose-Capillary: 116 mg/dL — ABNORMAL HIGH (ref 70–99)

## 2017-11-07 SURGERY — COLONOSCOPY WITH PROPOFOL
Anesthesia: General | Site: Rectum

## 2017-11-07 MED ORDER — LIDOCAINE HCL (CARDIAC) PF 100 MG/5ML IV SOSY
PREFILLED_SYRINGE | INTRAVENOUS | Status: DC | PRN
  Administered 2017-11-07: 40 mg via INTRAVENOUS

## 2017-11-07 MED ORDER — STERILE WATER FOR IRRIGATION IR SOLN
Status: DC | PRN
  Administered 2017-11-07: 11:00:00

## 2017-11-07 MED ORDER — ONDANSETRON HCL 4 MG/2ML IJ SOLN: 4.0000 mg | Freq: Once | INTRAMUSCULAR | Status: DC | PRN

## 2017-11-07 MED ORDER — LACTATED RINGERS IV SOLN
INTRAVENOUS | Status: DC
  Administered 2017-11-07: 09:00:00 via INTRAVENOUS

## 2017-11-07 MED ORDER — SODIUM CHLORIDE 0.9 % IV SOLN: INTRAVENOUS | Status: DC

## 2017-11-07 MED ORDER — PROPOFOL 10 MG/ML IV BOLUS
INTRAVENOUS | Status: DC | PRN
  Administered 2017-11-07: 70 mg via INTRAVENOUS
  Administered 2017-11-07 (×2): 20 mg via INTRAVENOUS
  Administered 2017-11-07: 50 mg via INTRAVENOUS
  Administered 2017-11-07: 100 mg via INTRAVENOUS
  Administered 2017-11-07 (×4): 20 mg via INTRAVENOUS
  Administered 2017-11-07 (×2): 30 mg via INTRAVENOUS

## 2017-11-07 MED ORDER — ACETAMINOPHEN 160 MG/5ML PO SOLN: 325.0000 mg | ORAL | Status: DC | PRN

## 2017-11-07 MED ORDER — ACETAMINOPHEN 325 MG PO TABS: 650.0000 mg | ORAL_TABLET | Freq: Once | ORAL | Status: DC | PRN

## 2017-11-07 SURGICAL SUPPLY — 7 items
CANISTER SUCT 1200ML W/VALVE (MISCELLANEOUS) ×2 IMPLANT
COLOWRAP LRG (MISCELLANEOUS) ×2
COMPRESSION COLOWRAP LRG (MISCELLANEOUS) ×1 IMPLANT
GOWN CVR UNV OPN BCK APRN NK (MISCELLANEOUS) ×2 IMPLANT
GOWN ISOL THUMB LOOP REG UNIV (MISCELLANEOUS) ×2
KIT ENDO PROCEDURE OLY (KITS) ×2 IMPLANT
WATER STERILE IRR 250ML POUR (IV SOLUTION) ×2 IMPLANT

## 2017-11-07 NOTE — H&P (Signed)
Jeremy Repress, MD [address]  [address]  [place], [place] [address]  Main: [phone]  Fax: [phone] Pager: [phone]  Primary Care Physician:  Smitty Cords, DO Primary Gastroenterologist:  Dr. Arlyss [name]  Pre-Procedure History & Physical: HPI:  Jeremy [name] is a 57 y.o. male is here for an colonoscopy.   Past Medical History:  Diagnosis Date  . GERD (gastroesophageal reflux disease)   . Hypertension   . Pre-diabetes     Past Surgical History:  Procedure Laterality Date  . COLONOSCOPY    . NASAL SEPTOPLASTY W/ TURBINOPLASTY  2009   Dr [facility]    Prior to Admission medications   Medication Sig Start Date End Date Taking? Authorizing Provider  amLODipine-benazepril (LOTREL) 5-10 MG capsule TAKE 1 CAPSULE BY MOUTH  DAILY 04/02/17  Yes Karamalegos, Netta Neat, DO  atorvastatin (LIPITOR) 20 MG tablet TAKE 1 TABLET BY MOUTH  DAILY 03/19/17  Yes Karamalegos, Netta Neat, DO  cetirizine (ZYRTEC) 10 MG tablet Take 10 mg by mouth daily.   Yes [provider]  metFORMIN (GLUCOPHAGE-XR) 750 MG 24 hr tablet Take 1 tablet (750 mg total) by mouth daily with breakfast. 03/06/17  Yes Karamalegos, Netta Neat, DO  Multiple Vitamin (MULTIVITAMIN) tablet Take 1 tablet by mouth daily. Men's a day   Yes [provider]  fluticasone (FLONASE) 50 MCG/ACT nasal spray Place 2 sprays into both nostrils daily. Use for 4-6 weeks then stop and use seasonally or as needed. Patient not taking: Reported on 10/31/2017 01/17/17   Smitty Cords, DO  hydrocortisone (ANUSOL-HC) 25 MG suppository Place 1 suppository (25 mg total) rectally 2 (two) times daily. For 7 days Patient not taking: Reported on 10/31/2017 07/26/17   Smitty Cords, DO  pantoprazole (PROTONIX) 40 MG tablet Take 1 tablet (40 mg total) by mouth daily before breakfast. Patient not taking: Reported on 10/31/2017 10/03/17   Smitty Cords, DO    Allergies as of  10/24/2017 - Review Complete 10/03/2017  Allergen Reaction Noted  . Cephalexin Other (See Comments) 07/13/2014    Family History  Problem Relation Age of Onset  . Cancer Mother        breast  . Clotting disorder Father   . Heart attack Father   . Cancer Brother        pancreatic  . Heart attack Brother     Social History   Socioeconomic History  . Marital status: Married    Spouse name: Not on file  . Number of children: Not on file  . Years of education: Not on file  . Highest education level: Not on file  Occupational History  . Not on file  Social Needs  . Financial resource strain: Not on file  . Food insecurity:    Worry: Not on file    Inability: Not on file  . Transportation needs:    Medical: Not on file    Non-medical: Not on file  Tobacco Use  . Smoking status: Former Smoker    Packs/day: 1.00    Years: 35.00    Pack years: 35.00    Types: Cigarettes    Last attempt to quit: 01/06/2014    Years since quitting: 3.8  . Smokeless tobacco: Former Engineer, water and Sexual Activity  . Alcohol use: Yes    Alcohol/week: 12.0 standard drinks    Types: 12 Cans of beer per week  . Drug use: No  . Sexual activity: Not on  file  Lifestyle  . Physical activity:    Days per week: Not on file    Minutes per session: Not on file  . Stress: Not on file  Relationships  . Social connections:    Talks on phone: Not on file    Gets together: Not on file    Attends religious service: Not on file    Active member of club or organization: Not on file    Attends meetings of clubs or organizations: Not on file    Relationship status: Not on file  . Intimate partner violence:    Fear of current or ex partner: Not on file    Emotionally abused: Not on file    Physically abused: Not on file    Forced sexual activity: Not on file  Other Topics Concern  . Not on file  Social History Narrative  . Not on file    Review of Systems: See HPI, otherwise negative  ROS  Physical Exam: BP (!) 156/92   Pulse 73   Temp 97.9 F (36.6 C) (Temporal)   Resp 16   Ht 6' (1.829 m)   Wt 94.3 kg   SpO2 100%   BMI 28.21 kg/m  General:   Alert,  pleasant and cooperative in NAD Head:  Normocephalic and atraumatic. Neck:  Supple; no masses or thyromegaly. Lungs:  Clear throughout to auscultation.    Heart:  Regular rate and rhythm. Abdomen:  Soft, nontender and nondistended. Normal bowel sounds, without guarding, and without rebound.   Neurologic:  Alert and  oriented x4;  grossly normal neurologically.  Impression/Plan: QUADRE BRISTOL is here for an colonoscopy to be performed for colon cancer screening  Risks, benefits, limitations, and alternatives regarding  colonoscopy have been reviewed with the patient.  Questions have been answered.  All parties agreeable.   Lannette Donath, MD  11/07/2017, 11:08 AM

## 2017-11-07 NOTE — Anesthesia Procedure Notes (Signed)
Procedure Name: MAC Date/Time: 11/07/2017 11:19 AM Performed by: Janna Arch, CRNA Pre-anesthesia Checklist: Patient identified, Emergency Drugs available, Suction available, Timeout performed and Patient being monitored Patient Re-evaluated:Patient Re-evaluated prior to induction Oxygen Delivery Method: Nasal cannula Placement Confirmation: positive ETCO2

## 2017-11-07 NOTE — Transfer of Care (Signed)
Immediate Anesthesia Transfer of Care Note  Patient: Jeremy [name]  Procedure(s) Performed: COLONOSCOPY WITH PROPOFOL (N/A Rectum)  Patient Location: PACU  Anesthesia Type: General  Level of Consciousness: awake, alert  and patient cooperative  Airway and Oxygen Therapy: Patient Spontanous Breathing and Patient connected to supplemental oxygen  Post-op Assessment: Post-op Vital signs reviewed, Patient's Cardiovascular Status Stable, Respiratory Function Stable, Patent Airway and No signs of Nausea or vomiting  Post-op Vital Signs: Reviewed and stable  Complications: No apparent anesthesia complications

## 2017-11-07 NOTE — Op Note (Signed)
[facility] Patient Name: Jeremy [name] Procedure Date: 11/07/2017 11:07 AM MRN: [id number] Account #: [id number] Date of Birth: 1960/06/26 Admit Type: Outpatient Age: 57 Room: [facility] OR ROOM 01 Gender: Male Note Status: Finalized Procedure:            Colonoscopy Indications:          Screening for colorectal malignant neoplasm, Last                        colonoscopy: July 2009 Providers:            Lin Landsman MD, MD Referring MD:         Olin Hauser (Referring MD) Medicines:            Monitored Anesthesia Care Complications:        No immediate complications. Estimated blood loss: None. Procedure:            Pre-Anesthesia Assessment:                       - Prior to the procedure, a History and Physical was                        performed, and patient medications and allergies were                        reviewed. The patient is competent. The risks and                        benefits of the procedure and the sedation options and                        risks were discussed with the patient. All questions                        were answered and informed consent was obtained.                        Patient identification and proposed procedure were                        verified by the physician, the nurse, the                        anesthesiologist, the anesthetist and the technician in                        the pre-procedure area in the procedure room in the                        endoscopy suite. Mental Status Examination: alert and                        oriented. Airway Examination: normal oropharyngeal                        airway and neck mobility. Respiratory Examination:                        clear to auscultation. CV Examination: normal.  Prophylactic Antibiotics: The patient does not require                        prophylactic antibiotics. Prior Anticoagulants: The     patient has taken no previous anticoagulant or                        antiplatelet agents. ASA Grade Assessment: II - A                        patient with mild systemic disease. After reviewing the                        risks and benefits, the patient was deemed in                        satisfactory condition to undergo the procedure. The                        anesthesia plan was to use monitored anesthesia care                        (MAC). Immediately prior to administration of                        medications, the patient was re-assessed for adequacy                        to receive sedatives. The heart rate, respiratory rate,                        oxygen saturations, blood pressure, adequacy of                        pulmonary ventilation, and response to care were                        monitored throughout the procedure. The physical status                        of the patient was re-assessed after the procedure.                       After obtaining informed consent, the colonoscope was                        passed under direct vision. Throughout the procedure,                        the patient's blood pressure, pulse, and oxygen                        saturations were monitored continuously. The was                        introduced through the anus and advanced to the 20 cm                        into the ileum. The colonoscopy was performed without  difficulty. The patient tolerated the procedure well.                        The quality of the bowel preparation was evaluated                        using the BBPS [facility] Bowel Preparation Scale) with                        scores of: Right Colon = 3, Transverse Colon = 3 and                        Left Colon = 3 (entire mucosa seen well with no                        residual staining, small fragments of stool or opaque                        liquid). The total BBPS score equals 9. Findings:       The perianal and digital rectal examinations were normal. Pertinent       negatives include normal sphincter tone and no palpable rectal lesions.      The terminal ileum appeared normal.      Many small-mouthed diverticula were found in the sigmoid colon,       ascending colon and cecum.      The entire examined colon appeared normal.      The retroflexed view of the distal rectum and anal verge was normal and       showed no anal or rectal abnormalities. Impression:           - The examined portion of the ileum was normal.                       - Diverticulosis in the sigmoid colon, in the ascending                        colon and in the cecum.                       - The entire examined colon is normal.                       - The distal rectum and anal verge are normal on                        retroflexion view.                       - No specimens collected. Recommendation:       - Discharge patient to home (with spouse).                       - Resume previous diet today.                       - Continue present medications.                       - Repeat colonoscopy in 10 years for surveillance. Procedure Code(s):    --- Professional ---  B4037, Colorectal cancer screening; colonoscopy on                        individual not meeting criteria for high risk Diagnosis Code(s):    --- Professional ---                       Z12.11, Encounter for screening for malignant neoplasm                        of colon                       K57.30, Diverticulosis of large intestine without                        perforation or abscess without bleeding CPT copyright 2017 American Medical Association. All rights reserved. The codes documented in this report are preliminary and upon coder review may  be revised to meet current compliance requirements. Dr. Ulyess Mort Lin Landsman MD, MD 11/07/2017 11:43:15 AM This report has been signed electronically. Number of  Addenda: 0 Note Initiated On: 11/07/2017 11:07 AM Total Procedure Duration: 0 hours 13 minutes 49 seconds       [facility]

## 2017-11-07 NOTE — Anesthesia Postprocedure Evaluation (Signed)
Anesthesia Post Note  Patient: Jeremy [name]  Procedure(s) Performed: COLONOSCOPY WITH PROPOFOL (N/A Rectum)  Patient location during evaluation: PACU Anesthesia Type: General Level of consciousness: awake and alert, oriented and patient cooperative Pain management: pain level controlled Vital Signs Assessment: post-procedure vital signs reviewed and stable Respiratory status: spontaneous breathing, nonlabored ventilation and respiratory function stable Cardiovascular status: blood pressure returned to baseline and stable Postop Assessment: adequate PO intake Anesthetic complications: no    Reed Breech

## 2017-11-08 ENCOUNTER — Encounter: Payer: Self-pay | Admitting: Gastroenterology

## 2017-11-09 ENCOUNTER — Ambulatory Visit (INDEPENDENT_AMBULATORY_CARE_PROVIDER_SITE_OTHER): Payer: 59

## 2017-11-09 DIAGNOSIS — Z23 Encounter for immunization: Secondary | ICD-10-CM | POA: Diagnosis not present

## 2017-12-19 ENCOUNTER — Other Ambulatory Visit: Payer: Self-pay | Admitting: Family Medicine

## 2017-12-19 DIAGNOSIS — I10 Essential (primary) hypertension: Secondary | ICD-10-CM

## 2017-12-19 DIAGNOSIS — R7303 Prediabetes: Secondary | ICD-10-CM

## 2017-12-19 DIAGNOSIS — E785 Hyperlipidemia, unspecified: Secondary | ICD-10-CM

## 2018-01-10 ENCOUNTER — Ambulatory Visit (INDEPENDENT_AMBULATORY_CARE_PROVIDER_SITE_OTHER): Payer: 59 | Admitting: Family Medicine

## 2018-01-10 ENCOUNTER — Encounter: Payer: Self-pay | Admitting: Family Medicine

## 2018-01-10 VITALS — BP 124/80 | HR 85 | Temp 98.2°F | Resp 16 | Ht 72.0 in | Wt 212.6 lb

## 2018-01-10 DIAGNOSIS — H6982 Other specified disorders of Eustachian tube, left ear: Secondary | ICD-10-CM

## 2018-01-10 DIAGNOSIS — J0100 Acute maxillary sinusitis, unspecified: Secondary | ICD-10-CM | POA: Diagnosis not present

## 2018-01-10 DIAGNOSIS — J302 Other seasonal allergic rhinitis: Secondary | ICD-10-CM

## 2018-01-10 MED ORDER — BENZONATATE 100 MG PO CAPS: 100.0000 mg | ORAL_CAPSULE | Freq: Three times a day (TID) | ORAL | 0 refills | Status: DC | PRN

## 2018-01-10 MED ORDER — FLUTICASONE PROPIONATE 50 MCG/ACT NA SUSP: 2.0000 | Freq: Every day | NASAL | 3 refills | Status: DC

## 2018-01-10 MED ORDER — AMOXICILLIN-POT CLAVULANATE 875-125 MG PO TABS: 1.0000 | ORAL_TABLET | Freq: Two times a day (BID) | ORAL | 0 refills | Status: DC

## 2018-01-10 NOTE — Progress Notes (Signed)
Subjective:    Patient ID: Jeremy [name], male    DOB: January 13, 1961, 57 y.o.   MRN: [id number]  Jeremy [name] is a 57 y.o. male presenting on 01/10/2018 for Sinusitis (onset 12 days has chest congestion Mucinex DM OTC taken not improving Sxs, sore throat, brown, green and red mucus noticed by pateint, fever was last week denies now)  Patient presents for a same day appointment.  HPI   ACUTE SINUSITIS Reports symptoms started about 12 days ago he had sore throat and sinus symptoms, then developed fever and persistent sinus congestion, later developed fever without chills, he was taking Tylenol with some relief. He took recent trip to [place] and few other areas over holidays - Now complaining primarily of chest congestion and nasal congestion still with drainage. It is worse in AM or evening, has frequent cough. - Using behind counter Sudafed q 12 hour, Mucinex-DM - Admits some chest congestion and "raspy" or "wheezing" at times - Denies any nausea vomiting, diarrhea, abdominal pain, body aches  Health Maintenance: UTD Influenza 11/09/17  Depression screen [facility] 2/9 01/10/2018 10/03/2017 03/06/2017  Decreased Interest 0 0 0  Down, Depressed, Hopeless 0 0 0  PHQ - 2 Score 0 0 0    Social History   Tobacco Use  . Smoking status: Former Smoker    Packs/day: 1.00    Years: 35.00    Pack years: 35.00    Types: Cigarettes    Last attempt to quit: 01/06/2014    Years since quitting: 4.0  . Smokeless tobacco: Former Engineer, water Use Topics  . Alcohol use: Yes    Alcohol/week: 12.0 standard drinks    Types: 12 Cans of beer per week  . Drug use: No    Review of Systems Per HPI unless specifically indicated above     Objective:    BP 124/80   Pulse 85   Temp 98.2 F (36.8 C) (Oral)   Resp 16   Ht 6' (1.829 m)   Wt 212 lb 9.6 oz (96.4 kg)   SpO2 96%   BMI 28.83 kg/m   Wt Readings from Last 3 Encounters:  01/10/18 212 lb 9.6 oz (96.4 kg)  11/07/17 208 lb (94.3 kg)    10/03/17 208 lb (94.3 kg)    Physical Exam  Constitutional: He is oriented to person, place, and time. He appears well-developed and well-nourished. No distress.  Well-appearing, comfortable, cooperative  HENT:  Head: Normocephalic and atraumatic.  Mouth/Throat: Oropharynx is clear and moist.  Mild maxillary sinuses tender. Nares patent with some congestion. Bilateral TMs clear without erythema, effusion or bulging. Oropharynx posterior pharyngeal drainage without erythema, exudates, edema or asymmetry.  Eyes: Conjunctivae are normal. Right eye exhibits no discharge. Left eye exhibits no discharge.  Cardiovascular: Normal rate, regular rhythm, normal heart sounds and intact distal pulses.  No murmur heard. Pulmonary/Chest: Effort normal and breath sounds normal. No respiratory distress. He has no wheezes. He has no rales.  Musculoskeletal: Normal range of motion. He exhibits no edema.  Lymphadenopathy:    He has no cervical adenopathy.  Neurological: He is alert and oriented to person, place, and time.  Skin: Skin is warm and dry. No rash noted. He is not diaphoretic. No erythema.  Psychiatric: He has a normal mood and affect. His behavior is normal.  Well groomed, good eye contact, normal speech and thoughts  Nursing note and vitals reviewed.  Results for orders placed or performed during the hospital encounter of 11/07/17  Glucose, capillary  Result Value Ref Range   Glucose-Capillary 116 (H) 70 - 99 mg/dL  Glucose, capillary  Result Value Ref Range   Glucose-Capillary 115 (H) 70 - 99 mg/dL      Assessment & Plan:   Problem List Items Addressed This Visit    Seasonal allergies   Relevant Medications   fluticasone (FLONASE) 50 MCG/ACT nasal spray    Other Visit Diagnoses    Acute non-recurrent maxillary sinusitis    -  Primary   Relevant Medications   amoxicillin-clavulanate (AUGMENTIN) 875-125 MG tablet   fluticasone (FLONASE) 50 MCG/ACT nasal spray   benzonatate  (TESSALON) 100 MG capsule   Eustachian tube dysfunction, left       Relevant Medications   fluticasone (FLONASE) 50 MCG/ACT nasal spray      Consistent with acute maxillary sinusitis, likely initially viral URI vs allergic rhinitis component now with worsening concern for bacterial infection, second sickening, duration >12 days  Plan: 1. Start Augmentin 875-125mg  PO BID x 10 days 2. Restart Flonase 2 sprays in each nostril daily for next 4-6 weeks, then may stop and use seasonally or as needed - Start [employer] Perls take 1 capsule up to 3 times a day as needed for cough - Mucinex PRN 3. Supportive care with nasal saline OTC, hydration 4. Return criteria reviewed   Meds ordered this encounter  Medications  . amoxicillin-clavulanate (AUGMENTIN) 875-125 MG tablet    Sig: Take 1 tablet by mouth 2 (two) times daily. For 10 days    Dispense:  20 tablet    Refill:  0  . fluticasone (FLONASE) 50 MCG/ACT nasal spray    Sig: Place 2 sprays into both nostrils daily. Use for 4-6 weeks then stop and use seasonally or as needed.    Dispense:  16 g    Refill:  3  . benzonatate (TESSALON) 100 MG capsule    Sig: Take 1 capsule (100 mg total) by mouth 3 (three) times daily as needed for cough.    Dispense:  30 capsule    Refill:  0    Follow up plan: Return in about 1 week (around 01/17/2018), or if symptoms worsen or fail to improve, for sinusitis.  Jeremy PilarAlexander Tanisa Lagace, DO [facility] 01/10/2018, 10:37 AM

## 2018-01-10 NOTE — Patient Instructions (Addendum)
Thank you for coming to the office today.  1. It sounds like you have a Sinusitis (Bacterial Infection) - this most likely started as an Upper Respiratory Virus that has settled into an infection. Allergies can also cause this. - Start Augmentin 1 pill twice daily (breakfast and dinner, with food and plenty of water) for 10 days, complete entire course, do not stop early even if feeling better - Start  Flonase 2 sprays in each nostril daily for next 4-6 weeks, then you may stop and use seasonally or as needed  Start Tessalon Perls take 1 capsule up to 3 times a day as needed for cough  Restart Albuterol inhaler for few days every 4-6 hours to help clear  May try OTC Mucinex (or may try Mucinex-DM for cough) up to 3 more days  - Recommend to keep using Nasal Saline spray multiple times a day to help flush out congestion and clear sinuses - Improve hydration by drinking plenty of clear fluids (water, gatorade) to reduce secretions and thin congestion - Congestion draining down throat can cause irritation. May try warm herbal tea with honey, cough drops - Can take Tylenol or Ibuprofen as needed for fevers  If you develop persistent fever >101F for at least 3 consecutive days, headaches with sinus pain or pressure or persistent earache, please schedule a follow-up evaluation within next few days to week.   Please schedule a Follow-up Appointment to: Return in about 1 week (around 01/17/2018), or if symptoms worsen or fail to improve, for sinusitis.  If you have any other questions or concerns, please feel free to call the office or send a message through MyChart. You may also schedule an earlier appointment if necessary.  Additionally, you may be receiving a survey about your experience at our office within a few days to 1 week by e-mail or mail. We value your feedback.  Saralyn PilarAlexander Karamalegos, DO [facility]

## 2018-01-21 ENCOUNTER — Ambulatory Visit
Admission: RE | Admit: 2018-01-21 | Discharge: 2018-01-21 | Disposition: A | Discharge status: Home / Self Care | Payer: 59 | Source: Ambulatory Visit | Attending: Family Medicine | Admitting: Family Medicine

## 2018-01-21 ENCOUNTER — Encounter: Payer: Self-pay | Admitting: Family Medicine

## 2018-01-21 ENCOUNTER — Ambulatory Visit (INDEPENDENT_AMBULATORY_CARE_PROVIDER_SITE_OTHER): Payer: 59 | Admitting: Family Medicine

## 2018-01-21 VITALS — BP 138/82 | HR 94 | Temp 99.0°F | Resp 16 | Ht 72.0 in | Wt 214.0 lb

## 2018-01-21 DIAGNOSIS — J9801 Acute bronchospasm: Secondary | ICD-10-CM

## 2018-01-21 DIAGNOSIS — J209 Acute bronchitis, unspecified: Secondary | ICD-10-CM | POA: Insufficient documentation

## 2018-01-21 DIAGNOSIS — R05 Cough: Secondary | ICD-10-CM | POA: Diagnosis not present

## 2018-01-21 MED ORDER — LEVOFLOXACIN 500 MG PO TABS: 500.0000 mg | ORAL_TABLET | Freq: Every day | ORAL | 0 refills | Status: DC

## 2018-01-21 MED ORDER — PREDNISONE 50 MG PO TABS: 50.0000 mg | ORAL_TABLET | Freq: Every day | ORAL | 0 refills | Status: DC

## 2018-01-21 MED ORDER — GUAIFENESIN-CODEINE 100-10 MG/5ML PO SYRP: 5.0000 mL | ORAL_SOLUTION | Freq: Three times a day (TID) | ORAL | 0 refills | Status: DC | PRN

## 2018-01-21 NOTE — Patient Instructions (Addendum)
Thank you for coming to the office today.  1. It sounds like you had an Upper Respiratory Virus that has settled into a Bronchitis, lower respiratory tract infection. I don't have concerns for pneumonia today, and think that this should gradually improve. Once you are feeling better, the cough may take a few weeks to fully resolve. I do hear wheezing and coarse breath sounds, this may be due to the virus, also could be related to smoking.  Chest X-ray today - stay tuned for results.  Continue OTC treatments as you are.  Once we get results - will likely send levaquin, prednisone, and cough syrup  - Use nasal saline (Simply Saline or Ocean Spray) to flush nasal congestion multiple times a day, may help cough - Drink plenty of fluids to improve congestion  If your symptoms seem to worsen instead of improve over next several days, including significant fever / chills, worsening shortness of breath, worsening wheezing, or nausea / vomiting and can't take medicines - return sooner or go to hospital Emergency Department for more immediate treatment.   Please schedule a Follow-up Appointment to: Return in about 1 week (around 01/28/2018), or if symptoms worsen or fail to improve, for bronchitis cough.  If you have any other questions or concerns, please feel free to call the office or send a message through MyChart. You may also schedule an earlier appointment if necessary.  Additionally, you may be receiving a survey about your experience at our office within a few days to 1 week by e-mail or mail. We value your feedback.  Saralyn PilarAlexander Ares Tegtmeyer, DO [facility], [facility]

## 2018-01-21 NOTE — Progress Notes (Signed)
Subjective:    Patient ID: Jeremy [name], male    DOB: 03/23/1960, 57 y.o.   MRN: [id number]  Jeremy [name] is a 57 y.o. male presenting on 01/21/2018 for Cough (was improved in past but getting worst from 6 days still on antibiotics, exhaustion, nasal congestion)  Patient presents for a same day appointment.  HPI   ACUTE BRONCHITIS / BRONCHOSPASM / PRODUCTIVE COUGH - Last visit with me 01/10/18, for acute sinusitis, treated with Augmentin and Tessalon perls, see prior notes for background information. - Interval update with improvement in sinus symptoms up to 90% better, by day 7-8 on antibiotic, and then he noticed significant change and worsening symptoms, now with more cough. - Today patient reports has had worse cough over past 5-6 days now, more productive cough, coughing spells and wheezing or difficulty breathing at times. Admits still has similar sinus congestion and pressure still, seemed to get worse again after it got better initially but never resolved - Finished Tessalon limited relief Admits fatigue and exhausted, achy at times, low grade temp elevated - Denies fevers, chills, nausea vomiting abdominal pain  Depression screen [facility] 2/9 01/21/2018 01/10/2018 10/03/2017  Decreased Interest 0 0 0  Down, Depressed, Hopeless 0 0 0  PHQ - 2 Score 0 0 0    Social History   Tobacco Use  . Smoking status: Former Smoker    Packs/day: 1.00    Years: 35.00    Pack years: 35.00    Types: Cigarettes    Last attempt to quit: 01/06/2014    Years since quitting: 4.0  . Smokeless tobacco: Former Engineer, water Use Topics  . Alcohol use: Yes    Alcohol/week: 12.0 standard drinks    Types: 12 Cans of beer per week  . Drug use: No    Review of Systems Per HPI unless specifically indicated above     Objective:    Pulse 94   Temp 99 F (37.2 C) (Oral)   Resp 16   Ht 6' (1.829 m)   Wt 214 lb (97.1 kg)   SpO2 97%   BMI 29.02 kg/m   Wt Readings from Last 3 Encounters:   01/21/18 214 lb (97.1 kg)  01/10/18 212 lb 9.6 oz (96.4 kg)  11/07/17 208 lb (94.3 kg)    Physical Exam Vitals signs and nursing note reviewed.  Constitutional:      General: He is not in acute distress.    Appearance: He is well-developed. He is not diaphoretic.     Comments: Now tired appearing, cooperative  HENT:     Head: Normocephalic and atraumatic.     Comments: Mild maxillary sinuses tender. Nares patent without purulence or edema. Bilateral TMs clear without erythema, effusion or bulging. Oropharynx with mild generalized erythema without exudates, edema or asymmetry. Eyes:     General:        Right eye: No discharge.        Left eye: No discharge.     Conjunctiva/sclera: Conjunctivae normal.  Cardiovascular:     Rate and Rhythm: Normal rate and regular rhythm.     Heart sounds: Normal heart sounds. No murmur.  Pulmonary:     Effort: Pulmonary effort is normal. No respiratory distress.     Breath sounds: Wheezing (scattered end expiratory coarse sounds) and rales (bilateral lower lung fields w/ rales vs rhonchi) present.     Comments: Frequent coughing spells, tight breathing, see exam Musculoskeletal: Normal range of motion.  Lymphadenopathy:  Cervical: No cervical adenopathy.  Skin:    General: Skin is warm and dry.     Findings: No erythema or rash.  Neurological:     Mental Status: He is alert and oriented to person, place, and time.  Psychiatric:        Behavior: Behavior normal.    I have personally reviewed the radiology report from 01/21/18 on STAT CXR.  CLINICAL DATA:  Productive cough.  EXAM: CHEST - 2 VIEW  COMPARISON:  Radiographs of January 28, 2016.  FINDINGS: The heart size and mediastinal contours are within normal limits. Both lungs are clear. The visualized skeletal structures are unremarkable.  IMPRESSION: No active cardiopulmonary disease.   Electronically Signed   By: Lupita RaiderJames  Green Jr, M.D.   On: 01/21/2018 12:00      Assessment & Plan:   Problem List Items Addressed This Visit    None    Visit Diagnoses    Acute bronchitis, unspecified organism    -  Primary   Relevant Orders   DG Chest 2 View (Completed)   Acute bronchospasm       Relevant Orders   DG Chest 2 View (Completed)      No orders of the defined types were placed in this encounter.  Consistent with acute worsening bronchitis in setting of recent sinusitis that was treated with antibiotic, now has second sickening, low grade temp, focal lung abnormality rhonchi and wheezing end expiratory, history of bronchospasm in past, but no dx of COPD  Plan: STAT Chest X-ray today - reviewed results, after visit - called patient - negative for pneumonia focal  1. For empiric coverage of lower respiratory vs recurrent sinusitis - Start taking Levaquin antibiotic 500mg  daily x 7 days - Start Prednisone 50mg  daily x 5 days burst treatment given bronchospasm - Rx Virtussin codeine cough syrup PRN cough - May use tessalon perls still PRN, OTC meds Return criteria reviewed, follow-up within 1 week if not improved   Follow up plan: Return in about 1 week (around 01/28/2018), or if symptoms worsen or fail to improve, for bronchitis cough.  Saralyn PilarAlexander Edenilson Austad, DO [facility] 01/21/2018, 11:35 AM

## 2018-01-21 NOTE — Telephone Encounter (Signed)
Please advise 

## 2018-03-09 DIAGNOSIS — E785 Hyperlipidemia, unspecified: Secondary | ICD-10-CM

## 2018-03-09 DIAGNOSIS — R7303 Prediabetes: Secondary | ICD-10-CM

## 2018-03-09 DIAGNOSIS — I10 Essential (primary) hypertension: Secondary | ICD-10-CM

## 2018-03-11 MED ORDER — AMLODIPINE BESY-BENAZEPRIL HCL 5-10 MG PO CAPS: 1.0000 | ORAL_CAPSULE | Freq: Every day | ORAL | 3 refills | Status: DC

## 2018-03-11 MED ORDER — ATORVASTATIN CALCIUM 20 MG PO TABS: 20.0000 mg | ORAL_TABLET | Freq: Every day | ORAL | 3 refills | Status: DC

## 2018-03-11 MED ORDER — METFORMIN HCL ER 750 MG PO TB24: 750.0000 mg | ORAL_TABLET | Freq: Every day | ORAL | 3 refills | Status: DC

## 2018-03-26 NOTE — Telephone Encounter (Signed)
Mychart message

## 2018-07-16 ENCOUNTER — Other Ambulatory Visit: Payer: Self-pay

## 2018-07-16 ENCOUNTER — Other Ambulatory Visit: Payer: 59

## 2018-07-16 DIAGNOSIS — E782 Mixed hyperlipidemia: Secondary | ICD-10-CM

## 2018-07-16 DIAGNOSIS — E663 Overweight: Secondary | ICD-10-CM

## 2018-07-16 DIAGNOSIS — Z0000 Encounter for general adult medical examination without abnormal findings: Secondary | ICD-10-CM

## 2018-07-16 DIAGNOSIS — R7303 Prediabetes: Secondary | ICD-10-CM

## 2018-07-16 DIAGNOSIS — Z125 Encounter for screening for malignant neoplasm of prostate: Secondary | ICD-10-CM

## 2018-07-16 DIAGNOSIS — I10 Essential (primary) hypertension: Secondary | ICD-10-CM

## 2018-07-17 LAB — CBC WITH DIFFERENTIAL/PLATELET
Absolute Monocytes: 510 cells/uL (ref 200–950)
Basophils Absolute: 69 cells/uL (ref 0–200)
Basophils Relative: 1.1 %
Eosinophils Absolute: 239 cells/uL (ref 15–500)
Eosinophils Relative: 3.8 %
HCT: 47.4 % (ref 38.5–50.0)
Hemoglobin: 15.9 g/dL (ref 13.2–17.1)
Lymphs Abs: 2659 cells/uL (ref 850–3900)
MCH: 32.1 pg (ref 27.0–33.0)
MCHC: 33.5 g/dL (ref 32.0–36.0)
MCV: 95.8 fL (ref 80.0–100.0)
MPV: 11.2 fL (ref 7.5–12.5)
Monocytes Relative: 8.1 %
Neutro Abs: 2822 cells/uL (ref 1500–7800)
Neutrophils Relative %: 44.8 %
Platelets: 175 10*3/uL (ref 140–400)
RBC: 4.95 10*6/uL (ref 4.20–5.80)
RDW: 13 % (ref 11.0–15.0)
Total Lymphocyte: 42.2 %
WBC: 6.3 10*3/uL (ref 3.8–10.8)

## 2018-07-17 LAB — COMPLETE METABOLIC PANEL WITH GFR
AG Ratio: 2 (calc) (ref 1.0–2.5)
ALT: 22 U/L (ref 9–46)
AST: 17 U/L (ref 10–35)
Albumin: 4.3 g/dL (ref 3.6–5.1)
Alkaline phosphatase (APISO): 77 U/L (ref 35–144)
BUN: 13 mg/dL (ref 7–25)
CO2: 26 mmol/L (ref 20–32)
Calcium: 9.1 mg/dL (ref 8.6–10.3)
Chloride: 105 mmol/L (ref 98–110)
Creat: 0.79 mg/dL (ref 0.70–1.33)
GFR, Est African American: 115 mL/min/{1.73_m2} (ref >=60)
GFR, Est Non African American: 99 mL/min/{1.73_m2} (ref >=60)
Globulin: 2.1 g/dL (calc) (ref 1.9–3.7)
Glucose, Bld: 107 mg/dL — ABNORMAL HIGH (ref 65–99)
Potassium: 4.2 mmol/L (ref 3.5–5.3)
Sodium: 140 mmol/L (ref 135–146)
Total Bilirubin: 0.6 mg/dL (ref 0.2–1.2)
Total Protein: 6.4 g/dL (ref 6.1–8.1)

## 2018-07-17 LAB — LIPID PANEL
Cholesterol: 168 mg/dL (ref <200)
HDL: 59 mg/dL (ref >=40)
LDL Cholesterol (Calc): 82 mg/dL (calc)
Non-HDL Cholesterol (Calc): 109 mg/dL (calc) (ref <130)
Total CHOL/HDL Ratio: 2.8 (calc) (ref <5.0)
Triglycerides: 167 mg/dL — ABNORMAL HIGH (ref <150)

## 2018-07-17 LAB — HEMOGLOBIN A1C
Hgb A1c MFr Bld: 5.6 % of total Hgb (ref <5.7)
Mean Plasma Glucose: 114 (calc)
eAG (mmol/L): 6.3 (calc)

## 2018-07-17 LAB — PSA: PSA: 1.1 ng/mL (ref <=4.0)

## 2018-07-19 ENCOUNTER — Other Ambulatory Visit: Payer: Self-pay

## 2018-07-19 ENCOUNTER — Encounter: Payer: Self-pay | Admitting: Family Medicine

## 2018-07-19 ENCOUNTER — Other Ambulatory Visit: Payer: Self-pay | Admitting: Family Medicine

## 2018-07-19 ENCOUNTER — Ambulatory Visit (INDEPENDENT_AMBULATORY_CARE_PROVIDER_SITE_OTHER): Payer: 59 | Admitting: Family Medicine

## 2018-07-19 VITALS — BP 142/80 | HR 96 | Temp 98.4°F | Ht 72.0 in | Wt 213.8 lb

## 2018-07-19 DIAGNOSIS — Z0000 Encounter for general adult medical examination without abnormal findings: Secondary | ICD-10-CM

## 2018-07-19 DIAGNOSIS — I10 Essential (primary) hypertension: Secondary | ICD-10-CM | POA: Diagnosis not present

## 2018-07-19 DIAGNOSIS — E663 Overweight: Secondary | ICD-10-CM | POA: Diagnosis not present

## 2018-07-19 DIAGNOSIS — R7303 Prediabetes: Secondary | ICD-10-CM

## 2018-07-19 DIAGNOSIS — E782 Mixed hyperlipidemia: Secondary | ICD-10-CM

## 2018-07-19 DIAGNOSIS — Z125 Encounter for screening for malignant neoplasm of prostate: Secondary | ICD-10-CM

## 2018-07-19 NOTE — Progress Notes (Signed)
Subjective:    Patient ID: Jeremy [name], male    DOB: [birth year], 58 y.o.   MRN: [id number]  Jeremy BeaversJeffrey E Gang is a 58 y.o. male presenting on 07/19/2018 for Annual Exam   HPI   Here for Annual Physical and Lab Review.  Pre-Diabetes / Overweight BMI >29 Last lab shows A1c 5.6, improved from 5.7 to 5.8 He is doing well overall with lifestyle. CBGs:no detailed readings available today, he is monitoring it. Meds:Metformin XR 750mg  daily - tolerating well Currently on ACEi Lifestyle: - weight weight up 4 lbs >6 months Diet - Stable diet, mediterranean heart healthy, whole grains, no red meat , mostly water, no sodas, occasional beer Exercise - increased walking since cannot get to gym, 5-7k steps Denies hypoglycemia  CHRONIC HTN: Recent BP mild elevated. Home readings still normal. Attributes to anxiety. Current Meds -Amlodipine-Benazepril 5-10mg  daily Reports good compliance, took meds today. Tolerating well, w/o complaints.  GERD Doing well, still stable intermittent dosing PPI pantoprazole, if eating certain trigger foods takes, not taking daily. No prior GI EGD. Denies GI red flag symptoms  HYPERLIPIDEMIA: - Reports no concerns. 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 Describes issues with anxiety affecting him more now, but he denies depression, he tries to meditate and he has good support system and religion. He is not interested in medication. Tried OTC unisorm PRN with mild relief but feels groggy on it sometimes. Melatonin did not work for him.  Health Maintenance:  Due for Flu vaccine - will return when in stock.  Prostate CA Screening: Prior PSA / DRE reported normal. Last PSA 1.1 (07/2018), previously 0.4  (09/2017). Currently asymptomatic. No known family history of prostate CA.   Colon CA Screening: Last Colonoscopy on 11/07/17 (done by [facility] Dr Allegra LaiVanga), results with 0 polyp no specimen, good for 10 years. Currently asymptomatic, has occasional hemorrhoid. No known family history of colon CA  [facility] 07/19/2018 01/21/2018 01/10/2018  Decreased Interest 0 0 0  Down, Depressed, Hopeless 0 0 0  PHQ - 2 Score 0 0 0    GAD 7 : Generalized Anxiety Score 07/19/2018  Nervous, Anxious, on Edge 2  Control/stop worrying 1  Worry too much - different things 0  Trouble relaxing 0  Restless 0  Easily annoyed or irritable 0  Afraid - awful might happen 0  Total GAD 7 Score 3  Anxiety Difficulty Not difficult at all    Past Medical History:  Diagnosis Date  . GERD (gastroesophageal reflux disease)   . Hypertension   . Pre-diabetes    Past Surgical History:  Procedure Laterality Date  . COLONOSCOPY    . COLONOSCOPY WITH PROPOFOL N/A 11/07/2017   Procedure: COLONOSCOPY WITH PROPOFOL;  Surgeon: Toney ReilVanga, Rohini Reddy, MD;  [facility];  Service: Endoscopy;  Laterality: N/A;  . NASAL SEPTOPLASTY W/ TURBINOPLASTY  2009   Dr Jenne [facility]   Social History   Socioeconomic History  . Marital status: Married    Spouse name: Not on file  . Number of children: Not on file  . Years of education: Not on file  . Highest education level: Not on file  Occupational History  . Not on file  Social Needs  . Financial resource strain: Not on file  . Food  insecurity    Worry: Not on file    Inability: Not on file  . Transportation needs    Medical: Not on file    Non-medical: Not on file  Tobacco Use  . Smoking status: Former Smoker    Packs/day: 1.00    Years: 35.00    Pack years: 35.00    Types: Cigarettes    Quit date: 01/06/2014    Years since quitting: 4.5  . Smokeless tobacco: Former Engineer, waterUser  Substance and Sexual Activity  . Alcohol use: Yes    Alcohol/week: 12.0  standard drinks    Types: 12 Cans of beer per week  . Drug use: No  . Sexual activity: Not on file  Lifestyle  . Physical activity    Days per week: Not on file    Minutes per session: Not on file  . Stress: Not on file  Relationships  . Social Musicianconnections    Talks on phone: Not on file    Gets together: Not on file    Attends religious service: Not on file    Active member of club or organization: Not on file    Attends meetings of clubs or organizations: Not on file    Relationship status: Not on file  . Intimate partner violence    Fear of current or ex partner: Not on file    Emotionally abused: Not on file    Physically abused: Not on file    Forced sexual activity: Not on file  Other Topics Concern  . Not on file  Social History Narrative  . Not on file   Family History  Problem Relation Age of Onset  . Cancer Mother        breast  . Clotting disorder Father   . Heart attack Father   . Cancer Brother        pancreatic  . Heart attack Brother    Current Outpatient Medications on File Prior to Visit  Medication Sig  . amLODipine-benazepril (LOTREL) 5-10 MG capsule Take 1 capsule by mouth daily.  Marland Kitchen. atorvastatin (LIPITOR) 20 MG tablet Take 1 tablet (20 mg total) by mouth daily.  . metFORMIN (GLUCOPHAGE-XR) 750 MG 24 hr tablet Take 1 tablet (750 mg total) by mouth daily with breakfast.  . Multiple Vitamin (MULTIVITAMIN) tablet Take 1 tablet by mouth daily. Men's a day  . pantoprazole (PROTONIX) 40 MG tablet Take 1 tablet (40 mg total) by mouth daily before breakfast.  . cetirizine (ZYRTEC) 10 MG tablet Take 10 mg by mouth daily.  . fluticasone (FLONASE) 50 MCG/ACT nasal spray Place 2 sprays into both nostrils daily. Use for 4-6 weeks then stop and use seasonally or as needed. (Patient not taking: Reported on 07/19/2018)  . hydrocortisone (ANUSOL-HC) 25 MG suppository Place 1 suppository (25 mg total) rectally 2 (two) times daily. For 7 days (Patient not taking: Reported  on 07/19/2018)   No current facility-administered medications on file prior to visit.     Review of Systems  Constitutional: Negative for activity change, appetite change, chills, diaphoresis, fatigue and fever.  HENT: Negative for congestion and hearing loss.   Eyes: Negative for visual disturbance.  Respiratory: Negative for apnea, cough, chest tightness, shortness of breath and wheezing.   Cardiovascular: Negative for chest pain, palpitations and leg swelling.  Gastrointestinal: Negative for abdominal pain, anal bleeding, blood in stool, constipation, diarrhea, nausea and vomiting.  Endocrine: Negative for cold intolerance.  Genitourinary: Negative for dysuria, frequency and hematuria.  Musculoskeletal: Negative  for arthralgias and neck pain.  Skin: Negative for rash.  Allergic/Immunologic: Negative for environmental allergies.  Neurological: Negative for dizziness, weakness, light-headedness, numbness and headaches.  Hematological: Negative for adenopathy.  Psychiatric/Behavioral: Positive for sleep disturbance. Negative for agitation, behavioral problems and dysphoric mood. The patient is nervous/anxious.    Per HPI unless specifically indicated above      Objective:    BP (!) 142/80 (BP Location: Left Arm, Cuff Size: Normal)   Pulse 96   Temp 98.4 F (36.9 C) (Oral)   Ht 6' (1.829 m)   Wt 213 lb 12.8 oz (97 kg)   BMI 29.00 kg/m   Wt Readings from Last 3 Encounters:  07/19/18 213 lb 12.8 oz (97 kg)  01/21/18 214 lb (97.1 kg)  01/10/18 212 lb 9.6 oz (96.4 kg)    Physical Exam Vitals signs and nursing note reviewed.  Constitutional:      General: He is not in acute distress.    Appearance: He is well-developed. He is not diaphoretic.     Comments: Well-appearing, comfortable, cooperative  HENT:     Head: Normocephalic and atraumatic.  Eyes:     General:        Right eye: No discharge.        Left eye: No discharge.     Conjunctiva/sclera: Conjunctivae normal.      Pupils: Pupils are equal, round, and reactive to light.  Neck:     Musculoskeletal: Normal range of motion and neck supple.     Thyroid: No thyromegaly.     Comments: No carotid bruits heard Cardiovascular:     Rate and Rhythm: Normal rate and regular rhythm.     Heart sounds: Normal heart sounds. No murmur.  Pulmonary:     Effort: Pulmonary effort is normal. No respiratory distress.     Breath sounds: Normal breath sounds. No wheezing or rales.  Abdominal:     General: Bowel sounds are normal. There is no distension.     Palpations: Abdomen is soft. There is no mass.     Tenderness: There is no abdominal tenderness.  Musculoskeletal: Normal range of motion.        General: No tenderness.     Comments: Upper / Lower Extremities: - Normal muscle tone, strength bilateral upper extremities 5/5, lower extremities 5/5  Lymphadenopathy:     Cervical: No cervical adenopathy.  Skin:    General: Skin is warm and dry.     Findings: No erythema or rash.  Neurological:     Mental Status: He is alert and oriented to person, place, and time.     Comments: Distal sensation intact to light touch all extremities  Psychiatric:        Behavior: Behavior normal.     Comments: Well groomed, good eye contact, normal speech and thoughts    Results for orders placed or performed in visit on 07/16/18  PSA  Result Value Ref Range   PSA 1.1 < OR = 4.0 ng/mL  Lipid panel  Result Value Ref Range   Cholesterol 168 <200 mg/dL   HDL 59 > OR = 40 mg/dL   Triglycerides 167 (H) <150 mg/dL   LDL Cholesterol (Calc) 82 mg/dL (calc)   Total CHOL/HDL Ratio 2.8 <5.0 (calc)   Non-HDL Cholesterol (Calc) 109 <130 mg/dL (calc)  COMPLETE METABOLIC PANEL WITH GFR  Result Value Ref Range   Glucose, Bld 107 (H) 65 - 99 mg/dL   BUN 13 7 - 25 mg/dL  Creat 0.79 0.70 - 1.33 mg/dL   GFR, Est Non African American 99 > OR = 60 mL/min/1.50m2   GFR, Est African American 115 > OR = 60 mL/min/1.5m2   BUN/Creatinine Ratio  NOT APPLICABLE 6 - 22 (calc)   Sodium 140 135 - 146 mmol/L   Potassium 4.2 3.5 - 5.3 mmol/L   Chloride 105 98 - 110 mmol/L   CO2 26 20 - 32 mmol/L   Calcium 9.1 8.6 - 10.3 mg/dL   Total Protein 6.4 6.1 - 8.1 g/dL   Albumin 4.3 3.6 - 5.1 g/dL   Globulin 2.1 1.9 - 3.7 g/dL (calc)   AG Ratio 2.0 1.0 - 2.5 (calc)   Total Bilirubin 0.6 0.2 - 1.2 mg/dL   Alkaline phosphatase (APISO) 77 35 - 144 U/L   AST 17 10 - 35 U/L   ALT 22 9 - 46 U/L  CBC with Differential/Platelet  Result Value Ref Range   WBC 6.3 3.8 - 10.8 Thousand/uL   RBC 4.95 4.20 - 5.80 Million/uL   Hemoglobin 15.9 13.2 - 17.1 g/dL   HCT 19.1 47.8 - 29.5 %   MCV 95.8 80.0 - 100.0 fL   MCH 32.1 27.0 - 33.0 pg   MCHC 33.5 32.0 - 36.0 g/dL   RDW 62.1 30.8 - 65.7 %   Platelets 175 140 - 400 Thousand/uL   MPV 11.2 7.5 - 12.5 fL   Neutro Abs 2,822 1,500 - 7,800 cells/uL   Lymphs Abs 2,659 850 - 3,900 cells/uL   Absolute Monocytes 510 200 - 950 cells/uL   Eosinophils Absolute 239 15 - 500 cells/uL   Basophils Absolute 69 0 - 200 cells/uL   Neutrophils Relative % 44.8 %   Total Lymphocyte 42.2 %   Monocytes Relative 8.1 %   Eosinophils Relative 3.8 %   Basophils Relative 1.1 %  Hemoglobin A1c  Result Value Ref Range   Hgb A1c MFr Bld 5.6 <5.7 % of total Hgb   Mean Plasma Glucose 114 (calc)   eAG (mmol/L) 6.3 (calc)      Assessment & Plan:   Problem List Items Addressed This Visit    Essential (primary) hypertension    Mildly elevated initial BP, repeat manual check improved. Attributed to anxiety - Home BP readings normal  No known complications     Plan:  1. Continue current BP regimen - Amlodipine-Benazepril 5-10mg  daily 2. Encourage improved lifestyle - low sodium diet, regular exercise 3. Continue monitor BP outside office, bring readings to next visit, if persistently >140/90 or new symptoms notify office sooner      Hyperlipidemia    Controlled cholesterol statin and improving lifestyle Last lipid panel  07/2018 Calculated ASCVD 10 yr risk score < 7.5%  Plan: 1. Continue current meds - Atorvastatin  daily 2. Encourage improved lifestyle - low carb/cholesterol, reduce portion size, continue improving regular exercise 3. Follow-up yearly now lipids      Overweight with body mass index (BMI) 25.0-29.9    Overweight BMI >29, with some weight loss Continue improving lifestyle diet and exercise Risk factor HTN HLD PreDM      Prediabetes    Well-controlled Pre-DM with A1c down to 5.6,  improved on lifestyle and med Concern with HTN, HLD/TG  Plan:  1. Continue current Metformin XR  daily - future may consider dose reduction again 2. Encourage improved lifestyle - low carb, low sugar diet, reduce portion size, continue improving regular exercise - has shown significant improvement in lifestyle  Yearly A1c  now       Other Visit Diagnoses    Annual physical exam    -  Primary      Updated Health Maintenance information Reviewed recent lab results with patient Encouraged improvement to lifestyle with diet and exercise - Goal of weight loss  No orders of the defined types were placed in this encounter.    Follow up plan: Return in about 1 year (around 07/19/2019) for Annual Physical.  Future labs ordered for 07/2019  Saralyn PilarAlexander Karamalegos, DO [facility] 07/19/2018, 9:13 AM

## 2018-07-19 NOTE — Patient Instructions (Addendum)
Thank you for coming to the office today.  Keep up good work and continue medications.   Sleep Hygiene Recommendations to promote healthy sleep in all patients, especially if symptoms of insomnia are worsening. Due to the nature of sleep rhythms, if your body gets "out of rhythm", it may take some time before your sleep cycle can be "reset".  Please try to follow as many of the following tips as you can, usually there are only a few of these are the primary cause of the problem.  ?To reset your sleep rhythm, go to bed and get up at the same time every day ?Sleep only long enough to feel rested and then get out of bed ?Do not try to force yourself to sleep. If you can't sleep, get out of bed and try again later. ?Avoid naps during the day, unless excessively tired. The more sleeping during the day, then the less sleep your body needs at night.  ?Have coffee, tea, and other foods that have caffeine only in the morning ?Exercise several days a week, but not right before bed ?If you drink alcohol, prefer to have appropriate drink with one meal, but prefer to avoid alcohol in the evening, and bedtime ?If you smoke, avoid smoking, especially in the evening  ?Avoid watching TV or looking at phones, computers, or reading devices ("e-books") that give off light at least 30 minutes before bed. This artificial light sends "awake signals" to your brain and can make it harder to fall asleep. ?Make your bedroom a comfortable place where it is easy to fall asleep: ? Put up shades or special blackout curtains to block light from outside. ? Use a white noise machine to block noise. ? Keep the temperature cool. ?Try your best to solve or at least address your problems before you go to bed ?Use relaxation techniques to manage stress. Ask your health care provider to suggest some techniques that may work well for you. These may include: ? Breathing exercises. ? Routines to release muscle  tension. ? Visualizing peaceful scenes.   DUE for FASTING BLOOD WORK (no food or drink after midnight before the lab appointment, only water or coffee without cream/sugar on the morning of)  SCHEDULE "Lab Only" visit in the morning at the clinic for lab draw in 1 YEAR  - Make sure Lab Only appointment is at about 1 week before your next appointment, so that results will be available  For Lab Results, once available within 2-3 days of blood draw, you can can log in to MyChart online to view your results and a brief explanation. Also, we can discuss results at next follow-up visit.   Please schedule a Follow-up Appointment to: Return in about 1 year (around 07/19/2019) for Annual Physical.  If you have any other questions or concerns, please feel free to call the office or send a message through [place]. You may also schedule an earlier appointment if necessary.  Additionally, you may be receiving a survey about your experience at our office within a few days to 1 week by e-mail or mail. We value your feedback.  Nobie Putnam, DO [place]

## 2018-07-19 NOTE — Assessment & Plan Note (Signed)
Well-controlled Pre-DM with A1c down to 5.6,  improved on lifestyle and med Concern with HTN, HLD/TG  Plan:  1. Continue current Metformin XR 750mg  daily - future may consider dose reduction again 2. Encourage improved lifestyle - low carb, low sugar diet, reduce portion size, continue improving regular exercise - has shown significant improvement in lifestyle  Yearly A1c now

## 2018-07-19 NOTE — Assessment & Plan Note (Addendum)
Mildly elevated initial BP, repeat manual check improved. Attributed to anxiety - Home BP readings normal  No known complications     Plan:  1. Continue current BP regimen - Amlodipine-Benazepril 5-10mg  daily 2. Encourage improved lifestyle - low sodium diet, regular exercise 3. Continue monitor BP outside office, bring readings to next visit, if persistently >140/90 or new symptoms notify office sooner

## 2018-07-19 NOTE — Assessment & Plan Note (Signed)
Overweight BMI >29, with some weight loss Continue improving lifestyle diet and exercise Risk factor HTN HLD PreDM

## 2018-07-19 NOTE — Assessment & Plan Note (Signed)
Controlled cholesterol statin and improving lifestyle Last lipid panel 07/2018 Calculated ASCVD 10 yr risk score < 7.5%  Plan: 1. Continue current meds - Atorvastatin 20mg  daily 2. Encourage improved lifestyle - low carb/cholesterol, reduce portion size, continue improving regular exercise 3. Follow-up yearly now lipids

## 2018-07-22 DIAGNOSIS — R7303 Prediabetes: Secondary | ICD-10-CM

## 2018-07-23 MED ORDER — METFORMIN HCL 500 MG PO TABS: 500.0000 mg | ORAL_TABLET | Freq: Every day | ORAL | 1 refills | Status: DC

## 2018-07-23 NOTE — Addendum Note (Signed)
Addended by: Olin Hauser on: 07/23/2018 08:34 AM   Modules accepted: Orders

## 2018-08-26 ENCOUNTER — Other Ambulatory Visit: Payer: Self-pay | Admitting: Family Medicine

## 2018-08-26 DIAGNOSIS — K219 Gastro-esophageal reflux disease without esophagitis: Secondary | ICD-10-CM

## 2018-08-26 MED ORDER — PANTOPRAZOLE SODIUM 40 MG PO TBEC: 40.0000 mg | DELAYED_RELEASE_TABLET | Freq: Every day | ORAL | 1 refills | Status: DC

## 2018-09-30 ENCOUNTER — Telehealth: Payer: Self-pay | Admitting: *Deleted

## 2018-09-30 NOTE — Telephone Encounter (Signed)
Left message for patient to notify them that it is time to schedule annual low dose lung cancer screening CT scan. Instructed patient to call back to verify information prior to the scan being scheduled.  

## 2018-10-01 ENCOUNTER — Encounter: Payer: Self-pay | Admitting: Family Medicine

## 2018-10-01 ENCOUNTER — Ambulatory Visit (INDEPENDENT_AMBULATORY_CARE_PROVIDER_SITE_OTHER): Payer: 59 | Admitting: Family Medicine

## 2018-10-01 ENCOUNTER — Other Ambulatory Visit: Payer: Self-pay

## 2018-10-01 VITALS — BP 150/78 | HR 77 | Temp 98.3°F | Resp 16 | Ht 72.0 in | Wt 218.0 lb

## 2018-10-01 DIAGNOSIS — H00022 Hordeolum internum right lower eyelid: Secondary | ICD-10-CM

## 2018-10-01 DIAGNOSIS — Z23 Encounter for immunization: Secondary | ICD-10-CM

## 2018-10-01 MED ORDER — ERYTHROMYCIN 5 MG/GM OP OINT: 1.0000 "application " | TOPICAL_OINTMENT | Freq: Three times a day (TID) | OPHTHALMIC | 0 refills | Status: DC

## 2018-10-01 NOTE — Progress Notes (Signed)
Subjective:    Patient ID: Jeremy [name], male    DOB: March 10, 1960, 58 y.o.   MRN: [id number]  Jeremy [name] is a 58 y.o. male presenting on 10/01/2018 for Eye Problem (Right eye swelling since Sunday. )  Patient presents for a same day appointment.  HPI   Right Eye Stye, lower lid Reports new problem onset 2 days ago with R eye irritation and swelling discomfort. He said was cutting some plywood, had protective eyewear without side shield and thinks some small amt of sawdust got in R eye caused irritation, also he was not sure if any topical metronidazole cream got into his eye. He could feel a small stye in lower eyelid. He has tried some OTC herbal stye drops with mild relief from irritation, has not had any significant pain, but has some swelling under R eyelid as well. = Denies any fever chills nausea vomiting headache loss of vision, symptoms from his left eye   Depression screen [facility] 2/9 10/01/2018 07/19/2018 01/21/2018  Decreased Interest 0 0 0  Down, Depressed, Hopeless 0 0 0  PHQ - 2 Score 0 0 0    Social History   Tobacco Use  . Smoking status: Former Smoker    Packs/day: 1.00    Years: 35.00    Pack years: 35.00    Types: Cigarettes    Quit date: 01/06/2014    Years since quitting: 4.7  . Smokeless tobacco: Former Engineer, water Use Topics  . Alcohol use: Yes    Alcohol/week: 12.0 standard drinks    Types: 12 Cans of beer per week  . Drug use: No    Review of Systems Per HPI unless specifically indicated above     Objective:    BP (!) 150/78   Pulse 77   Temp 98.3 F (36.8 C) (Oral)   Resp 16   Ht 6' (1.829 m)   Wt 218 lb (98.9 kg)   SpO2 99%   BMI 29.57 kg/m   Wt Readings from Last 3 Encounters:  10/01/18 218 lb (98.9 kg)  07/19/18 213 lb 12.8 oz (97 kg)  01/21/18 214 lb (97.1 kg)    Physical Exam Vitals signs and nursing note reviewed.  Constitutional:      General: He is not in acute distress.    Appearance: He is well-developed. He is  not diaphoretic.     Comments: Well-appearing, comfortable, cooperative  HENT:     Head: Normocephalic and atraumatic.  Eyes:     General:        Right eye: No discharge.        Left eye: No discharge.     Extraocular Movements: Extraocular movements intact.     Conjunctiva/sclera: Conjunctivae normal.     Pupils: Pupils are equal, round, and reactive to light.     Comments: Right eye Lower lid inner medial aspect with visible and palpable small 1 mm aprox stye slight white color to the cystic structure vs pustule, no firm nodule or cyst palpable  Lower area under R eye with some soft dependent edema without erythema or palpable nodule  Cardiovascular:     Rate and Rhythm: Normal rate.  Pulmonary:     Effort: Pulmonary effort is normal.  Skin:    General: Skin is warm and dry.     Findings: No erythema or rash.  Neurological:     Mental Status: He is alert and oriented to person, place, and time.  Psychiatric:  Behavior: Behavior normal.     Comments: Well groomed, good eye contact, normal speech and thoughts      Results for orders placed or performed in visit on 07/16/18  PSA  Result Value Ref Range   PSA 1.1 < OR = 4.0 ng/mL  Lipid panel  Result Value Ref Range   Cholesterol 168 <200 mg/dL   HDL 59 > OR = 40 mg/dL   Triglycerides 167 (H) <150 mg/dL   LDL Cholesterol (Calc) 82 mg/dL (calc)   Total CHOL/HDL Ratio 2.8 <5.0 (calc)   Non-HDL Cholesterol (Calc) 109 <130 mg/dL (calc)  COMPLETE METABOLIC PANEL WITH GFR  Result Value Ref Range   Glucose, Bld 107 (H) 65 - 99 mg/dL   BUN 13 7 - 25 mg/dL   Creat 0.79 0.70 - 1.33 mg/dL   GFR, Est Non African American 99 > OR = 60 mL/min/1.96m2   GFR, Est African American 115 > OR = 60 mL/min/1.37m2   BUN/Creatinine Ratio NOT APPLICABLE 6 - 22 (calc)   Sodium 140 135 - 146 mmol/L   Potassium 4.2 3.5 - 5.3 mmol/L   Chloride 105 98 - 110 mmol/L   CO2 26 20 - 32 mmol/L   Calcium 9.1 8.6 - 10.3 mg/dL   Total Protein 6.4  6.1 - 8.1 g/dL   Albumin 4.3 3.6 - 5.1 g/dL   Globulin 2.1 1.9 - 3.7 g/dL (calc)   AG Ratio 2.0 1.0 - 2.5 (calc)   Total Bilirubin 0.6 0.2 - 1.2 mg/dL   Alkaline phosphatase (APISO) 77 35 - 144 U/L   AST 17 10 - 35 U/L   ALT 22 9 - 46 U/L  CBC with Differential/Platelet  Result Value Ref Range   WBC 6.3 3.8 - 10.8 Thousand/uL   RBC 4.95 4.20 - 5.80 Million/uL   Hemoglobin 15.9 13.2 - 17.1 g/dL   HCT 47.4 38.5 - 50.0 %   MCV 95.8 80.0 - 100.0 fL   MCH 32.1 27.0 - 33.0 pg   MCHC 33.5 32.0 - 36.0 g/dL   RDW 13.0 11.0 - 15.0 %   Platelets 175 140 - 400 Thousand/uL   MPV 11.2 7.5 - 12.5 fL   Neutro Abs 2,822 1,500 - 7,800 cells/uL   Lymphs Abs 2,659 850 - 3,900 cells/uL   Absolute Monocytes 510 200 - 950 cells/uL   Eosinophils Absolute 239 15 - 500 cells/uL   Basophils Absolute 69 0 - 200 cells/uL   Neutrophils Relative % 44.8 %   Total Lymphocyte 42.2 %   Monocytes Relative 8.1 %   Eosinophils Relative 3.8 %   Basophils Relative 1.1 %  Hemoglobin A1c  Result Value Ref Range   Hgb A1c MFr Bld 5.6 <5.7 % of total Hgb   Mean Plasma Glucose 114 (calc)   eAG (mmol/L) 6.3 (calc)      Assessment & Plan:   Problem List Items Addressed This Visit    None    Visit Diagnoses    Hordeolum internum right lower eyelid    -  Primary   Relevant Medications   erythromycin ophthalmic ointment   Needs flu shot       Relevant Orders   Flu Vaccine QUAD 36+ mos IM (Completed)     Acute R lower medial external hordeolum - new episode Possibly trigger by saw dust irritation No evidence of complication, without conjunctivitis or extending eyelid/peri-orbital erythema or edema.  Plan: 1. Start moist warm compresses over Right eyelid 10-15 min at a time,  up to 4-6 times daily until resolution 2. Start Erythromycin oph ointment Left eye up to 3 times daily up to 2 weeks, may stop after 1 week if resolved 3. Strict return precautions for spreading infection, otherwise if mild improvement but  persistent hordeolum (or develops more hard chalazion) then asked to notify us and we can refer to Ophthalmology for I&D removal 4. Follow-up within 1-2 weeks as needed    Meds ordered this encounter  Medications  . erythromycin ophthalmic ointment    Sig: Place 1 application into the right eye 3 (three) times daily.    Dispense:  3.5 g    Refill:  0    Follow up plan: Return in about 2 weeks (around 10/15/2018), or if symptoms worsen or fail to improve, for stye eye.   Saralyn PilarAlexander Vani Gunner, DO [facility] 10/01/2018, 9:17 AM

## 2018-10-01 NOTE — Patient Instructions (Addendum)
Thank you for coming to the office today.  1. It looks like a Stye or "Hordeoleum" of your eyelid, this is a blocked duct infection, it may come back again if it does not fully resolve. If it develops a more hard or firm cyst, we consider it a "Chalazion" and it is less likely to resolve on it's own - Initial treatment is warm compresses up to 4 to 6 times a day using warm washcloth place over eyelid and apply gentle pressure, hold this on for 10-15 min at a time, can re-warm if cools down - Also given size and redness we will start topical eye antibiotic ointment with Erythromycin use small amount < 1 cm on q-tip, place inside lower eyelid, and move eye around. Use this up to 3 times a day for next 1-2 weeks, may stop if fully resolved after 1 week  It may drain pus and reduce in size, this is ideal, otherwise if significant worsening with spreading of redness or swelling onto eyelid or around face, unable to see, fevers/chills, then please return to clinic sooner or call, may go to Emergency Dept if significant worsening  Otherwise, if improving but not going away after 1-2 weeks, call us back and we will refer you to Ophthalmology to have it removed.   Do not wear any contacts until this heals  Please schedule a follow-up appointment with Dr Parks Ranger within 1-2 weeks if not resolved   Please schedule a Follow-up Appointment to: Return in about 2 weeks (around 10/15/2018), or if symptoms worsen or fail to improve, for stye eye.  If you have any other questions or concerns, please feel free to call the office or send a message through [place]. You may also schedule an earlier appointment if necessary.  Additionally, you may be receiving a survey about your experience at our office within a few days to 1 week by e-mail or mail. We value your feedback.  Nobie Putnam, DO [place]

## 2018-10-02 ENCOUNTER — Telehealth: Payer: Self-pay | Admitting: *Deleted

## 2018-10-02 DIAGNOSIS — Z122 Encounter for screening for malignant neoplasm of respiratory organs: Secondary | ICD-10-CM

## 2018-10-02 DIAGNOSIS — Z87891 Personal history of nicotine dependence: Secondary | ICD-10-CM

## 2018-10-02 NOTE — Telephone Encounter (Signed)
Patient has been notified that annual lung cancer screening low dose CT scan is due currently or will be in near future. Confirmed that patient is within the age range of 55-77, and asymptomatic, (no signs or symptoms of lung cancer). Patient denies illness that would prevent curative treatment for lung cancer if found. Verified smoking history, (former, quit 01/06/14, 35 pack year). The shared decision making visit was done 03/15/16. Patient is agreeable for CT scan being scheduled.

## 2018-10-04 ENCOUNTER — Other Ambulatory Visit: Payer: Self-pay

## 2018-10-04 ENCOUNTER — Ambulatory Visit
Admission: RE | Admit: 2018-10-04 | Discharge: 2018-10-04 | Disposition: A | Discharge status: Home / Self Care | Payer: 59 | Source: Ambulatory Visit | Attending: Oncology | Admitting: Oncology

## 2018-10-04 DIAGNOSIS — Z122 Encounter for screening for malignant neoplasm of respiratory organs: Secondary | ICD-10-CM

## 2018-10-04 DIAGNOSIS — Z87891 Personal history of nicotine dependence: Secondary | ICD-10-CM

## 2018-10-08 ENCOUNTER — Encounter: Payer: Self-pay | Admitting: *Deleted

## 2018-12-12 ENCOUNTER — Other Ambulatory Visit: Payer: Self-pay

## 2018-12-12 ENCOUNTER — Ambulatory Visit (INDEPENDENT_AMBULATORY_CARE_PROVIDER_SITE_OTHER): Payer: 59 | Admitting: Family Medicine

## 2018-12-12 ENCOUNTER — Encounter: Payer: Self-pay | Admitting: Family Medicine

## 2018-12-12 VITALS — BP 140/80 | HR 82 | Temp 98.5°F | Resp 20 | Ht 72.0 in | Wt 217.2 lb

## 2018-12-12 DIAGNOSIS — R748 Abnormal levels of other serum enzymes: Secondary | ICD-10-CM | POA: Diagnosis not present

## 2018-12-12 DIAGNOSIS — R1032 Left lower quadrant pain: Secondary | ICD-10-CM

## 2018-12-12 DIAGNOSIS — K5792 Diverticulitis of intestine, part unspecified, without perforation or abscess without bleeding: Secondary | ICD-10-CM

## 2018-12-12 LAB — CBC WITH DIFFERENTIAL/PLATELET
Absolute Monocytes: 504 cells/uL (ref 200–950)
Basophils Absolute: 69 cells/uL (ref 0–200)
Basophils Relative: 1.1 %
Eosinophils Absolute: 202 cells/uL (ref 15–500)
Eosinophils Relative: 3.2 %
HCT: 48.7 % (ref 38.5–50.0)
Hemoglobin: 16.5 g/dL (ref 13.2–17.1)
Lymphs Abs: 2400 cells/uL (ref 850–3900)
MCH: 31.3 pg (ref 27.0–33.0)
MCHC: 33.9 g/dL (ref 32.0–36.0)
MCV: 92.2 fL (ref 80.0–100.0)
MPV: 12.2 fL (ref 7.5–12.5)
Monocytes Relative: 8 %
Neutro Abs: 3125 cells/uL (ref 1500–7800)
Neutrophils Relative %: 49.6 %
Platelets: 165 10*3/uL (ref 140–400)
RBC: 5.28 10*6/uL (ref 4.20–5.80)
RDW: 12.2 % (ref 11.0–15.0)
Total Lymphocyte: 38.1 %
WBC: 6.3 10*3/uL (ref 3.8–10.8)

## 2018-12-12 LAB — COMPLETE METABOLIC PANEL WITH GFR
AG Ratio: 2 (calc) (ref 1.0–2.5)
ALT: 31 U/L (ref 9–46)
AST: 22 U/L (ref 10–35)
Albumin: 4.7 g/dL (ref 3.6–5.1)
Alkaline phosphatase (APISO): 86 U/L (ref 35–144)
BUN/Creatinine Ratio: 19 (calc) (ref 6–22)
BUN: 13 mg/dL (ref 7–25)
CO2: 24 mmol/L (ref 20–32)
Calcium: 10 mg/dL (ref 8.6–10.3)
Chloride: 106 mmol/L (ref 98–110)
Creat: 0.67 mg/dL — ABNORMAL LOW (ref 0.70–1.33)
GFR, Est African American: 123 mL/min/{1.73_m2} (ref >=60)
GFR, Est Non African American: 106 mL/min/{1.73_m2} (ref >=60)
Globulin: 2.3 g/dL (calc) (ref 1.9–3.7)
Glucose, Bld: 118 mg/dL — ABNORMAL HIGH (ref 65–99)
Potassium: 4.3 mmol/L (ref 3.5–5.3)
Sodium: 139 mmol/L (ref 135–146)
Total Bilirubin: 0.7 mg/dL (ref 0.2–1.2)
Total Protein: 7 g/dL (ref 6.1–8.1)

## 2018-12-12 LAB — LIPASE: Lipase: 87 U/L — ABNORMAL HIGH (ref 7–60)

## 2018-12-12 MED ORDER — CIPROFLOXACIN HCL 500 MG PO TABS: 500.0000 mg | ORAL_TABLET | Freq: Two times a day (BID) | ORAL | 0 refills | Status: DC

## 2018-12-12 MED ORDER — DICYCLOMINE HCL 10 MG PO CAPS: 10.0000 mg | ORAL_CAPSULE | Freq: Three times a day (TID) | ORAL | 0 refills | Status: DC

## 2018-12-12 MED ORDER — METRONIDAZOLE 500 MG PO TABS: 500.0000 mg | ORAL_TABLET | Freq: Three times a day (TID) | ORAL | 0 refills | Status: DC

## 2018-12-12 NOTE — Patient Instructions (Addendum)
Thank you for coming to the office today.  Diverticulitis Start CIpro twice a day for 7 days and Flagyl 3 times a day for 7 days (no alcohol) If need to extend to 10 days that could work, let me know.  Cramping medicine dicyclomine (bentyl) up to 4 times a day or ever 6 hours, as needed.  Lab results STAT today stay tuned.  If severe worsening or not improve over 48 hours, contact us or seek care at hospital ED for further work up and imaging.  Please schedule a Follow-up Appointment to: Return in about 1 week (around 12/19/2018), or if symptoms worsen or fail to improve, for abdominal pain.  If you have any other questions or concerns, please feel free to call the office or send a message through [place]. You may also schedule an earlier appointment if necessary.  Additionally, you may be receiving a survey about your experience at our office within a few days to 1 week by e-mail or mail. We value your feedback.  Nobie Putnam, DO [place]

## 2018-12-12 NOTE — Progress Notes (Addendum)
Subjective:    Patient ID: Jeremy [name], male    DOB: 1960-06-15, 58 y.o.   MRN: [id number]  Jeremy [name] is a 58 y.o. male presenting on 12/12/2018 for Abdominal Pain (x1 week) and Nausea   HPI   ACUTE ABDOMINAL PAIN / CRAMPING / NAUSEA Reports >1 week with gradual onset worsening lower to mid abdominal pain, described with cramping abdominal pain seems to be mild to moderate persistent and constant each day, and discomfort worse after meals, associated with some nausea after meals. Denies burning pain or sharp stabbing. Worse with any meal, not worse with particular foods - S/p appendectomy >30 years ago - He has gallbladder has not had problem with it - He has had prior diagnosis of diverticulitis in past, without significant flare up. - Brother had pancreatic cancer dx in age 5258s, he is concerned about this - Admits loose stool with increased frequency Denies any dark stool or blood in stool, fevers chills, vomiting, loose watery stools, other areas of pain, sweats, body aches.  Past Surgical History:  Procedure Laterality Date   APPENDECTOMY     COLONOSCOPY     COLONOSCOPY WITH PROPOFOL N/A 11/07/2017   Procedure: COLONOSCOPY WITH PROPOFOL;  Surgeon: Toney ReilVanga, Rohini Reddy, MD;  [facility];  Service: Endoscopy;  Laterality: N/A;   NASAL SEPTOPLASTY W/ TURBINOPLASTY  2009   Dr [facility]     [facility] 12/12/2018 10/01/2018 07/19/2018  Decreased Interest 0 0 0  Down, Depressed, Hopeless 0 0 0  PHQ - 2 Score 0 0 0  Altered sleeping 0 - -  Tired, decreased energy 0 - -  Change in appetite 0 - -  Feeling bad or failure about yourself  0 - -  Trouble concentrating 0 - -  Moving slowly or fidgety/restless 0 - -  Suicidal thoughts 0 - -  PHQ-9 Score 0 - -   Family History  Problem Relation Age of Onset   Cancer Mother        breast   Clotting disorder Father    Heart attack Father    Heart attack Brother    Pancreatic cancer  Brother [id number]       pancreatic     Social History   Tobacco Use   Smoking status: Former Smoker    Packs/day: 1.00    Years: 35.00    Pack years: 35.00    Types: Cigarettes    Quit date: 01/06/2014    Years since quitting: 4.9   Smokeless tobacco: Former NeurosurgeonUser  Substance Use Topics   Alcohol use: Yes    Alcohol/week: 12.0 standard drinks    Types: 12 Cans of beer per week   Drug use: No    Review of Systems Per HPI unless specifically indicated above     Objective:    BP 140/80 (BP Location: Right Arm, Patient Position: Sitting, Cuff Size: Normal)    Pulse 82    Temp 98.5 F (36.9 C) (Oral)    Resp 20    Ht 6' (1.829 m)    Wt 217 lb 3.2 oz (98.5 kg)    SpO2 100%    BMI 29.46 kg/m   Wt Readings from Last 3 Encounters:  12/12/18 217 lb 3.2 oz (98.5 kg)  10/04/18 205 lb (93 kg)  10/01/18 218 lb (98.9 kg)    Physical Exam Vitals signs and nursing note reviewed.  Constitutional:      General: He is not in  acute distress.    Appearance: He is well-developed. He is not diaphoretic.     Comments: Well-appearing, comfortable, cooperative  HENT:     Head: Normocephalic and atraumatic.  Eyes:     General:        Right eye: No discharge.        Left eye: No discharge.     Conjunctiva/sclera: Conjunctivae normal.  Neck:     Musculoskeletal: Normal range of motion and neck supple.     Thyroid: No thyromegaly.  Cardiovascular:     Rate and Rhythm: Normal rate and regular rhythm.     Heart sounds: Normal heart sounds. No murmur.  Pulmonary:     Effort: Pulmonary effort is normal. No respiratory distress.     Breath sounds: Normal breath sounds. No wheezing or rales.  Abdominal:     General: Bowel sounds are normal.     Tenderness: There is abdominal tenderness in the epigastric area and left lower quadrant. There is no right CVA tenderness, left CVA tenderness, guarding or rebound. Negative signs include Murphy's sign, Rovsing's sign, McBurney's sign and psoas sign.    Musculoskeletal: Normal range of motion.  Lymphadenopathy:     Cervical: No cervical adenopathy.  Skin:    General: Skin is warm and dry.     Findings: No erythema or rash.  Neurological:     Mental Status: He is alert and oriented to person, place, and time.  Psychiatric:        Behavior: Behavior normal.     Comments: Well groomed, good eye contact, normal speech and thoughts    Results for orders placed or performed in visit on 12/12/18  [facility] - CBC with Differential/Platelet physical  Result Value Ref Range   WBC 6.3 3.8 - 10.8 Thousand/uL   RBC 5.28 4.20 - 5.80 Million/uL   Hemoglobin 16.5 13.2 - 17.1 g/dL   HCT 48.7 38.5 - 50.0 %   MCV 92.2 80.0 - 100.0 fL   MCH 31.3 27.0 - 33.0 pg   MCHC 33.9 32.0 - 36.0 g/dL   RDW 12.2 11.0 - 15.0 %   Platelets 165 140 - 400 Thousand/uL   MPV 12.2 7.5 - 12.5 fL   Neutro Abs 3,125 1,500 - 7,800 cells/uL   Lymphs Abs 2,400 850 - 3,900 cells/uL   Absolute Monocytes 504 200 - 950 cells/uL   Eosinophils Absolute 202 15 - 500 cells/uL   Basophils Absolute 69 0 - 200 cells/uL   Neutrophils Relative % 49.6 %   Total Lymphocyte 38.1 %   Monocytes Relative 8.0 %   Eosinophils Relative 3.2 %   Basophils Relative 1.1 %  [facility] - CMET w/ GFR CMP Complete Metabolic Panel physical  Result Value Ref Range   Glucose, Bld 118 (H) 65 - 99 mg/dL   BUN 13 7 - 25 mg/dL   Creat 0.67 (L) 0.70 - 1.33 mg/dL   GFR, Est Non African American 106 > OR = 60 mL/min/1.56m2   GFR, Est African American 123 > OR = 60 mL/min/1.49m2   BUN/Creatinine Ratio 19 6 - 22 (calc)   Sodium 139 135 - 146 mmol/L   Potassium 4.3 3.5 - 5.3 mmol/L   Chloride 106 98 - 110 mmol/L   CO2 24 20 - 32 mmol/L   Calcium 10.0 8.6 - 10.3 mg/dL   Total Protein 7.0 6.1 - 8.1 g/dL   Albumin 4.7 3.6 - 5.1 g/dL   Globulin 2.3 1.9 - 3.7 g/dL (calc)  AG Ratio 2.0 1.0 - 2.5 (calc)   Total Bilirubin 0.7 0.2 - 1.2 mg/dL   Alkaline phosphatase (APISO) 86 35 - 144 U/L   AST 22 10 - 35 U/L    ALT 31 9 - 46 U/L  Lipase  Result Value Ref Range   Lipase 87 (H) 7 - 60 U/L      Assessment & Plan:   Problem List Items Addressed This Visit    None    Visit Diagnoses    Diverticulitis    -  Primary   Relevant Medications   metroNIDAZOLE (FLAGYL) 500 MG tablet   ciprofloxacin (CIPRO) 500 MG tablet   Other Relevant Orders   [facility] - CBC with Differential/Platelet physical (Completed)   [facility] - CMET w/ GFR CMP Complete Metabolic Panel physical (Completed)   Lipase (Completed)   LLQ abdominal pain       Relevant Medications   metroNIDAZOLE (FLAGYL) 500 MG tablet   ciprofloxacin (CIPRO) 500 MG tablet   dicyclomine (BENTYL) 10 MG capsule   Other Relevant Orders   Lipase (Completed)      Clinically with acute persistent lower abdominal LLQ worse pain with cramping and nausea, loose stools, most consistent with possible acute diverticulitis flare. - Considered viral enteritis as well but doesn't quite seem as consistent based on history - Not consistent with acute pancreatitis but will add lipase - History and Exam not supportive of gallbladder pathology - Fam history of pancreatic cancer - No recent antibiotics less likely C Diff, not watery stool  Plan - STAT labs CMET CBC Lipase - drawn today in office - Start antibiotic empiric coverage - Cipro  BID x 7 days and Flagyl  TID for 7 days (counsel no alcohol), can extend to 10 day if indicated. - Rx Dicyclomine PRN for cramping as advised - Improve hydration - Offer zofran PRN nausea he declined can offer if need - Strict return criteria when to seek care at hospital ED if any significant worsening, low threshold to check an abdominal CT scan in near future if unresolved  F/u on mychart within the week as discussed.   Orders Placed This Encounter  Procedures   [facility] - CBC with Differential/Platelet physical   [facility] - CMET w/ GFR CMP Complete Metabolic Panel physical   Lipase    Meds ordered this encounter   Medications   metroNIDAZOLE (FLAGYL) 500 MG tablet    Sig: Take 1 tablet (500 mg total) by mouth 3 (three) times daily. For 7 days    Dispense:  21 tablet    Refill:  0   ciprofloxacin (CIPRO) 500 MG tablet    Sig: Take 1 tablet (500 mg total) by mouth 2 (two) times daily. For 7 days    Dispense:  14 tablet    Refill:  0   dicyclomine (BENTYL) 10 MG capsule    Sig: Take 1 capsule (10 mg total) by mouth 4 (four) times daily -  before meals and at bedtime.    Dispense:  60 capsule    Refill:  0    Follow up plan: Return in about 1 week (around 12/19/2018), or if symptoms worsen or fail to improve, for abdominal pain.  STAT labs ordered today.  Saralyn Pilar, DO [facility] 12/12/2018, 11:35 AM  -----------------------------------------------------  UPDATE Notes recorded by Smitty Cords, DO on 12/12/2018 at 5:02 PM EST  Called patient with results of STAT labs. Discussed with him.  CBC normal, no sign of acute significant infection.   Chemistry normal. No liver or gallbladder abnormality. Chemistry sodium potassium kidney normal.   Lipase (pancreas enzyme) is mild to moderately elevated at 87. Should be < 60. This is unusual, as we discussed.   We agreed to order a CT Abdomen Pelvis w/ and w/o contrast. Order will be placed tomorrow 11/6/ Friday - STAT in AM and try to schedule same day if possible or as soon as we can early next week.   He was notified that if severe worsening tonight or before this can be scheduled, seek care at hospital ED for evaluation promptly. They can do scan there instead.   Saralyn Pilar, DO  [facility]  [facility]  12/12/2018, 4:59 PM

## 2018-12-13 ENCOUNTER — Ambulatory Visit
Admission: RE | Admit: 2018-12-13 | Discharge: 2018-12-13 | Disposition: A | Discharge status: Home / Self Care | Payer: 59 | Source: Ambulatory Visit | Attending: Family Medicine | Admitting: Family Medicine

## 2018-12-13 ENCOUNTER — Other Ambulatory Visit: Payer: Self-pay | Admitting: Family Medicine

## 2018-12-13 DIAGNOSIS — R1032 Left lower quadrant pain: Secondary | ICD-10-CM | POA: Diagnosis present

## 2018-12-13 DIAGNOSIS — K5792 Diverticulitis of intestine, part unspecified, without perforation or abscess without bleeding: Secondary | ICD-10-CM

## 2018-12-13 DIAGNOSIS — R748 Abnormal levels of other serum enzymes: Secondary | ICD-10-CM

## 2018-12-13 MED ORDER — IOHEXOL 300 MG/ML  SOLN
100.0000 mL | Freq: Once | INTRAMUSCULAR | Status: AC | PRN
  Administered 2018-12-13: 16:00:00 100 mL via INTRAVENOUS

## 2019-01-23 ENCOUNTER — Other Ambulatory Visit: Payer: Self-pay

## 2019-01-23 ENCOUNTER — Ambulatory Visit: Payer: 59 | Attending: Internal Medicine

## 2019-01-23 DIAGNOSIS — Z20822 Contact with and (suspected) exposure to covid-19: Secondary | ICD-10-CM

## 2019-01-24 ENCOUNTER — Other Ambulatory Visit: Payer: Self-pay | Admitting: Family Medicine

## 2019-01-24 DIAGNOSIS — J302 Other seasonal allergic rhinitis: Secondary | ICD-10-CM

## 2019-01-24 DIAGNOSIS — H6982 Other specified disorders of Eustachian tube, left ear: Secondary | ICD-10-CM

## 2019-01-24 LAB — NOVEL CORONAVIRUS, NAA: SARS-CoV-2, NAA: NOT DETECTED

## 2019-01-27 ENCOUNTER — Other Ambulatory Visit: Payer: Self-pay | Admitting: Family Medicine

## 2019-01-27 DIAGNOSIS — R1032 Left lower quadrant pain: Secondary | ICD-10-CM

## 2019-01-29 ENCOUNTER — Other Ambulatory Visit: Payer: Self-pay | Admitting: Family Medicine

## 2019-01-29 DIAGNOSIS — R7303 Prediabetes: Secondary | ICD-10-CM

## 2019-01-29 DIAGNOSIS — E785 Hyperlipidemia, unspecified: Secondary | ICD-10-CM

## 2019-01-29 DIAGNOSIS — I10 Essential (primary) hypertension: Secondary | ICD-10-CM

## 2019-01-29 DIAGNOSIS — K219 Gastro-esophageal reflux disease without esophagitis: Secondary | ICD-10-CM

## 2019-01-29 NOTE — Telephone Encounter (Signed)
Should not be due yet- will leave for Dr. Raliegh Ip

## 2019-02-17 ENCOUNTER — Other Ambulatory Visit: Payer: Self-pay | Admitting: Family Medicine

## 2019-02-17 DIAGNOSIS — H6982 Other specified disorders of Eustachian tube, left ear: Secondary | ICD-10-CM

## 2019-02-17 DIAGNOSIS — J302 Other seasonal allergic rhinitis: Secondary | ICD-10-CM

## 2019-02-20 ENCOUNTER — Ambulatory Visit: Payer: BC Managed Care – PPO | Attending: Internal Medicine

## 2019-02-20 DIAGNOSIS — Z20822 Contact with and (suspected) exposure to covid-19: Secondary | ICD-10-CM | POA: Insufficient documentation

## 2019-02-21 LAB — NOVEL CORONAVIRUS, NAA: SARS-CoV-2, NAA: NOT DETECTED

## 2019-03-13 ENCOUNTER — Other Ambulatory Visit: Payer: Self-pay | Admitting: Family Medicine

## 2019-03-13 DIAGNOSIS — J302 Other seasonal allergic rhinitis: Secondary | ICD-10-CM

## 2019-03-13 DIAGNOSIS — H6982 Other specified disorders of Eustachian tube, left ear: Secondary | ICD-10-CM

## 2019-03-13 LAB — HM HEPATITIS C SCREENING LAB: HM Hepatitis Screen: NEGATIVE

## 2019-04-23 NOTE — Telephone Encounter (Signed)
This is a duplicate message. I called him confirmed his question was already answered.  Saralyn Pilar, DO [facility] 04/23/2019, 1:29 PM

## 2019-06-06 ENCOUNTER — Other Ambulatory Visit: Payer: Self-pay

## 2019-06-06 ENCOUNTER — Other Ambulatory Visit: Payer: BC Managed Care – PPO

## 2019-06-06 DIAGNOSIS — E782 Mixed hyperlipidemia: Secondary | ICD-10-CM

## 2019-06-06 DIAGNOSIS — I10 Essential (primary) hypertension: Secondary | ICD-10-CM

## 2019-06-06 DIAGNOSIS — Z0000 Encounter for general adult medical examination without abnormal findings: Secondary | ICD-10-CM

## 2019-06-06 DIAGNOSIS — R7303 Prediabetes: Secondary | ICD-10-CM

## 2019-06-06 DIAGNOSIS — Z125 Encounter for screening for malignant neoplasm of prostate: Secondary | ICD-10-CM

## 2019-06-07 LAB — COMPLETE METABOLIC PANEL WITH GFR
AG Ratio: 2 (calc) (ref 1.0–2.5)
ALT: 34 U/L (ref 9–46)
AST: 25 U/L (ref 10–35)
Albumin: 4.2 g/dL (ref 3.6–5.1)
Alkaline phosphatase (APISO): 57 U/L (ref 35–144)
BUN/Creatinine Ratio: 21 (calc) (ref 6–22)
BUN: 14 mg/dL (ref 7–25)
CO2: 22 mmol/L (ref 20–32)
Calcium: 9.3 mg/dL (ref 8.6–10.3)
Chloride: 109 mmol/L (ref 98–110)
Creat: 0.67 mg/dL — ABNORMAL LOW (ref 0.70–1.33)
GFR, Est African American: 122 mL/min/{1.73_m2} (ref >=60)
GFR, Est Non African American: 105 mL/min/{1.73_m2} (ref >=60)
Globulin: 2.1 g/dL (calc) (ref 1.9–3.7)
Glucose, Bld: 125 mg/dL — ABNORMAL HIGH (ref 65–99)
Potassium: 4.3 mmol/L (ref 3.5–5.3)
Sodium: 141 mmol/L (ref 135–146)
Total Bilirubin: 0.6 mg/dL (ref 0.2–1.2)
Total Protein: 6.3 g/dL (ref 6.1–8.1)

## 2019-06-07 LAB — CBC WITH DIFFERENTIAL/PLATELET
Absolute Monocytes: 429 cells/uL (ref 200–950)
Basophils Absolute: 29 cells/uL (ref 0–200)
Basophils Relative: 0.5 %
Eosinophils Absolute: 29 cells/uL (ref 15–500)
Eosinophils Relative: 0.5 %
HCT: 47.7 % (ref 38.5–50.0)
Hemoglobin: 16 g/dL (ref 13.2–17.1)
Lymphs Abs: 1815 cells/uL (ref 850–3900)
MCH: 32 pg (ref 27.0–33.0)
MCHC: 33.5 g/dL (ref 32.0–36.0)
MCV: 95.4 fL (ref 80.0–100.0)
MPV: 11.3 fL (ref 7.5–12.5)
Monocytes Relative: 7.4 %
Neutro Abs: 3497 cells/uL (ref 1500–7800)
Neutrophils Relative %: 60.3 %
Platelets: 173 10*3/uL (ref 140–400)
RBC: 5 10*6/uL (ref 4.20–5.80)
RDW: 13.2 % (ref 11.0–15.0)
Total Lymphocyte: 31.3 %
WBC: 5.8 10*3/uL (ref 3.8–10.8)

## 2019-06-07 LAB — HEMOGLOBIN A1C
Hgb A1c MFr Bld: 5.8 % of total Hgb — ABNORMAL HIGH (ref <5.7)
Mean Plasma Glucose: 120 (calc)
eAG (mmol/L): 6.6 (calc)

## 2019-06-07 LAB — LIPID PANEL
Cholesterol: 166 mg/dL (ref <200)
HDL: 66 mg/dL (ref >=40)
LDL Cholesterol (Calc): 85 mg/dL (calc)
Non-HDL Cholesterol (Calc): 100 mg/dL (calc) (ref <130)
Total CHOL/HDL Ratio: 2.5 (calc) (ref <5.0)
Triglycerides: 63 mg/dL (ref <150)

## 2019-06-07 LAB — PSA: PSA: 1.2 ng/mL (ref <=4.0)

## 2019-07-22 ENCOUNTER — Ambulatory Visit (INDEPENDENT_AMBULATORY_CARE_PROVIDER_SITE_OTHER): Payer: BC Managed Care – PPO | Admitting: Family Medicine

## 2019-07-22 ENCOUNTER — Other Ambulatory Visit: Payer: Self-pay

## 2019-07-22 ENCOUNTER — Encounter: Payer: Self-pay | Admitting: Family Medicine

## 2019-07-22 VITALS — BP 130/78 | HR 77 | Temp 97.5°F | Resp 16 | Ht 72.0 in | Wt 207.0 lb

## 2019-07-22 DIAGNOSIS — I10 Essential (primary) hypertension: Secondary | ICD-10-CM

## 2019-07-22 DIAGNOSIS — Z0000 Encounter for general adult medical examination without abnormal findings: Secondary | ICD-10-CM | POA: Diagnosis not present

## 2019-07-22 DIAGNOSIS — E663 Overweight: Secondary | ICD-10-CM

## 2019-07-22 DIAGNOSIS — R7303 Prediabetes: Secondary | ICD-10-CM

## 2019-07-22 DIAGNOSIS — K219 Gastro-esophageal reflux disease without esophagitis: Secondary | ICD-10-CM | POA: Diagnosis not present

## 2019-07-22 DIAGNOSIS — E782 Mixed hyperlipidemia: Secondary | ICD-10-CM

## 2019-07-22 NOTE — Patient Instructions (Addendum)
Thank you for coming to the office today.  Recent Labs    06/06/19 0805  HGBA1C 5.8*   Keep up the good work overall  Let me know if questions from Derm I can help  Keep track on BP in meantime we can follow-up sooner or in 6 months  Please schedule a Follow-up Appointment to: Return in about 6 months (around 01/21/2020) for 6 month follow-up PreDM A1c, HTN.  If you have any other questions or concerns, please feel free to call the office or send a message through MyChart. You may also schedule an earlier appointment if necessary.  Additionally, you may be receiving a survey about your experience at our office within a few days to 1 week by e-mail or mail. We value your feedback.  Saralyn Pilar, DO [facility] [place]

## 2019-07-22 NOTE — Assessment & Plan Note (Signed)
Weight loss with improvement Overweight BMI >28 Continue improving lifestyle diet and exercise Risk factor HTN HLD PreDM

## 2019-07-22 NOTE — Assessment & Plan Note (Signed)
Well-controlled Pre-DM with A1c up to 5.8, still within overall range 5.5 to 6.0,  improved on lifestyle and med Concern with HTN, HLD/TG  Plan:  1. Continue current Metformin IR 500mg  daily 2. Encourage improved lifestyle - low carb, low sugar diet, reduce portion size, continue improving regular exercise - has shown significant improvement in lifestyle  Offered 6 month checkup with repeat A1c

## 2019-07-22 NOTE — Progress Notes (Signed)
Subjective:    Patient ID: Jeremy [name], male    DOB: 08-18-1960, 59 y.o.   MRN: [id number]  Jeremy [name] is a 59 y.o. male presenting on 07/22/2019 for Annual Exam   HPI  Here for Annual Physical and Lab Review.  Pre-Diabetes/ Overweight BMI >28 Last lab shows A1c 5.8, previously A1c 5.7 year ago. He has had range of 5.5 to 6.0 He is doing well overall with lifestyle. CBGs: none lately Meds:Metformin IR 500mg  daily - tolerating well Currently on ACEi Lifestyle: - Weight down 10 lbs in 6 months Diet - Stable diet, mediterranean heart healthy, whole grains, no red meat , mostly water, no sodas, occasional beer Exercise - increased walking Denies hypoglycemia  CHRONIC HTN: Checks home BP readings 130-140. If >140 he will try to calm down with anxiety and it improves Current Meds -Amlodipine-Benazepril 5-10mg  daily Reports good compliance, took meds today. Tolerating well, w/o complaints.  GERD Doing well, still stable intermittent dosing PPI pantoprazole, if eating certain trigger foods takes, not taking daily. No prior GI EGD. Denies GI red flag symptoms  HYPERLIPIDEMIA: - Reportsno concerns. Last lipid panel 07/2019, controlled overall with improvement. - Currently takingAtorvastatin 20mg , tolerating well without side effects or myalgias  Additional concern:  Oral Lichen Planus Followed by [facility] - On Hydrochloroquine and Flagyl for 3 months - Gradual improvement overall. They are discussing MTX and he asks about immunocompromise and COVID vaccine concerns. Next apt 08/07/19   Health Maintenance:  UTD COVID19 Vaccine Moderna 04/2019 and 05/2019  Prostate CA Screening: Prior PSA / DRE reported normal. Last PSA1.2 (07/2019), previously 1.1 in 2020. Currently asymptomatic. No known family history of prostate CA.  Colon CA Screening: Last Colonoscopyon 11/07/17(done by [facility] Dr Marius Ditch), results with0 polyp no specimen, good for10years.  Currently asymptomatic, has occasional hemorrhoid.Noknown family history of colon CA   Depression screen [facility] 2/9 12/12/2018 10/01/2018 07/19/2018  Decreased Interest 0 0 0  Down, Depressed, Hopeless 0 0 0  PHQ - 2 Score 0 0 0  Altered sleeping 0 - -  Tired, decreased energy 0 - -  Change in appetite 0 - -  Feeling bad or failure about yourself  0 - -  Trouble concentrating 0 - -  Moving slowly or fidgety/restless 0 - -  Suicidal thoughts 0 - -  PHQ-9 Score 0 - -    Past Medical History:  Diagnosis Date  . GERD (gastroesophageal reflux disease)   . Hypertension   . Pre-diabetes    Past Surgical History:  Procedure Laterality Date  . APPENDECTOMY    . COLONOSCOPY    . COLONOSCOPY WITH PROPOFOL N/A 11/07/2017   Procedure: COLONOSCOPY WITH PROPOFOL;  Surgeon: Lin Landsman, MD;  Location: [facility];  Service: Endoscopy;  Laterality: N/A;  . NASAL SEPTOPLASTY W/ TURBINOPLASTY  2009   Dr Tami Ribas ENT   Social History   Socioeconomic History  . Marital status: Married    Spouse name: Not on file  . Number of children: Not on file  . Years of education: Not on file  . Highest education level: Not on file  Occupational History  . Not on file  Tobacco Use  . Smoking status: Former Smoker    Packs/day: 1.00    Years: 35.00    Pack years: 35.00    Types: Cigarettes    Quit date: 01/06/2014    Years since quitting: 5.5  . Smokeless tobacco: Former Network engineer  . Vaping  Use: Never used  Substance and Sexual Activity  . Alcohol use: Yes    Alcohol/week: 12.0 standard drinks    Types: 12 Cans of beer per week  . Drug use: No  . Sexual activity: Not on file  Other Topics Concern  . Not on file  Social History Narrative  . Not on file   Social Determinants of Health   Financial Resource Strain:   . Difficulty of Paying Living Expenses:   Food Insecurity:   . Worried About Programme researcher, broadcasting/film/video in the Last Year:   . Barista in the Last Year:     Transportation Needs:   . Freight forwarder (Medical):   Marland Kitchen Lack of Transportation (Non-Medical):   Physical Activity:   . Days of Exercise per Week:   . Minutes of Exercise per Session:   Stress:   . Feeling of Stress :   Social Connections:   . Frequency of Communication with Friends and Family:   . Frequency of Social Gatherings with Friends and Family:   . Attends Religious Services:   . Active Member of Clubs or Organizations:   . Attends Banker Meetings:   Marland Kitchen Marital Status:   Intimate Partner Violence:   . Fear of Current or Ex-Partner:   . Emotionally Abused:   Marland Kitchen Physically Abused:   . Sexually Abused:    Family History  Problem Relation Age of Onset  . Cancer Mother        breast  . Clotting disorder Father   . Heart attack Father   . Heart attack Brother   . Pancreatic cancer Brother 67       pancreatic   Current Outpatient Medications on File Prior to Visit  Medication Sig  . amLODipine-benazepril (LOTREL) 5-10 MG capsule TAKE 1 CAPSULE BY MOUTH EVERY DAY  . atorvastatin (LIPITOR) 20 MG tablet TAKE 1 TABLET BY MOUTH EVERY DAY  . cetirizine (ZYRTEC) 10 MG tablet Take 10 mg by mouth daily.  . clobetasol cream (TEMOVATE) 0.05 % Apply topically.  . fluticasone (FLONASE) 50 MCG/ACT nasal spray PLACE 2 SPRAYS INTO BOTH NOSTRILS DAILY. USE FOR 4-6 WEEKS THEN STOP AND USE SEASONALLY  . hydrocortisone (ANUSOL-HC) 25 MG suppository Place 1 suppository (25 mg total) rectally 2 (two) times daily. For 7 days  . metFORMIN (GLUCOPHAGE) 500 MG tablet TAKE 1 TABLET BY MOUTH EVERY DAY WITH BREAKFAST  . Multiple Vitamin (MULTIVITAMIN) tablet Take 1 tablet by mouth daily. Men's a day  . pantoprazole (PROTONIX) 40 MG tablet Take 1 tablet (40 mg total) by mouth daily as needed.   No current facility-administered medications on file prior to visit.    Review of Systems  Constitutional: Negative for activity change, appetite change, chills, diaphoresis, fatigue  and fever.  HENT: Negative for congestion and hearing loss.   Eyes: Negative for visual disturbance.  Respiratory: Negative for apnea, cough, chest tightness, shortness of breath and wheezing.   Cardiovascular: Negative for chest pain, palpitations and leg swelling.  Gastrointestinal: Negative for abdominal pain, anal bleeding, blood in stool, constipation, diarrhea, nausea and vomiting.  Endocrine: Negative for cold intolerance.  Genitourinary: Negative for difficulty urinating, dysuria, frequency and hematuria.  Musculoskeletal: Negative for arthralgias, back pain and neck pain.  Skin: Negative for rash.  Allergic/Immunologic: Negative for environmental allergies.  Neurological: Negative for dizziness, weakness, light-headedness, numbness and headaches.  Hematological: Negative for adenopathy.  Psychiatric/Behavioral: Negative for behavioral problems, dysphoric mood and sleep disturbance. The patient  is not nervous/anxious.    Per HPI unless specifically indicated above     Objective:    BP 130/78 (BP Location: Left Arm, Cuff Size: Normal)   Pulse 77   Temp (!) 97.5 F (36.4 C) (Temporal)   Resp 16   Ht 6' (1.829 m)   Wt 207 lb (93.9 kg)   SpO2 99%   BMI 28.07 kg/m   Wt Readings from Last 3 Encounters:  07/22/19 207 lb (93.9 kg)  12/12/18 217 lb 3.2 oz (98.5 kg)  10/04/18 205 lb (93 kg)    Physical Exam Vitals and nursing note reviewed.  Constitutional:      General: He is not in acute distress.    Appearance: He is well-developed. He is not diaphoretic.     Comments: Well-appearing, comfortable, cooperative  HENT:     Head: Normocephalic and atraumatic.  Eyes:     General:        Right eye: No discharge.        Left eye: No discharge.     Conjunctiva/sclera: Conjunctivae normal.     Pupils: Pupils are equal, round, and reactive to light.  Neck:     Thyroid: No thyromegaly.     Vascular: No carotid bruit.  Cardiovascular:     Rate and Rhythm: Normal rate and  regular rhythm.     Heart sounds: Normal heart sounds. No murmur heard.   Pulmonary:     Effort: Pulmonary effort is normal. No respiratory distress.     Breath sounds: Normal breath sounds. No wheezing or rales.  Abdominal:     General: Bowel sounds are normal. There is no distension.     Palpations: Abdomen is soft. There is no mass.     Tenderness: There is no abdominal tenderness.  Musculoskeletal:        General: No tenderness. Normal range of motion.     Cervical back: Normal range of motion and neck supple.     Comments: Upper / Lower Extremities: - Normal muscle tone, strength bilateral upper extremities 5/5, lower extremities 5/5  Lymphadenopathy:     Cervical: No cervical adenopathy.  Skin:    General: Skin is warm and dry.     Findings: No erythema or rash.  Neurological:     Mental Status: He is alert and oriented to person, place, and time.     Comments: Distal sensation intact to light touch all extremities  Psychiatric:        Behavior: Behavior normal.     Comments: Well groomed, good eye contact, normal speech and thoughts      Results for orders placed or performed in visit on 06/06/19  PSA  Result Value Ref Range   PSA 1.2 < OR = 4.0 ng/mL  Lipid panel  Result Value Ref Range   Cholesterol 166 <200 mg/dL   HDL 66 > OR = 40 mg/dL   Triglycerides 63 <440 mg/dL   LDL Cholesterol (Calc) 85 mg/dL (calc)   Total CHOL/HDL Ratio 2.5 <5.0 (calc)   Non-HDL Cholesterol (Calc) 100 <130 mg/dL (calc)  COMPLETE METABOLIC PANEL WITH GFR  Result Value Ref Range   Glucose, Bld 125 (H) 65 - 99 mg/dL   BUN 14 7 - 25 mg/dL   Creat 3.47 (L) 4.25 - 1.33 mg/dL   GFR, Est Non African American 105 > OR = 60 mL/min/1.33m2   GFR, Est African American 122 > OR = 60 mL/min/1.9m2   BUN/Creatinine Ratio 21 6 - 22 (  calc)   Sodium 141 135 - 146 mmol/L   Potassium 4.3 3.5 - 5.3 mmol/L   Chloride 109 98 - 110 mmol/L   CO2 22 20 - 32 mmol/L   Calcium 9.3 8.6 - 10.3 mg/dL   Total  Protein 6.3 6.1 - 8.1 g/dL   Albumin 4.2 3.6 - 5.1 g/dL   Globulin 2.1 1.9 - 3.7 g/dL (calc)   AG Ratio 2.0 1.0 - 2.5 (calc)   Total Bilirubin 0.6 0.2 - 1.2 mg/dL   Alkaline phosphatase (APISO) 57 35 - 144 U/L   AST 25 10 - 35 U/L   ALT 34 9 - 46 U/L  CBC with Differential/Platelet  Result Value Ref Range   WBC 5.8 3.8 - 10.8 Thousand/uL   RBC 5.00 4.20 - 5.80 Million/uL   Hemoglobin 16.0 13.2 - 17.1 g/dL   HCT 80.9 38 - 50 %   MCV 95.4 80.0 - 100.0 fL   MCH 32.0 27.0 - 33.0 pg   MCHC 33.5 32.0 - 36.0 g/dL   RDW 98.3 38.2 - 50.5 %   Platelets 173 140 - 400 Thousand/uL   MPV 11.3 7.5 - 12.5 fL   Neutro Abs 3,497 1,500 - 7,800 cells/uL   Lymphs Abs 1,815 850 - 3,900 cells/uL   Absolute Monocytes 429 200 - 950 cells/uL   Eosinophils Absolute 29 15 - 500 cells/uL   Basophils Absolute 29 0 - 200 cells/uL   Neutrophils Relative % 60.3 %   Total Lymphocyte 31.3 %   Monocytes Relative 7.4 %   Eosinophils Relative 0.5 %   Basophils Relative 0.5 %  Hemoglobin A1c  Result Value Ref Range   Hgb A1c MFr Bld 5.8 (H) <5.7 % of total Hgb   Mean Plasma Glucose 120 (calc)   eAG (mmol/L) 6.6 (calc)      Assessment & Plan:   Problem List Items Addressed This Visit    Prediabetes    Well-controlled Pre-DM with A1c up to 5.8, still within overall range 5.5 to 6.0,  improved on lifestyle and med Concern with HTN, HLD/TG  Plan:  1. Continue current Metformin IR 500mg  daily 2. Encourage improved lifestyle - low carb, low sugar diet, reduce portion size, continue improving regular exercise - has shown significant improvement in lifestyle  Offered 6 month checkup with repeat A1c      Overweight with body mass index (BMI) 25.0-29.9    Weight loss with improvement Overweight BMI >28 Continue improving lifestyle diet and exercise Risk factor HTN HLD PreDM      Hyperlipidemia    Controlled cholesterol statin and improving lifestyle Last lipid panel 05/2019 Calculated ASCVD 10 yr risk  score < 7.5%  Plan: 1. Continue current meds - Atorvastatin 20mg  daily 2. Encourage improved lifestyle - low carb/cholesterol, reduce portion size, continue improving regular exercise 3. Follow-up yearly now lipids      GERD (gastroesophageal reflux disease)    Controlled on intermittent PPI use Keep on current med Pantoprazole 40mg  daily PRN no new orders, has med supply      Relevant Medications   pantoprazole (PROTONIX) 40 MG tablet   Essential (primary) hypertension    Again, Mildly elevated initial BP, repeat manual check improved. Attributed to anxiety - Home BP readings normal  No known complications     Plan:  1. Continue current BP regimen - Amlodipine-Benazepril 5-10mg  daily 2. Encourage improved lifestyle - low sodium diet, regular exercise 3. Continue monitor BP outside office, bring readings to next visit,  if persistently >140/90 or new symptoms notify office sooner       Other Visit Diagnoses    Annual physical exam    -  Primary     Updated Health Maintenance information - PSA yearly - Colonoscopy 2029, after 10 year repeat recommended in 2019. Reviewed recent lab results with patient Encouraged improvement to lifestyle with diet and exercise - Goal of weight loss    No orders of the defined types were placed in this encounter.    Follow up plan: Return in about 6 months (around 01/21/2020) for 6 month follow-up PreDM A1c, HTN.  Saralyn PilarAlexander Makaleigh Reinard, DO [facility] 07/22/2019, 8:38 AM

## 2019-07-22 NOTE — Assessment & Plan Note (Signed)
Controlled on intermittent PPI use Keep on current med Pantoprazole 40mg  daily PRN no new orders, has med supply

## 2019-07-22 NOTE — Assessment & Plan Note (Signed)
Again, Mildly elevated initial BP, repeat manual check improved. Attributed to anxiety - Home BP readings normal  No known complications     Plan:  1. Continue current BP regimen - Amlodipine-Benazepril 5-10mg  daily 2. Encourage improved lifestyle - low sodium diet, regular exercise 3. Continue monitor BP outside office, bring readings to next visit, if persistently >140/90 or new symptoms notify office sooner

## 2019-07-22 NOTE — Assessment & Plan Note (Signed)
Controlled cholesterol statin and improving lifestyle Last lipid panel 05/2019 Calculated ASCVD 10 yr risk score < 7.5%  Plan: 1. Continue current meds - Atorvastatin 20mg  daily 2. Encourage improved lifestyle - low carb/cholesterol, reduce portion size, continue improving regular exercise 3. Follow-up yearly now lipids

## 2019-07-26 ENCOUNTER — Other Ambulatory Visit: Payer: Self-pay | Admitting: Family Medicine

## 2019-07-26 DIAGNOSIS — R7303 Prediabetes: Secondary | ICD-10-CM

## 2019-07-26 NOTE — Telephone Encounter (Signed)
Requested Prescriptions  °Pending Prescriptions Disp Refills  °• metFORMIN (GLUCOPHAGE) 500 MG tablet [Pharmacy Med Name: METFORMIN HCL 500 MG TABLET] 90 tablet 1  °  Sig: TAKE 1 TABLET BY MOUTH EVERY DAY WITH BREAKFAST  °  ° Endocrinology:  Diabetes - Biguanides Failed - 07/26/2019  9:35 AM  °  °  Failed - Cr in normal range and within 360 days  °  Creat  °Date Value Ref Range Status  °06/06/2019 0.67 (L) 0.70 - 1.33 mg/dL Final  °  Comment:  °  For patients >49 years of age, the reference limit °for Creatinine is approximately 13% higher for people °identified as African-American. °. °  °   °  °  Passed - HBA1C is between 0 and 7.9 and within 180 days  °  Hgb A1c MFr Bld  °Date Value Ref Range Status  °06/06/2019 5.8 (H) <5.7 % of total Hgb Final  °  Comment:  °  For someone without known diabetes, a hemoglobin  °A1c value between 5.7% and 6.4% is consistent with °prediabetes and should be confirmed with a  °follow-up test. °. °For someone with known diabetes, a value <7% °indicates that their diabetes is well controlled. A1c °targets should be individualized based on duration of °diabetes, age, comorbid conditions, and other °considerations. °. °This assay result is consistent with an increased risk °of diabetes. °. °Currently, no consensus exists regarding use of °hemoglobin A1c for diagnosis of diabetes for children. °. °  °   °  °  Passed - eGFR in normal range and within 360 days  °  GFR, Est African American  °Date Value Ref Range Status  °06/06/2019 122 > OR = 60 mL/min/1.73m2 Final  ° °GFR, Est Non African American  °Date Value Ref Range Status  °06/06/2019 105 > OR = 60 mL/min/1.73m2 Final  °   °  °  Passed - Valid encounter within last 6 months  °  Recent Outpatient Visits   °      ° 4 days ago Annual physical exam  ° [facility] Karamalegos, Alexander J, DO  ° 7 months ago Diverticulitis  ° [facility] Karamalegos, Alexander J, DO  ° 9 months ago Hordeolum internum right  lower eyelid  ° [facility] Karamalegos, Alexander J, DO  ° 1 year ago Annual physical exam  ° [facility] Karamalegos, Alexander J, DO  ° 1 year ago Acute bronchitis, unspecified organism  ° [facility] Karamalegos, Alexander J, DO  °  °  °Future Appointments   °        ° In 5 months Karamalegos, Alexander J, DO [facility]  °  ° °  °  °  ° ° °

## 2019-07-27 ENCOUNTER — Other Ambulatory Visit: Payer: Self-pay | Admitting: Family Medicine

## 2019-07-27 DIAGNOSIS — K219 Gastro-esophageal reflux disease without esophagitis: Secondary | ICD-10-CM

## 2019-09-16 ENCOUNTER — Telehealth: Payer: Self-pay | Admitting: *Deleted

## 2019-09-16 NOTE — Telephone Encounter (Signed)
Writer spoke with patient on this date to discuss patient's annual lung cancer screening CT scan. Patient stated that he quit smoking about 4-5 years ago, smoking a ppd. Patient stated that he has not had any health issues over the last year and is not being evaluated or treated for cancer. Patient reported that he has new insurance and this information was sent to RN navigator. CT was scheduled for 10-15-19 at 11:15 and patient is aware of appt.

## 2019-09-17 ENCOUNTER — Other Ambulatory Visit: Payer: Self-pay | Admitting: *Deleted

## 2019-09-17 DIAGNOSIS — Z87891 Personal history of nicotine dependence: Secondary | ICD-10-CM

## 2019-09-17 DIAGNOSIS — Z122 Encounter for screening for malignant neoplasm of respiratory organs: Secondary | ICD-10-CM

## 2019-10-08 ENCOUNTER — Other Ambulatory Visit: Payer: Self-pay | Admitting: Sleep Medicine

## 2019-10-08 ENCOUNTER — Other Ambulatory Visit: Payer: Self-pay

## 2019-10-08 DIAGNOSIS — I471 Supraventricular tachycardia: Secondary | ICD-10-CM

## 2019-10-09 LAB — NOVEL CORONAVIRUS, NAA: SARS-CoV-2, NAA: DETECTED — AB

## 2019-10-09 LAB — SPECIMEN STATUS REPORT

## 2019-10-10 ENCOUNTER — Telehealth: Payer: Self-pay | Admitting: Oncology

## 2019-10-10 NOTE — Telephone Encounter (Signed)
Called to Discuss with patient about Covid symptoms and the use of regeneron, a monoclonal antibody infusion for those with mild to moderate Covid symptoms and at a high risk of hospitalization.     Pt is qualified for this infusion at the [facility] infusion center due to co-morbid conditions and/or a member of an at-risk group.    Spoke to patient.  Patient is feeling much better.  He does not believe he needs the infusion at this time.  Mignon Pine AGNP-C [phone] (Infusion Center Hotline)

## 2019-10-11 ENCOUNTER — Telehealth (HOSPITAL_COMMUNITY): Payer: Self-pay | Admitting: Family

## 2019-10-11 DIAGNOSIS — U071 COVID-19: Secondary | ICD-10-CM

## 2019-10-11 NOTE — Telephone Encounter (Signed)
Called  to discuss with Jeremy [name] about Covid symptoms and the use of casirivimab/imdevimab, a combination monoclonal antibody infusion for those with mild to moderate Covid symptoms and at a high risk of hospitalization.     Pt is qualified for this infusion at the infusion center due to co-morbid conditions and/or a member of an at-risk group.  Patient added to today's hotline call list. Per EMR, he has already spoken with a member of the infusion hotline team on 10/10/19, but declined the infusion at that time. Called him today to see if perhaps he had decided to have the infusion. Unable to reach patient, and VM left.   Kharon Hixon,NP

## 2019-10-15 ENCOUNTER — Ambulatory Visit: Payer: BC Managed Care – PPO

## 2019-10-20 ENCOUNTER — Other Ambulatory Visit: Payer: BC Managed Care – PPO

## 2019-10-20 ENCOUNTER — Other Ambulatory Visit: Payer: Self-pay | Admitting: Critical Care Medicine

## 2019-10-20 DIAGNOSIS — Z20822 Contact with and (suspected) exposure to covid-19: Secondary | ICD-10-CM

## 2019-10-21 LAB — NOVEL CORONAVIRUS, NAA: SARS-CoV-2, NAA: NOT DETECTED

## 2019-10-21 LAB — SARS-COV-2, NAA 2 DAY TAT

## 2019-10-29 ENCOUNTER — Ambulatory Visit
Admission: RE | Admit: 2019-10-29 | Discharge: 2019-10-29 | Disposition: A | Discharge status: Home / Self Care | Payer: BC Managed Care – PPO | Source: Ambulatory Visit | Attending: Nurse Practitioner | Admitting: Nurse Practitioner

## 2019-10-29 ENCOUNTER — Encounter: Payer: Self-pay | Admitting: *Deleted

## 2019-10-29 ENCOUNTER — Other Ambulatory Visit: Payer: Self-pay

## 2019-10-29 DIAGNOSIS — Z87891 Personal history of nicotine dependence: Secondary | ICD-10-CM | POA: Diagnosis not present

## 2019-10-29 DIAGNOSIS — Z122 Encounter for screening for malignant neoplasm of respiratory organs: Secondary | ICD-10-CM | POA: Diagnosis present

## 2019-10-30 ENCOUNTER — Telehealth: Payer: Self-pay | Admitting: *Deleted

## 2019-10-30 ENCOUNTER — Other Ambulatory Visit: Payer: Self-pay | Admitting: *Deleted

## 2019-10-30 DIAGNOSIS — Z87891 Personal history of nicotine dependence: Secondary | ICD-10-CM

## 2019-10-30 DIAGNOSIS — R918 Other nonspecific abnormal finding of lung field: Secondary | ICD-10-CM

## 2019-10-30 NOTE — Progress Notes (Signed)
Former smoker quit 01/06/14, 35 pack year

## 2019-10-30 NOTE — Telephone Encounter (Signed)
After review of CT results with Dr. Sarina Ser from [facility] and message sent to PCP, notified patient of LDCT lung cancer screening program results with recommendation for 3 month follow up imaging. Also notified of incidental findings noted below and is encouraged to discuss further with PCP who will receive a copy of this note and/or the CT report. Patient verbalizes understanding.   IMPRESSION: 1. Lung-RADS 4B, suspicious. Additional imaging evaluation or consultation with Pulmonology or Thoracic Surgery recommended. 2. Coronary artery calcifications noted. 3. Emphysema and aortic atherosclerosis.  Aortic Atherosclerosis (ICD10-I70.0) and Emphysema (ICD10-J43.9).

## 2019-11-03 NOTE — Telephone Encounter (Signed)
Called patient to review, did not reach, left voicemail.  His apt with me next is 01/21/20  Advised him that Pulmonology recommend repeat imaging in 3 months, they will call to schedule this.  Per Dr. Sarina Ser from [facility] and she recommends a 3 month follow up since the solid component is likely not large enough to show up on PET scan and the finding could be inflammatory.  Saralyn Pilar, DO [facility] 11/03/2019, 11:33 AM

## 2019-11-12 ENCOUNTER — Inpatient Hospital Stay
Admission: EM | Admit: 2019-11-12 | Discharge: 2019-11-15 | DRG: 389 | Disposition: A | Discharge status: Home / Self Care | Payer: BC Managed Care – PPO | Attending: Internal Medicine | Admitting: Internal Medicine

## 2019-11-12 ENCOUNTER — Other Ambulatory Visit: Payer: Self-pay

## 2019-11-12 ENCOUNTER — Ambulatory Visit (INDEPENDENT_AMBULATORY_CARE_PROVIDER_SITE_OTHER)
Admit: 2019-11-12 | Discharge: 2019-11-12 | Disposition: A | Discharge status: Home / Self Care | Payer: BC Managed Care – PPO | Attending: Family Medicine | Admitting: Family Medicine

## 2019-11-12 ENCOUNTER — Inpatient Hospital Stay: Payer: BC Managed Care – PPO

## 2019-11-12 ENCOUNTER — Ambulatory Visit (INDEPENDENT_AMBULATORY_CARE_PROVIDER_SITE_OTHER)
Admission: EM | Admit: 2019-11-12 | Discharge: 2019-11-12 | Disposition: A | Discharge status: Home / Self Care | Payer: BC Managed Care – PPO | Source: Home / Self Care

## 2019-11-12 DIAGNOSIS — Z9049 Acquired absence of other specified parts of digestive tract: Secondary | ICD-10-CM | POA: Diagnosis not present

## 2019-11-12 DIAGNOSIS — K565 Intestinal adhesions [bands], unspecified as to partial versus complete obstruction: Principal | ICD-10-CM | POA: Diagnosis present

## 2019-11-12 DIAGNOSIS — E871 Hypo-osmolality and hyponatremia: Secondary | ICD-10-CM | POA: Diagnosis present

## 2019-11-12 DIAGNOSIS — R1084 Generalized abdominal pain: Secondary | ICD-10-CM | POA: Insufficient documentation

## 2019-11-12 DIAGNOSIS — Z87891 Personal history of nicotine dependence: Secondary | ICD-10-CM

## 2019-11-12 DIAGNOSIS — K56609 Unspecified intestinal obstruction, unspecified as to partial versus complete obstruction: Secondary | ICD-10-CM | POA: Diagnosis present

## 2019-11-12 DIAGNOSIS — Z8616 Personal history of COVID-19: Secondary | ICD-10-CM | POA: Diagnosis not present

## 2019-11-12 DIAGNOSIS — R112 Nausea with vomiting, unspecified: Secondary | ICD-10-CM | POA: Insufficient documentation

## 2019-11-12 DIAGNOSIS — R109 Unspecified abdominal pain: Secondary | ICD-10-CM

## 2019-11-12 DIAGNOSIS — R7303 Prediabetes: Secondary | ICD-10-CM

## 2019-11-12 DIAGNOSIS — K219 Gastro-esophageal reflux disease without esophagitis: Secondary | ICD-10-CM | POA: Diagnosis present

## 2019-11-12 DIAGNOSIS — I10 Essential (primary) hypertension: Secondary | ICD-10-CM | POA: Diagnosis present

## 2019-11-12 DIAGNOSIS — Z7984 Long term (current) use of oral hypoglycemic drugs: Secondary | ICD-10-CM

## 2019-11-12 DIAGNOSIS — Z79899 Other long term (current) drug therapy: Secondary | ICD-10-CM | POA: Diagnosis not present

## 2019-11-12 DIAGNOSIS — Z23 Encounter for immunization: Secondary | ICD-10-CM | POA: Diagnosis not present

## 2019-11-12 DIAGNOSIS — E785 Hyperlipidemia, unspecified: Secondary | ICD-10-CM | POA: Diagnosis present

## 2019-11-12 LAB — COMPREHENSIVE METABOLIC PANEL
ALT: 22 U/L (ref 0–44)
ALT: 21 U/L (ref 0–44)
AST: 18 U/L (ref 15–41)
AST: 15 U/L (ref 15–41)
Albumin: 4.4 g/dL (ref 3.5–5.0)
Albumin: 4.3 g/dL (ref 3.5–5.0)
Alkaline Phosphatase: 74 U/L (ref 38–126)
Alkaline Phosphatase: 68 U/L (ref 38–126)
Anion gap: 9 (ref 5–15)
Anion gap: 9 (ref 5–15)
BUN: 17 mg/dL (ref 6–20)
BUN: 15 mg/dL (ref 6–20)
CO2: 22 mmol/L (ref 22–32)
CO2: 23 mmol/L (ref 22–32)
Calcium: 9.2 mg/dL (ref 8.9–10.3)
Calcium: 8.8 mg/dL — ABNORMAL LOW (ref 8.9–10.3)
Chloride: 102 mmol/L (ref 98–111)
Chloride: 103 mmol/L (ref 98–111)
Creatinine, Ser: 0.79 mg/dL (ref 0.61–1.24)
Creatinine, Ser: 0.85 mg/dL (ref 0.61–1.24)
GFR calc non Af Amer: >60 mL/min (ref >60)
GFR calc non Af Amer: >60 mL/min (ref >60)
Glucose, Bld: 122 mg/dL — ABNORMAL HIGH (ref 70–99)
Glucose, Bld: 114 mg/dL — ABNORMAL HIGH (ref 70–99)
Potassium: 4.2 mmol/L (ref 3.5–5.1)
Potassium: 4.3 mmol/L (ref 3.5–5.1)
Sodium: 133 mmol/L — ABNORMAL LOW (ref 135–145)
Sodium: 135 mmol/L (ref 135–145)
Total Bilirubin: 1.7 mg/dL — ABNORMAL HIGH (ref 0.3–1.2)
Total Bilirubin: 1.6 mg/dL — ABNORMAL HIGH (ref 0.3–1.2)
Total Protein: 7.8 g/dL (ref 6.5–8.1)
Total Protein: 7.3 g/dL (ref 6.5–8.1)

## 2019-11-12 LAB — CBC
HCT: 47.8 % (ref 39.0–52.0)
Hemoglobin: 17 g/dL (ref 13.0–17.0)
MCH: 32.3 pg (ref 26.0–34.0)
MCHC: 35.6 g/dL (ref 30.0–36.0)
MCV: 90.7 fL (ref 80.0–100.0)
Platelets: 182 10*3/uL (ref 150–400)
RBC: 5.27 MIL/uL (ref 4.22–5.81)
RDW: 13.1 % (ref 11.5–15.5)
WBC: 10.6 10*3/uL — ABNORMAL HIGH (ref 4.0–10.5)
nRBC: 0 % (ref 0.0–0.2)

## 2019-11-12 LAB — URINALYSIS, COMPLETE (UACMP) WITH MICROSCOPIC
Bacteria, UA: NONE SEEN
Glucose, UA: NEGATIVE mg/dL
Hgb urine dipstick: NEGATIVE
Ketones, ur: 80 mg/dL — AB
Leukocytes,Ua: NEGATIVE
Nitrite: NEGATIVE
Protein, ur: NEGATIVE mg/dL
Specific Gravity, Urine: 1.025 (ref 1.005–1.030)
WBC, UA: NONE SEEN WBC/hpf (ref 0–5)
pH: 5.5 (ref 5.0–8.0)

## 2019-11-12 LAB — CBC WITH DIFFERENTIAL/PLATELET
Abs Immature Granulocytes: 0.11 10*3/uL — ABNORMAL HIGH (ref 0.00–0.07)
Basophils Absolute: 0.1 10*3/uL (ref 0.0–0.1)
Basophils Relative: 1 %
Eosinophils Absolute: 0.2 10*3/uL (ref 0.0–0.5)
Eosinophils Relative: 2 %
HCT: 51.1 % (ref 39.0–52.0)
Hemoglobin: 17.7 g/dL — ABNORMAL HIGH (ref 13.0–17.0)
Immature Granulocytes: 1 %
Lymphocytes Relative: 17 %
Lymphs Abs: 1.7 10*3/uL (ref 0.7–4.0)
MCH: 31.9 pg (ref 26.0–34.0)
MCHC: 34.6 g/dL (ref 30.0–36.0)
MCV: 92.2 fL (ref 80.0–100.0)
Monocytes Absolute: 0.8 10*3/uL (ref 0.1–1.0)
Monocytes Relative: 8 %
Neutro Abs: 7.3 10*3/uL (ref 1.7–7.7)
Neutrophils Relative %: 71 %
Platelets: 205 10*3/uL (ref 150–400)
RBC: 5.54 MIL/uL (ref 4.22–5.81)
RDW: 13.2 % (ref 11.5–15.5)
WBC: 10.2 10*3/uL (ref 4.0–10.5)
nRBC: 0 % (ref 0.0–0.2)

## 2019-11-12 LAB — GLUCOSE, CAPILLARY: Glucose-Capillary: 112 mg/dL — ABNORMAL HIGH (ref 70–99)

## 2019-11-12 LAB — LIPASE, BLOOD
Lipase: 27 U/L (ref 11–51)
Lipase: 25 U/L (ref 11–51)

## 2019-11-12 MED ORDER — AMLODIPINE BESYLATE 5 MG PO TABS: 5.0000 mg | ORAL_TABLET | Freq: Every day | ORAL | Status: DC

## 2019-11-12 MED ORDER — MORPHINE SULFATE (PF) 4 MG/ML IV SOLN
4.0000 mg | Freq: Once | INTRAVENOUS | Status: AC
  Administered 2019-11-12: 4 mg via INTRAVENOUS

## 2019-11-12 MED ORDER — ONDANSETRON HCL 4 MG/2ML IJ SOLN: 4.0000 mg | Freq: Once | INTRAMUSCULAR | Status: AC

## 2019-11-12 MED ORDER — TRAZODONE HCL 50 MG PO TABS: 25.0000 mg | ORAL_TABLET | Freq: Every evening | ORAL | Status: DC | PRN

## 2019-11-12 MED ORDER — ACETAMINOPHEN 325 MG PO TABS: 650.0000 mg | ORAL_TABLET | Freq: Four times a day (QID) | ORAL | Status: DC | PRN

## 2019-11-12 MED ORDER — ONDANSETRON HCL 4 MG PO TABS: 4.0000 mg | ORAL_TABLET | Freq: Four times a day (QID) | ORAL | Status: DC | PRN

## 2019-11-12 MED ORDER — INSULIN ASPART 100 UNIT/ML ~~LOC~~ SOLN
0.0000 [IU] | Freq: Four times a day (QID) | SUBCUTANEOUS | Status: DC
  Administered 2019-11-13: 1 [IU] via SUBCUTANEOUS
  Filled 2019-11-12: qty 1

## 2019-11-12 MED ORDER — ONDANSETRON HCL 4 MG/2ML IJ SOLN
INTRAMUSCULAR | Status: AC
  Administered 2019-11-12: 4 mg via INTRAVENOUS
  Filled 2019-11-12: qty 2

## 2019-11-12 MED ORDER — ONDANSETRON HCL 4 MG/2ML IJ SOLN
4.0000 mg | Freq: Four times a day (QID) | INTRAMUSCULAR | Status: DC | PRN
  Administered 2019-11-13: 4 mg via INTRAVENOUS
  Filled 2019-11-12: qty 2

## 2019-11-12 MED ORDER — ACETAMINOPHEN 650 MG RE SUPP: 650.0000 mg | Freq: Four times a day (QID) | RECTAL | Status: DC | PRN

## 2019-11-12 MED ORDER — MORPHINE SULFATE (PF) 4 MG/ML IV SOLN
4.0000 mg | Freq: Once | INTRAVENOUS | Status: AC
  Administered 2019-11-12: 4 mg via INTRAVENOUS
  Filled 2019-11-12: qty 1

## 2019-11-12 MED ORDER — FLUTICASONE PROPIONATE 50 MCG/ACT NA SUSP
2.0000 | Freq: Every day | NASAL | Status: DC
  Filled 2019-11-12: qty 16

## 2019-11-12 MED ORDER — MORPHINE SULFATE (PF) 2 MG/ML IV SOLN
INTRAVENOUS | Status: AC
Start: 2019-11-12 — End: 2019-11-13
  Administered 2019-11-13: 2 mg via INTRAVENOUS
  Filled 2019-11-12: qty 2

## 2019-11-12 MED ORDER — BENAZEPRIL HCL 10 MG PO TABS
10.0000 mg | ORAL_TABLET | Freq: Every day | ORAL | Status: DC
  Filled 2019-11-12 (×2): qty 1

## 2019-11-12 MED ORDER — SODIUM CHLORIDE 0.9 % IV SOLN: INTRAVENOUS | Status: DC

## 2019-11-12 MED ORDER — LIDOCAINE HCL (PF) 4 % IJ SOLN
4.0000 mL | Freq: Once | INTRAMUSCULAR | Status: AC
  Administered 2019-11-12: 4 mL
  Filled 2019-11-12: qty 5

## 2019-11-12 MED ORDER — AMLODIPINE BESY-BENAZEPRIL HCL 5-10 MG PO CAPS: 1.0000 | ORAL_CAPSULE | Freq: Every day | ORAL | Status: DC

## 2019-11-12 MED ORDER — LABETALOL HCL 5 MG/ML IV SOLN
20.0000 mg | INTRAVENOUS | Status: DC | PRN
  Administered 2019-11-13: 20 mg via INTRAVENOUS
  Filled 2019-11-12: qty 4

## 2019-11-12 MED ORDER — ENOXAPARIN SODIUM 40 MG/0.4ML ~~LOC~~ SOLN
40.0000 mg | SUBCUTANEOUS | Status: DC
  Administered 2019-11-12 – 2019-11-14 (×3): 40 mg via SUBCUTANEOUS
  Filled 2019-11-12 (×3): qty 0.4

## 2019-11-12 MED ORDER — SODIUM CHLORIDE 0.9 % IV BOLUS
1000.0000 mL | Freq: Once | INTRAVENOUS | Status: AC
  Administered 2019-11-12: 1000 mL via INTRAVENOUS

## 2019-11-12 MED ORDER — ONDANSETRON 8 MG PO TBDP
8.0000 mg | ORAL_TABLET | Freq: Once | ORAL | Status: AC
  Administered 2019-11-12: 8 mg via ORAL

## 2019-11-12 MED ORDER — ATORVASTATIN CALCIUM 20 MG PO TABS
20.0000 mg | ORAL_TABLET | Freq: Every day | ORAL | Status: DC
  Administered 2019-11-14: 20 mg via ORAL
  Filled 2019-11-12: qty 1

## 2019-11-12 MED ORDER — PANTOPRAZOLE SODIUM 40 MG IV SOLR
40.0000 mg | Freq: Two times a day (BID) | INTRAVENOUS | Status: DC
  Administered 2019-11-12 – 2019-11-15 (×6): 40 mg via INTRAVENOUS
  Filled 2019-11-12 (×6): qty 40

## 2019-11-12 NOTE — ED Provider Notes (Signed)
[facility] Emergency Department Provider Note  ____________________________________________  Time seen: Approximately 7:58 PM  I have reviewed the triage vital signs and the nursing notes.   HISTORY  Chief Complaint Abdominal Pain    HPI Jeremy [name] is a 59 y.o. male with a history of GERD hypertension and IBS D who was sent to the ED from urgent care due to small bowel obstruction.  Patient was in his usual state of health until 4 days ago when he noticed that he had stopped having bowel movements.  He had gradual onset worsening generalized abdominal pain since that time.  Decreased oral intake, vomiting whenever he tried to eat.  No bowel movements or passing gas currently.  Pain is nonradiating, no aggravating or alleviating factors.  No hematemesis.  Has a prior surgical history of appendectomy and inguinal hernia repair.  He has been vaccinated against Covid.  He had a positive Covid test on October 08, 2019 and had a mild URI illness at that time.      Past Medical History:  Diagnosis Date  . GERD (gastroesophageal reflux disease)   . Hypertension   . Pre-diabetes      Patient Active Problem List   Diagnosis Date Noted  . Encounter for screening colonoscopy   . Seasonal allergies 01/17/2017  . Personal history of tobacco use, presenting hazards to health 03/15/2016  . Former smoker 01/28/2016  . Overweight with body mass index (BMI) 25.0-29.9 11/29/2015  . Prediabetes 02/11/2015  . Contact dermatitis 11/10/2014  . Rosacea 07/13/2014  . Arm pain 07/13/2014  . 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 Essential (primary) hypertension 07/13/2014  . GERD (gastroesophageal reflux disease) 07/13/2014  . Hyperlipidemia 07/13/2014     Past Surgical History:  Procedure Laterality Date  . APPENDECTOMY    . COLONOSCOPY    . COLONOSCOPY WITH PROPOFOL N/A 11/07/2017   Procedure: COLONOSCOPY WITH PROPOFOL;  Surgeon:  Toney Reil, MD;  Location: [facility];  Service: Endoscopy;  Laterality: N/A;  . NASAL SEPTOPLASTY W/ TURBINOPLASTY  2009   Dr Jenne [facility] E[facility]     Prior to Admission medications   Medication Sig Start Date End Date Taking? Authorizing Provider  amLODipine-benazepril (LOTREL) 5-10 MG capsule TAKE 1 CAPSULE BY MOUTH EVERY DAY 02/01/19   Althea Charon, Netta Neat, DO  atorvastatin (LIPITOR) 20 MG tablet TAKE 1 TABLET BY MOUTH EVERY DAY 02/01/19   Karamalegos, Netta Neat, DO  cetirizine (ZYRTEC) 10 MG tablet Take 10 mg by mouth daily.    [provider]  clobetasol cream (TEMOVATE) 0.05 % Apply topically. 04/25/19 04/24/20  [provider]  fluticasone (FLONASE) 50 MCG/ACT nasal spray PLACE 2 SPRAYS INTO BOTH NOSTRILS DAILY. USE FOR 4-6 WEEKS THEN STOP AND USE SEASONALLY 03/13/19   Karamalegos, Netta Neat, DO  hydrocortisone (ANUSOL-HC) 25 MG suppository Place 1 suppository (25 mg total) rectally 2 (two) times daily. For 7 days 07/26/17   Smitty Cords, DO  metFORMIN (GLUCOPHAGE) 500 MG tablet TAKE 1 TABLET BY MOUTH EVERY DAY WITH BREAKFAST 07/26/19   Smitty Cords, DO  Multiple Vitamin (MULTIVITAMIN) tablet Take 1 tablet by mouth daily. Men's a day    [provider]  pantoprazole (PROTONIX) 40 MG tablet Take 1 tablet (40 mg total) by mouth daily as needed. 07/22/19   Karamalegos, Netta Neat, DO     Allergies Cephalexin   Family History  Problem Relation Age of Onset  . Cancer Mother  breast  . Clotting disorder Father   . Heart attack Father   . Heart attack Brother   . Pancreatic cancer Brother 1858       pancreatic    Social History Social History   Tobacco Use  . Smoking status: Former Smoker    Packs/day: 1.00    Years: 35.00    Pack years: 35.00    Types: Cigarettes    Quit date: 01/06/2014    Years since quitting: 5.8  . Smokeless tobacco: Former Clinical biochemistUser  Vaping Use  . Vaping Use: Never used  Substance Use  Topics  . Alcohol use: Yes    Alcohol/week: 6.0 standard drinks    Types: 6 Cans of beer per week  . Drug use: No    Review of Systems  Constitutional:   No fever or chills.  ENT:   No sore throat. No rhinorrhea. Cardiovascular:   No chest pain or syncope. Respiratory:   No dyspnea or cough. Gastrointestinal:   Positive as above for abdominal pain, vomiting, and obstipation Musculoskeletal:   Negative for focal pain or swelling All other systems reviewed and are negative except as documented above in ROS and HPI.  ____________________________________________   PHYSICAL EXAM:  VITAL SIGNS: ED Triage Vitals  Enc Vitals Group     BP 11/12/19 1736 (!) 177/105     Pulse Rate 11/12/19 1736 94     Resp 11/12/19 1736 16     Temp 11/12/19 1736 98.8 F (37.1 C)     Temp src --      SpO2 11/12/19 1736 98 %     Weight 11/12/19 1738 190 lb (86.2 kg)     Height 11/12/19 1738 6' (1.829 m)     Head Circumference --      Peak Flow --      Pain Score 11/12/19 1737 9     Pain Loc --      Pain Edu? --      Excl. in GC? --     Vital signs reviewed, nursing assessments reviewed.   Constitutional:   Alert and oriented. Non-toxic appearance. Eyes:   Conjunctivae are normal. EOMI. PERRL. ENT      Head:   Normocephalic and atraumatic.      Nose:   Wearing a mask.      Mouth/Throat:   Wearing a mask.      Neck:   No meningismus. Full ROM. Hematological/Lymphatic/Immunilogical:   No cervical lymphadenopathy. Cardiovascular:   RRR. Symmetric bilateral radial and DP pulses.  No murmurs. Cap refill less than 2 seconds. Respiratory:   Normal respiratory effort without tachypnea/retractions. Breath sounds are clear and equal bilaterally. No wheezes/rales/rhonchi. Gastrointestinal:   Soft with generalized tenderness and fullness.  Mildly distended. There is no CVA tenderness.  No rebound, rigidity, or guarding.  Musculoskeletal:   Normal range of motion in all extremities. No joint effusions.   No lower extremity tenderness.  No edema. Neurologic:   Normal speech and language.  Motor grossly intact. No acute focal neurologic deficits are appreciated.  Skin:    Skin is warm, dry and intact. No rash noted.  No petechiae, purpura, or bullae.  ____________________________________________    LABS (pertinent positives/negatives) (all labs ordered are listed, but only abnormal results are displayed) Labs Reviewed  COMPREHENSIVE METABOLIC PANEL - Abnormal; Notable for the following components:      Result Value   Glucose, Bld 114 (*)    Calcium 8.8 (*)    Total Bilirubin  1.6 (*)    All other components within normal limits  CBC - Abnormal; Notable for the following components:   WBC 10.6 (*)    All other components within normal limits  LIPASE, BLOOD  URINALYSIS, COMPLETE (UACMP) WITH MICROSCOPIC   ____________________________________________   EKG    ____________________________________________    RADIOLOGY  CT ABDOMEN PELVIS WO CONTRAST  Result Date: 11/12/2019 CLINICAL DATA:  Abdomen pain nausea vomiting EXAM: CT ABDOMEN AND PELVIS WITHOUT CONTRAST TECHNIQUE: Multidetector CT imaging of the abdomen and pelvis was performed following the standard protocol without IV contrast. COMPARISON:  CT 12/13/2018 FINDINGS: Lower chest: Lung bases demonstrate no acute consolidation or effusion. Cardiac size within normal limits. Mild coronary vascular calcification. Hepatobiliary: No focal liver abnormality is seen. No gallstones, gallbladder wall thickening, or biliary dilatation. Pancreas: Unremarkable. No pancreatic ductal dilatation or surrounding inflammatory changes. Spleen: Normal in size without focal abnormality. Adrenals/Urinary Tract: Adrenal glands are unremarkable. Kidneys are normal, without renal calculi, focal lesion, or hydronephrosis. Bladder is unremarkable. Stomach/Bowel: Mild fluid-filled stomach. Fluid-filled mildly dilated mid to distal small bowel with bowel  loops measuring up to 3 cm. Fecalized segment of distal small bowel in the right upper quadrant, series 2, image number 42 with transition to decompressed distal small bowel in the right mid abdominal region, coronal series 5, image number 38. Small bowel distal to this is decompressed. Patient is status post appendectomy. Diffuse diverticular disease of the colon. Redemonstrated small bowel diverticula at the duodenal jejunal junction. Vascular/Lymphatic: Moderate aortic atherosclerosis. No aneurysm. No suspicious nodes Reproductive: Prostate is unremarkable.  Prostate calcification. Other: No free air. Small amount of free fluid within the right upper quadrant and lower abdomen. 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 IMPRESSION: 1. Findings are suspicious for small bowel obstruction with suspected transition point in the right mid abdominal region, likely due to adhesions. 2. Small amount of free fluid within the right upper quadrant and lower abdomen. No free air. 3. Diffuse diverticular disease of the colon without acute inflammation. 4. 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 These results will be called to the ordering clinician or representative by the Radiologist Assistant, and communication documented in the PACS or [employer]. Aortic Atherosclerosis (ICD10-I70.0). Electronically Signed   By: Jasmine Pang M.D.   On: 11/12/2019 16:10    ____________________________________________   PROCEDURES Procedures  ____________________________________________    CLINICAL IMPRESSION / ASSESSMENT AND PLAN / ED COURSE  Medications ordered in the ED: Medications  morphine 2 MG/ML injection (has no administration in time range)  lidocaine (XYLOCAINE) 4 % (PF) injection 4 mL (has no administration in time range)  morphine 4 MG/ML injection 4 mg (has no administration in time range)  morphine 4 MG/ML injection 4 mg (4 mg Intravenous  Given 11/12/19 1755)  ondansetron (ZOFRAN) injection 4 mg (4 mg Intravenous Given 11/12/19 1755)    Pertinent labs & imaging results that were available during my care of the patient were reviewed by me and considered in my medical decision making (see chart for details).  Jeremy [name] was evaluated in Emergency Department on 11/12/2019 for the symptoms described in the history of present illness. He was evaluated in the context of the global COVID-19 pandemic, which necessitated consideration that the patient might be at risk for infection with the SARS-CoV-2 virus that causes COVID-19. Institutional protocols and algorithms that pertain to the evaluation of patients at risk for COVID-19 are in a  state of rapid change based on information released by regulatory bodies including the CDC and federal and state organizations. These policies and algorithms were followed during the patient's care in the ED.   Patient sent to ED with small bowel obstruction.  Vital signs unremarkable, nontoxic.  After initial 4 mg of IV morphine given in triage, pain is now 5/10.  I will give additional 4 mg of IV morphine, lidocaine nebulizer in anticipation of placing NG tube.  Case discussed with the hospitalist for further management.  By [facility] health testing criteria he does not need repeat Covid screening at this time.      ____________________________________________   FINAL CLINICAL IMPRESSION(S) / ED DIAGNOSES    Final diagnoses:  SBO (small bowel obstruction) (HCC)  Generalized abdominal pain   ED Discharge Orders    None      Portions of this note were generated with dragon dictation software. Dictation errors may occur despite best attempts at proofreading.   Sharman Cheek, MD 11/12/19 2000

## 2019-11-12 NOTE — ED Triage Notes (Signed)
First RN Note: pt presents to ED via [facility] with c/o possible bowel obstruction, per EMS no BM x 3 days. Per EMS pt had CT at [facility] and CT read possible bowel obstruction.    165/112 97Hr 20RR 98% RA 98.8Oral CBG 97

## 2019-11-12 NOTE — Discharge Instructions (Addendum)

## 2019-11-12 NOTE — ED Notes (Signed)
Report received from Molly RN. Patient care assumed. Patient/RN introduction complete. Will continue to monitor.  

## 2019-11-12 NOTE — ED Provider Notes (Signed)
[facility]    CSN: [id number] Arrival date & time: 11/12/19  1406      History   Chief Complaint Chief Complaint  Patient presents with  . Abdominal Pain    HPI Jeremy [name] is a 59 y.o. male presenting for 2-day history of umbilical and lower abdominal pain.  He rates the pain 9 out of 10.  He says the pain is constant, cramping and aching.  He admits to associated nausea and vomiting with trying to drink or eat anything.  He says that he had a small bowel movement yesterday which was normal.  He try to give himself an enema today because he felt he was "backed up."  Patient also says that he has noticed that his urine is a lot darker.  He denies any dysuria, urinary frequency, urgency or hematuria.  No testicular pain or swelling or urethral discharge.  Patient admits to some radiation of the left lower abdominal pain to the left lower back.  Patient believes he is dehydrated.  He denies any associated fevers, dark stools, bloody stools, diarrhea, or constipation.  Patient denies any chest pain, shortness of breath, palpitations.  No known Covid exposure.  He states that he has never had anything like this before and thinks he might have food poisoning.  He states that he ate some mushrooms that may have been expired about 5 hours before symptom onset.  Patient does have a history of prediabetes, hypertension, GERD, and diverticulitis.  Patient also has a history of emphysema and pulmonary nodules.  He says he has not taken anything for symptoms, because he cannot hold anything down.  Patient has had appendectomy.  He denies any other complaints or concerns today.  HPI  Past Medical History:  Diagnosis Date  . GERD (gastroesophageal reflux disease)   . Hypertension   . Pre-diabetes     Patient Active Problem List   Diagnosis Date Noted  . Encounter for screening colonoscopy   . Seasonal allergies 01/17/2017  . Personal history of tobacco use, presenting hazards to  health 03/15/2016  . Former smoker 01/28/2016  . Overweight with body mass index (BMI) 25.0-29.9 11/29/2015  . Prediabetes 02/11/2015  . Contact dermatitis 11/10/2014  . Rosacea 07/13/2014  . Arm pain 07/13/2014  . 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 Essential (primary) hypertension 07/13/2014  . GERD (gastroesophageal reflux disease) 07/13/2014  . Hyperlipidemia 07/13/2014    Past Surgical History:  Procedure Laterality Date  . APPENDECTOMY    . COLONOSCOPY    . COLONOSCOPY WITH PROPOFOL N/A 11/07/2017   Procedure: COLONOSCOPY WITH PROPOFOL;  Surgeon: Toney Reil, MD;  Location: [facility];  Service: Endoscopy;  Laterality: N/A;  . NASAL SEPTOPLASTY W/ TURBINOPLASTY  2009   Dr [facility]       Home Medications    Prior to Admission medications   Medication Sig Start Date End Date Taking? Authorizing Provider  amLODipine-benazepril (LOTREL) 5-10 MG capsule TAKE 1 CAPSULE BY MOUTH EVERY DAY 02/01/19  Yes Karamalegos, Netta Neat, DO  atorvastatin (LIPITOR) 20 MG tablet TAKE 1 TABLET BY MOUTH EVERY DAY 02/01/19  Yes Karamalegos, Alexander J, DO  fluticasone (FLONASE) 50 MCG/ACT nasal spray PLACE 2 SPRAYS INTO BOTH NOSTRILS DAILY. USE FOR 4-6 WEEKS THEN STOP AND USE SEASONALLY 03/13/19  Yes Karamalegos, Netta Neat, DO  metFORMIN (GLUCOPHAGE) 500 MG tablet TAKE 1 TABLET BY MOUTH EVERY DAY WITH BREAKFAST 07/26/19  Yes Smitty Cords, DO  Multiple  Vitamin (MULTIVITAMIN) tablet Take 1 tablet by mouth daily. Men's a day   Yes [provider]  pantoprazole (PROTONIX) 40 MG tablet Take 1 tablet (40 mg total) by mouth daily as needed. 07/22/19  Yes Karamalegos, Netta NeatAlexander J, DO  cetirizine (ZYRTEC) 10 MG tablet Take 10 mg by mouth daily.    [provider]  clobetasol cream (TEMOVATE) 0.05 % Apply topically. 04/25/19 04/24/20  [provider]  hydrocortisone (ANUSOL-HC) 25 MG suppository Place 1 suppository (25 mg total)  rectally 2 (two) times daily. For 7 days 07/26/17   Smitty CordsKaramalegos, Alexander J, DO    Family History Family History  Problem Relation Age of Onset  . Cancer Mother        breast  . Clotting disorder Father   . Heart attack Father   . Heart attack Brother   . Pancreatic cancer Brother [id number]       pancreatic    Social History Social History   Tobacco Use  . Smoking status: Former Smoker    Packs/day: 1.00    Years: 35.00    Pack years: 35.00    Types: Cigarettes    Quit date: 01/06/2014    Years since quitting: 5.8  . Smokeless tobacco: Former Clinical biochemistUser  Vaping Use  . Vaping Use: Never used  Substance Use Topics  . Alcohol use: Yes    Alcohol/week: 6.0 standard drinks    Types: 6 Cans of beer per week  . Drug use: No     Allergies   Cephalexin   Review of Systems Review of Systems  Constitutional: Positive for diaphoresis and fatigue. Negative for chills and fever.  HENT: Positive for sore throat. Negative for rhinorrhea.   Respiratory: Positive for cough. Negative for shortness of breath.   Cardiovascular: Negative for chest pain and palpitations.  Gastrointestinal: Positive for abdominal distention, abdominal pain, nausea and vomiting. Negative for anal bleeding, blood in stool, constipation, diarrhea and rectal pain.  Genitourinary: Positive for decreased urine volume. Negative for dysuria, frequency, hematuria, testicular pain and urgency.  Musculoskeletal: Positive for back pain. Negative for myalgias and neck stiffness.  Skin: Negative for rash.  Neurological: Positive for weakness. Negative for dizziness and headaches.     Physical Exam Triage Vital Signs ED Triage Vitals  Enc Vitals Group     BP 11/12/19 1421 (!) 165/112     Pulse Rate 11/12/19 1421 97     Resp 11/12/19 1421 20     Temp 11/12/19 1421 98.8 F (37.1 C)     Temp Source 11/12/19 1421 Oral     SpO2 11/12/19 1421 98 %     Weight --      Height --      Head Circumference --      Peak Flow --        Pain Score 11/12/19 1423 9     Pain Loc --      Pain Edu? --      Excl. in GC? --    No data found.  Updated Vital Signs BP (!) 165/112 (BP Location: Left Arm)   Pulse 97   Temp 98.8 F (37.1 C) (Oral)   Resp 20   SpO2 98%       Physical Exam Vitals and nursing note reviewed.  Constitutional:      General: He is not in acute distress.    Appearance: Normal appearance. He is well-developed. He is ill-appearing. He is not toxic-appearing or diaphoretic.  HENT:  Head: Normocephalic and atraumatic.     Mouth/Throat:     Pharynx: Uvula midline.     Tonsils: No tonsillar abscesses.  Eyes:     General: No scleral icterus.       Right eye: No discharge.        Left eye: No discharge.     Conjunctiva/sclera: Conjunctivae normal.  Neck:     Thyroid: No thyromegaly.     Trachea: No tracheal deviation.  Cardiovascular:     Rate and Rhythm: Normal rate and regular rhythm.     Heart sounds: Normal heart sounds.  Pulmonary:     Effort: Pulmonary effort is normal. No respiratory distress.     Breath sounds: Normal breath sounds. No wheezing or rales.  Abdominal:     General: There is distension.     Palpations: Abdomen is soft.     Tenderness: There is abdominal tenderness in the right upper quadrant, epigastric area, periumbilical area and left lower quadrant. There is guarding. There is no right CVA tenderness, left CVA tenderness or rebound. Negative signs include Murphy's sign, Rovsing's sign and McBurney's sign.     Comments: Bowel sounds increased right lower abdomen and absent left upper and left lower abdomen  Musculoskeletal:     Cervical back: Normal range of motion and neck supple.  Lymphadenopathy:     Cervical: No cervical adenopathy.  Skin:    General: Skin is warm and dry.     Findings: No rash.  Neurological:     General: No focal deficit present.     Mental Status: He is alert. Mental status is at baseline.     Motor: No weakness.     Gait: Gait  normal.  Psychiatric:        Mood and Affect: Mood normal.        Behavior: Behavior normal.        Thought Content: Thought content normal.      UC Treatments / Results  Labs (all labs ordered are listed, but only abnormal results are displayed) Labs Reviewed  CBC WITH DIFFERENTIAL/PLATELET - Abnormal; Notable for the following components:      Result Value   Hemoglobin 17.7 (*)    Abs Immature Granulocytes 0.11 (*)    All other components within normal limits  URINALYSIS, COMPLETE (UACMP) WITH MICROSCOPIC - Abnormal; Notable for the following components:   Bilirubin Urine MODERATE (*)    Ketones, ur 80 (*)    All other components within normal limits  COMPREHENSIVE METABOLIC PANEL - Abnormal; Notable for the following components:   Sodium 133 (*)    Glucose, Bld 122 (*)    Total Bilirubin 1.7 (*)    All other components within normal limits  LIPASE, BLOOD    EKG   Radiology CT ABDOMEN PELVIS WO CONTRAST  Result Date: 11/12/2019 CLINICAL DATA:  Abdomen pain nausea vomiting EXAM: CT ABDOMEN AND PELVIS WITHOUT CONTRAST TECHNIQUE: Multidetector CT imaging of the abdomen and pelvis was performed following the standard protocol without IV contrast. COMPARISON:  CT 12/13/2018 FINDINGS: Lower chest: Lung bases demonstrate no acute consolidation or effusion. Cardiac size within normal limits. Mild coronary vascular calcification. Hepatobiliary: No focal liver abnormality is seen. No gallstones, gallbladder wall thickening, or biliary dilatation. Pancreas: Unremarkable. No pancreatic ductal dilatation or surrounding inflammatory changes. Spleen: Normal in size without focal abnormality. Adrenals/Urinary Tract: Adrenal glands are unremarkable. Kidneys are normal, without renal calculi, focal lesion, or hydronephrosis. Bladder is unremarkable. Stomach/Bowel: Mild fluid-filled stomach. Fluid-filled  mildly dilated mid to distal small bowel with bowel loops measuring up to 3 cm. Fecalized  segment of distal small bowel in the right upper quadrant, series 2, image number 42 with transition to decompressed distal small bowel in the right mid abdominal region, coronal series 5, image number 38. Small bowel distal to this is decompressed. Patient is status post appendectomy. Diffuse diverticular disease of the colon. Redemonstrated small bowel diverticula at the duodenal jejunal junction. Vascular/Lymphatic: Moderate aortic atherosclerosis. No aneurysm. No suspicious nodes Reproductive: Prostate is unremarkable.  Prostate calcification. Other: No free air. Small amount of free fluid within the right upper quadrant and lower abdomen. 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 IMPRESSION: 1. Findings are suspicious for small bowel obstruction with suspected transition point in the right mid abdominal region, likely due to adhesions. 2. Small amount of free fluid within the right upper quadrant and lower abdomen. No free air. 3. Diffuse diverticular disease of the colon without acute inflammation. 4. 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 These results will be called to the ordering clinician or representative by the Radiologist Assistant, and communication documented in the PACS or [employer]. Aortic Atherosclerosis (ICD10-I70.0). Electronically Signed   By: Jasmine Pang M.D.   On: 11/12/2019 16:10    Procedures Procedures (including critical care time)  Medications Ordered in UC Medications  sodium chloride 0.9 % bolus 1,000 mL (1,000 mLs Intravenous New Bag/Given 11/12/19 1500)  ondansetron (ZOFRAN-ODT) disintegrating tablet 8 mg (8 mg Oral Given 11/12/19 1500)    Initial Impression / Assessment and Plan / UC Course  I have reviewed the triage vital signs and the nursing notes.  Pertinent labs & imaging results that were available during my care of the patient were reviewed by me and considered in my medical decision making  (see chart for details).   59 year old male presenting for 2-day history of severe lower abdominal pain.  This is associated with nausea and vomiting and increased pain when eating or drinking.  He has diffuse abdominal tenderness.  Increased bowel sounds of the right lower abdomen and decreased to absent throughout the rest the abdomen.  There is also guarding.  Vital signs are stable.  Blood pressure elevated at 165/112.  Patient afebrile.  Labs obtained today including CBC, CMP and lipase.  Sodium slightly low at 133.  White blood cell count normal.  Hemoglobin elevated at 17.7.  Blood glucose elevated 122.  Patient states he has not eaten.  Patient given 1 L IV fluids and 8 mg ODT Zofran.   DDx includes diverticulitis, constipation, cholecystitis, pancreatitis, gastroenteritis, food poisoning, bowel obstruction, and gastritis.   CT abdomen pelvis without contrast obtained today which is suspicious for small bowel obstruction.  Discussed results of imaging study with patient advised that he needs to go by EMS transport to [facility] ED for observation and possible surgical consult.  Patient given chance to ask questions.  Patient agreeable.   EMS called.  ED given report.  Pain rating in stable condition.   Final Clinical Impressions(s) / UC Diagnoses   Final diagnoses:  Small bowel obstruction (HCC)  Generalized abdominal pain  Non-intractable vomiting with nausea, unspecified vomiting type     Discharge Instructions     You have been advised to follow up immediately in the emergency department for concerning signs.symptoms. If you declined EMS transport, please have a family member take you directly to the ED at this time. Do  not delay. Based on concerns about condition, if you do not follow up in th e ED, you may risk poor outcomes including worsening of condition, delayed treatment and potentially life threatening issues. If you have declined to go to the ED at this time, you should call your  PCP immediately to set up a follow up appointment.  Go to ED for red flag symptoms, including; fevers you cannot reduce with Tylenol/Motrin, severe headaches, vision changes, numbness/weakness in part of the body, lethargy, confusion, intractable vomiting, severe dehydration, chest pain, breathing difficulty, severe persistent abdominal or pelvic pain, signs of severe infection (increased redness, swelling of an area), feeling faint or passing out, dizziness, etc. You should especially go to the ED for sudden acute worsening of condition if you do not elect to go at this time.     ED Prescriptions    None     PDMP not reviewed this encounter.   Shirlee Latch, PA-C 11/12/19 [phone]

## 2019-11-12 NOTE — ED Notes (Signed)
Pt tolerating NG tube

## 2019-11-12 NOTE — ED Triage Notes (Signed)
Pt presents with lower abdominal that woke him from sleep two days ago.  Minimal sleep the last two days.  Vomiting everything he tries to eat/drink. Had a small BM yesterday and unable to remember when he had a BM prior to that.  Tried enema this morning with no effect.  Urine has become a darker yellow.  No frequency, hematuria, dysuria.  Pain worse with sitting and laying on back.

## 2019-11-12 NOTE — ED Triage Notes (Signed)
Pt to the er for lower abd pain vomiting. Pt went to urgent care had a ct and dx with possible bowel obstruction. Pt not passing gas. Pt reports distension.

## 2019-11-12 NOTE — H&P (Addendum)
[place]   PATIENT NAME: Jeremy [name]    MR#:  [id number]  DATE OF BIRTH:  [birth year]  DATE OF ADMISSION:  11/12/2019  PRIMARY CARE PHYSICIAN: Smitty Cords, DO   REQUESTING/REFERRING PHYSICIAN: Alfonse Flavors, MD  CHIEF COMPLAINT:   Chief Complaint  Patient presents with  . Abdominal Pain    HISTORY OF PRESENT ILLNESS:  Jeremy [name]  is a 59 y.o. male with a known history of hypertension, GERD, pre-diabetes mellitus and COVID-19 in a breakthrough case about 6 weeks ago when he tested positive and later tested -3 weeks ago after having COVID-19 Moderna vaccines in March.  He presents to the emergency room with acute onset of intractable nausea and vomiting associated with lower abdominal pain over the last 3 days with no bowel movements.  He usually has loose bowel movements with IBS.  Denies fever or chills..  He has been having low-grade fever of 99 at forehead without chills.  He denies any dysuria, oliguria or hematuria or flank pain.  No dyspnea or cough or wheezing.  No chest pain or palpitations.  Upon presentation to the emergency room, blood pressure was 165/112 with otherwise normal vital signs.  Labs revealed mild hyponatremia earlier today a total bili 1.7 with otherwise unremarkable CMP.  CBC showed WBC of 10.2 and later 10.6 with hemoconcentration.  Urinalysis showed 80 ketones.  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  The patient was given 4 mg IV morphine sulfate twice as well as 4 mg of IV Zofran and was ordered an NG tube.  He will be admitted to a medical bed for further evaluation and  management. PAST MEDICAL HISTORY:   Past Medical History:  Diagnosis Date  . GERD (gastroesophageal reflux disease)   . Hypertension   . Pre-diabetes     PAST SURGICAL HISTORY:   Past Surgical History:  Procedure Laterality Date  . APPENDECTOMY    . COLONOSCOPY    . COLONOSCOPY WITH PROPOFOL N/A 11/07/2017   Procedure: COLONOSCOPY WITH PROPOFOL;  Surgeon: Toney Reil, MD;  Location: [facility];  Service: Endoscopy;  Laterality: N/A;  . NASAL SEPTOPLASTY W/ TURBINOPLASTY  2009   Dr Jenne [facility]    SOCIAL HISTORY:   Social History   Tobacco Use  . Smoking status: Former Smoker    Packs/day: 1.00    Years: 35.00    Pack years: 35.00    Types: Cigarettes    Quit date: 01/06/2014    Years since quitting: 5.8  . Smokeless tobacco: Former Engineer, water Use Topics  . Alcohol use: Yes    Alcohol/week: 6.0 standard drinks    Types: 6 Cans of beer per week    FAMILY HISTORY:   Family History  Problem Relation Age of Onset  . Cancer Mother        breast  . Clotting disorder Father   . Heart attack Father   . Heart attack Brother   . Pancreatic cancer Brother 65       pancreatic    DRUG ALLERGIES:   Allergies  Allergen Reactions  . Cephalexin Nausea And Vomiting  REVIEW OF SYSTEMS:   ROS As per history of present illness. All pertinent systems were reviewed above. Constitutional, HEENT, cardiovascular, respiratory, GI, GU, musculoskeletal, neuro, psychiatric, endocrine, integumentary and hematologic systems were reviewed and are otherwise negative/unremarkable except for positive findings mentioned above in the HPI.   MEDICATIONS AT HOME:   Prior to Admission medications   Medication Sig Start Date End Date Taking? Authorizing Provider  amLODipine-benazepril (LOTREL) 5-10 MG capsule TAKE 1 CAPSULE BY MOUTH EVERY DAY 02/01/19   Althea Charon, Netta Neat, DO  atorvastatin (LIPITOR) 20 MG tablet TAKE 1 TABLET BY MOUTH EVERY DAY 02/01/19    Karamalegos, Netta Neat, DO  cetirizine (ZYRTEC) 10 MG tablet Take 10 mg by mouth daily.    [provider]  clobetasol cream (TEMOVATE) 0.05 % Apply topically. 04/25/19 04/24/20  [provider]  fluticasone (FLONASE) 50 MCG/ACT nasal spray PLACE 2 SPRAYS INTO BOTH NOSTRILS DAILY. USE FOR 4-6 WEEKS THEN STOP AND USE SEASONALLY 03/13/19   Karamalegos, Netta Neat, DO  hydrocortisone (ANUSOL-HC) 25 MG suppository Place 1 suppository (25 mg total) rectally 2 (two) times daily. For 7 days 07/26/17   Smitty Cords, DO  metFORMIN (GLUCOPHAGE) 500 MG tablet TAKE 1 TABLET BY MOUTH EVERY DAY WITH BREAKFAST 07/26/19   Smitty Cords, DO  Multiple Vitamin (MULTIVITAMIN) tablet Take 1 tablet by mouth daily. Men's a day    [provider]  pantoprazole (PROTONIX) 40 MG tablet Take 1 tablet (40 mg total) by mouth daily as needed. 07/22/19   Karamalegos, Netta Neat, DO      VITAL SIGNS:  Blood pressure (!) 177/105, pulse 94, temperature 98.8 F (37.1 C), resp. rate 16, height 6' (1.829 m), weight 86.2 kg, SpO2 98 %.  PHYSICAL EXAMINATION:  Physical Exam  GENERAL:  59 y.o.-year-old Caucasian male patient lying in the bed with no acute distress.  EYES: Pupils equal, round, reactive to light and accommodation. No scleral icterus. Extraocular muscles intact.  HEENT: Head atraumatic, normocephalic. Oropharynx and nasopharynx clear.  NECK:  Supple, no jugular venous distention. No thyroid enlargement, no tenderness.  LUNGS: Normal breath sounds bilaterally, no wheezing, rales,rhonchi or crepitation. No use of accessory muscles of respiration.  CARDIOVASCULAR: Regular rate and rhythm, S1, S2 normal. No murmurs, rubs, or gallops.  ABDOMEN: Soft, mildly distended with lower abdominal as well as epigastric tenderness without rebound tenderness guarding or rigidity.. Bowel sounds were significantly diminished. No organomegaly or mass.  EXTREMITIES: No pedal edema, cyanosis,  or clubbing.  NEUROLOGIC: Cranial nerves II through XII are intact. Muscle strength 5/5 in all extremities. Sensation intact. Gait not checked.  PSYCHIATRIC: The patient is alert and oriented x 3.  Normal affect and good eye contact. SKIN: No obvious rash, lesion, or ulcer.   LABORATORY PANEL:   CBC Recent Labs  Lab 11/12/19 1742  WBC 10.6*  HGB 17.0  HCT 47.8  PLT 182   ------------------------------------------------------------------------------------------------------------------  Chemistries  Recent Labs  Lab 11/12/19 1742  NA 135  K 4.3  CL 103  CO2 23  GLUCOSE 114*  BUN 15  CREATININE 0.85  CALCIUM 8.8*  AST 15  ALT 21  ALKPHOS 68  BILITOT 1.6*   ------------------------------------------------------------------------------------------------------------------  Cardiac Enzymes No results for input(s): TROPONINI in the last 168 hours. ------------------------------------------------------------------------------------------------------------------  RADIOLOGY:  CT ABDOMEN PELVIS WO CONTRAST  Result Date: 11/12/2019 CLINICAL DATA:  Abdomen pain nausea vomiting EXAM: CT ABDOMEN AND PELVIS WITHOUT CONTRAST TECHNIQUE: Multidetector CT imaging of the abdomen and pelvis was performed following the standard  protocol without IV contrast. COMPARISON:  CT 12/13/2018 FINDINGS: Lower chest: Lung bases demonstrate no acute consolidation or effusion. Cardiac size within normal limits. Mild coronary vascular calcification. Hepatobiliary: No focal liver abnormality is seen. No gallstones, gallbladder wall thickening, or biliary dilatation. Pancreas: Unremarkable. No pancreatic ductal dilatation or surrounding inflammatory changes. Spleen: Normal in size without focal abnormality. Adrenals/Urinary Tract: Adrenal glands are unremarkable. Kidneys are normal, without renal calculi, focal lesion, or hydronephrosis. Bladder is unremarkable. Stomach/Bowel: Mild fluid-filled stomach.  Fluid-filled mildly dilated mid to distal small bowel with bowel loops measuring up to 3 cm. Fecalized segment of distal small bowel in the right upper quadrant, series 2, image number 42 with transition to decompressed distal small bowel in the right mid abdominal region, coronal series 5, image number 38. Small bowel distal to this is decompressed. Patient is status post appendectomy. Diffuse diverticular disease of the colon. Redemonstrated small bowel diverticula at the duodenal jejunal junction. Vascular/Lymphatic: Moderate aortic atherosclerosis. No aneurysm. No suspicious nodes Reproductive: Prostate is unremarkable.  Prostate calcification. Other: No free air. Small amount of free fluid within the right upper quadrant and lower abdomen. 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 IMPRESSION: 1. Findings are suspicious for small bowel obstruction with suspected transition point in the right mid abdominal region, likely due to adhesions. 2. Small amount of free fluid within the right upper quadrant and lower abdomen. No free air. 3. Diffuse diverticular disease of the colon without acute inflammation. 4. 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 These results will be called to the ordering clinician or representative by the Radiologist Assistant, and communication documented in the PACS or [employer]. Aortic Atherosclerosis (ICD10-I70.0). Electronically Signed   By: Jasmine Pang M.D.   On: 11/12/2019 16:10      IMPRESSION AND PLAN:   1.  Small bowel obstruction likely secondary to adhesions.  He has subsequent mild volume depletion and hyponatremia. -The patient will be admitted to a med-surg bed. -He will be kept n.p.o. -He will have an NG tube to low suction. -He will be hydrated with IV normal saline and will follow his BMP. -Pain management will be provided. -We will obtain a two-view abdomen x-ray in a.m. for follow-up. -General  surgery consultation will be obtained. -I notified Dr. Maia Plan about the patient.  2.  Essential hypertension. -We will continue his Lotrel and place him on as needed IV labetalol.  3.  Pre-type 2 diabetes mellitus. -He will be placed on supplement coverage with NovoLog and his Metformin will be held off.  4.  GERD. -He will be placed on IV PPI therapy.  5.  DVT prophylaxis. -Subcutaneous Lovenox.   All the records are reviewed and case discussed with ED provider. The plan of care was discussed in details with the patient (and family). I answered all questions. The patient agreed to proceed with the above mentioned plan. Further management will depend upon hospital course.   CODE STATUS: Full code  Status is: Inpatient  Remains inpatient appropriate because:Ongoing active pain requiring inpatient pain management, Ongoing diagnostic testing needed not appropriate for outpatient work up, Unsafe d/c plan, IV treatments appropriate due to intensity of illness or inability to take PO and Inpatient level of care appropriate due to severity of illness   Dispo: The patient is from: Home              Anticipated d/c is to: Home  Anticipated d/c date is: 2 days              Patient currently is not medically stable to d/c.   TOTAL TIME TAKING CARE OF THIS PATIENT: 55 minutes.    Hannah BeatJan A Nazli Penn M.D on 11/12/2019 at 8:14 PM  [facility]   From 7 PM-7 AM, contact night-coverage [email or web address]  CC: Primary care physician; Smitty CordsKaramalegos, Alexander J, DO

## 2019-11-12 NOTE — ED Triage Notes (Signed)
Patient is being discharged from the Urgent Care and sent to the Emergency Department via ambulance . Per Teresa Coombs PA, patient is in need of higher level of care due to bowel obstruction. Patient is aware and verbalizes understanding of plan of care.  Vitals:   11/12/19 1421  BP: (!) 165/112  Pulse: 97  Resp: 20  Temp: 98.8 F (37.1 C)  SpO2: 98%

## 2019-11-12 NOTE — ED Notes (Signed)
No pre certification needed for pt insurance. Ref # [id number]

## 2019-11-13 ENCOUNTER — Inpatient Hospital Stay: Payer: BC Managed Care – PPO

## 2019-11-13 ENCOUNTER — Ambulatory Visit: Payer: BC Managed Care – PPO | Admitting: Family Medicine

## 2019-11-13 LAB — BASIC METABOLIC PANEL
Anion gap: 8 (ref 5–15)
BUN: 15 mg/dL (ref 6–20)
CO2: 24 mmol/L (ref 22–32)
Calcium: 8.5 mg/dL — ABNORMAL LOW (ref 8.9–10.3)
Chloride: 106 mmol/L (ref 98–111)
Creatinine, Ser: 0.7 mg/dL (ref 0.61–1.24)
GFR calc non Af Amer: >60 mL/min (ref >60)
Glucose, Bld: 111 mg/dL — ABNORMAL HIGH (ref 70–99)
Potassium: 4.2 mmol/L (ref 3.5–5.1)
Sodium: 138 mmol/L (ref 135–145)

## 2019-11-13 LAB — HEMOGLOBIN A1C
Hgb A1c MFr Bld: 5.8 % — ABNORMAL HIGH (ref 4.8–5.6)
Mean Plasma Glucose: 119.76 mg/dL

## 2019-11-13 LAB — HIV ANTIBODY (ROUTINE TESTING W REFLEX): HIV Screen 4th Generation wRfx: NONREACTIVE

## 2019-11-13 LAB — CBC
HCT: 45.4 % (ref 39.0–52.0)
Hemoglobin: 16.1 g/dL (ref 13.0–17.0)
MCH: 32.5 pg (ref 26.0–34.0)
MCHC: 35.5 g/dL (ref 30.0–36.0)
MCV: 91.7 fL (ref 80.0–100.0)
Platelets: 155 10*3/uL (ref 150–400)
RBC: 4.95 MIL/uL (ref 4.22–5.81)
RDW: 13.2 % (ref 11.5–15.5)
WBC: 7.1 10*3/uL (ref 4.0–10.5)
nRBC: 0 % (ref 0.0–0.2)

## 2019-11-13 LAB — GLUCOSE, CAPILLARY: Glucose-Capillary: 123 mg/dL — ABNORMAL HIGH (ref 70–99)

## 2019-11-13 MED ORDER — MORPHINE SULFATE (PF) 2 MG/ML IV SOLN
2.0000 mg | INTRAVENOUS | Status: DC | PRN
  Administered 2019-11-13 (×4): 2 mg via INTRAVENOUS
  Filled 2019-11-13 (×4): qty 1

## 2019-11-13 MED ORDER — MORPHINE SULFATE (PF) 4 MG/ML IV SOLN
INTRAVENOUS | Status: AC
Start: 2019-11-13 — End: 2019-11-14
  Filled 2019-11-13: qty 1

## 2019-11-13 MED ORDER — INFLUENZA VAC SPLIT QUAD 0.5 ML IM SUSY
0.5000 mL | PREFILLED_SYRINGE | INTRAMUSCULAR | Status: DC
  Filled 2019-11-13: qty 0.5

## 2019-11-13 MED ORDER — DIATRIZOATE MEGLUMINE & SODIUM 66-10 % PO SOLN
90.0000 mL | Freq: Once | ORAL | Status: AC
  Administered 2019-11-13: 90 mL via NASOGASTRIC

## 2019-11-13 NOTE — ED Notes (Signed)
Pt resting comfortably, awaiting hospital bed availability.

## 2019-11-13 NOTE — ED Notes (Signed)
Pt back from xray and placed back on monitoring equipment

## 2019-11-13 NOTE — Progress Notes (Signed)
PROGRESS NOTE    Jeremy [name]  NWG:[id number] DOB: 1960/10/25 DOA: 11/12/2019 PCP: Smitty Cords, DO   Chief Complain: Abdominal pain  Brief Narrative: Patient is a 59 year old male with history of hypertension, GERD, prediabetes, Covid infection about 6 weeks ago( fully vaccinated) who presents to the emergency department complaints of intractable nausea, vomiting with lower abdominal pain for 3 days with no bowel movement.  On presentation he was hypertensive.  Abdomen/pelvic CT scan showed small bowel obstruction with transition point in the right midabdomen region likely due to adhesions.  General surgery consulted and following.  NG tube placed.  Started on conservative management.   Assessment & Plan:   Active Problems:   SBO (small bowel obstruction) (HCC)   Small bowel obstruction : Likely secondary adhesions.  CT abdomen/pelvis showed small bowel ulcers with transition point in the right mid abdomen.  He had history of appendicectomy and bilateral inguinal hernia repair General surgery consulted and following.  Started on conservative management.  On NG tube.  Continue IV fluids, antiemetics, pain medications.  Hypertension: Continue current medications.  Monitor blood pressure  Pre diabetes type 2: Continue current insulin regimen.  On Metformin at home.  Hemoglobin A1c of 5.8  GERD: Continue PPI  Covid infection: He had Covid infection about 6 weeks ago.  Currently out of window for isolation.  He has been fully vaccinated with Moderna          DVT prophylaxis:Lovenox Code Status: Full Family Communication:  Status is: Inpatient  Remains inpatient appropriate because:Inpatient level of care appropriate due to severity of illness   Dispo: The patient is from: Home              Anticipated d/c is to: Home              Anticipated d/c date is: 3 days              Patient currently is not medically stable to d/c.     Consultants:  Surgery  Procedures:None  Antimicrobials:  Anti-infectives (From admission, onward)   None      Subjective:  Patient seen and examined the bedside this morning.  Hemodynamically stable.  His abdomen pain is better.  No nausea or vomiting.  NG tube draining some fluid.  Not passing gas yet.  Objective: Vitals:   11/13/19 0100 11/13/19 0215 11/13/19 0400 11/13/19 0600  BP: (!) 160/91 (!) 91/47 (!) 144/84 (!) 165/89  Pulse: 75 76 79 73  Resp: 13 13 17 15   Temp:      SpO2: 94% 93% 95% 94%  Weight:      Height:       No intake or output data in the 24 hours ending 11/13/19 0831 Filed Weights   11/12/19 1738  Weight: 86.2 kg    Examination:  General exam: Appears calm and comfortable ,Not in distress,average built HEENT:PERRL,Oral mucosa moist, Ear/Nose normal on gross exam Respiratory system: Bilateral equal air entry, normal vesicular breath sounds, no wheezes or crackles  Cardiovascular system: S1 & S2 heard, RRR. No JVD, murmurs, rubs, gallops or clicks. No pedal edema. Gastrointestinal system: Abdomen is mildly distended, soft and nontender. No organomegaly or masses felt. Normal bowel sounds heard. Central nervous system: Alert and oriented. No focal neurological deficits. Extremities: No edema, no clubbing ,no cyanosis, distal peripheral pulses palpable. Skin: No rashes, lesions or ulcers,no icterus ,no pallor   Data Reviewed: I have personally reviewed following labs and imaging studies  CBC:  Recent Labs  Lab 11/12/19 1459 11/12/19 1742 11/13/19 0500  WBC 10.2 10.6* 7.1  NEUTROABS 7.3  --   --   HGB 17.7* 17.0 16.1  HCT 51.1 47.8 45.4  MCV 92.2 90.7 91.7  PLT 205 182 155   Basic Metabolic Panel: Recent Labs  Lab 11/12/19 1459 11/12/19 1742 11/13/19 0500  NA 133* 135 138  K 4.2 4.3 4.2  CL 102 103 106  CO2 22 23 24   GLUCOSE 122* 114* 111*  BUN 17 15 15   CREATININE 0.79 0.85 0.70  CALCIUM 9.2 8.8* 8.5*   GFR: Estimated Creatinine Clearance:  109.1 mL/min (by C-G formula based on SCr of 0.7 mg/dL). Liver Function Tests: Recent Labs  Lab 11/12/19 1459 11/12/19 1742  AST 18 15  ALT 22 21  ALKPHOS 74 68  BILITOT 1.7* 1.6*  PROT 7.8 7.3  ALBUMIN 4.4 4.3   Recent Labs  Lab 11/12/19 1459 11/12/19 1742  LIPASE 27 25   No results for input(s): AMMONIA in the last 168 hours. Coagulation Profile: No results for input(s): INR, PROTIME in the last 168 hours. Cardiac Enzymes: No results for input(s): CKTOTAL, CKMB, CKMBINDEX, TROPONINI in the last 168 hours. BNP (last 3 results) No results for input(s): PROBNP in the last 8760 hours. HbA1C: Recent Labs    11/12/19 1459  HGBA1C 5.8*   CBG: Recent Labs  Lab 11/12/19 2350  GLUCAP 112*   Lipid Profile: No results for input(s): CHOL, HDL, LDLCALC, TRIG, CHOLHDL, LDLDIRECT in the last 72 hours. Thyroid Function Tests: No results for input(s): TSH, T4TOTAL, FREET4, T3FREE, THYROIDAB in the last 72 hours. Anemia Panel: No results for input(s): VITAMINB12, FOLATE, FERRITIN, TIBC, IRON, RETICCTPCT in the last 72 hours. Sepsis Labs: No results for input(s): PROCALCITON, LATICACIDVEN in the last 168 hours.  No results found for this or any previous visit (from the past 240 hour(s)).       Radiology Studies: CT ABDOMEN PELVIS WO CONTRAST  Result Date: 11/12/2019 CLINICAL DATA:  Abdomen pain nausea vomiting EXAM: CT ABDOMEN AND PELVIS WITHOUT CONTRAST TECHNIQUE: Multidetector CT imaging of the abdomen and pelvis was performed following the standard protocol without IV contrast. COMPARISON:  CT 12/13/2018 FINDINGS: Lower chest: Lung bases demonstrate no acute consolidation or effusion. Cardiac size within normal limits. Mild coronary vascular calcification. Hepatobiliary: No focal liver abnormality is seen. No gallstones, gallbladder wall thickening, or biliary dilatation. Pancreas: Unremarkable. No pancreatic ductal dilatation or surrounding inflammatory changes. Spleen:  Normal in size without focal abnormality. Adrenals/Urinary Tract: Adrenal glands are unremarkable. Kidneys are normal, without renal calculi, focal lesion, or hydronephrosis. Bladder is unremarkable. Stomach/Bowel: Mild fluid-filled stomach. Fluid-filled mildly dilated mid to distal small bowel with bowel loops measuring up to 3 cm. Fecalized segment of distal small bowel in the right upper quadrant, series 2, image number 42 with transition to decompressed distal small bowel in the right mid abdominal region, coronal series 5, image number 38. Small bowel distal to this is decompressed. Patient is status post appendectomy. Diffuse diverticular disease of the colon. Redemonstrated small bowel diverticula at the duodenal jejunal junction. Vascular/Lymphatic: Moderate aortic atherosclerosis. No aneurysm. No suspicious nodes Reproductive: Prostate is unremarkable.  Prostate calcification. Other: No free air. Small amount of free fluid within the right upper quadrant and lower abdomen. 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 IMPRESSION: 1. Findings are suspicious for small bowel obstruction with suspected transition point in the right mid abdominal region, likely due to adhesions. 2.  Small amount of free fluid within the right upper quadrant and lower abdomen. No free air. 3. Diffuse diverticular disease of the colon without acute inflammation. 4. 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 These results will be called to the ordering clinician or representative by the Radiologist Assistant, and communication documented in the PACS or [employer]. Aortic Atherosclerosis (ICD10-I70.0). Electronically Signed   By: Jasmine Pang M.D.   On: 11/12/2019 16:10   DG Abdomen 1 View  Result Date: 11/12/2019 CLINICAL DATA:  Post NG tube placement EXAM: ABDOMEN - 1 VIEW COMPARISON:  None. FINDINGS: The bowel gas pattern is normal. Tip the NG tube is seen within the  proximal stomach. No radio-opaque calculi or other significant radiographic abnormality are seen. IMPRESSION: Tip of the NG tube in the proximal stomach. Electronically Signed   By: Jonna Clark M.D.   On: 11/12/2019 21:16   DG Abd 2 Views  Result Date: 11/13/2019 CLINICAL DATA:  Small-bowel obstruction. Abdominal pain and distention. NG tube. EXAM: ABDOMEN - 2 VIEW COMPARISON:  11/12/2019.  CT 11/12/2019. FINDINGS: NG tube noted in stable position with tip in the stomach. Small-bowel distention with scattered air-fluid levels again noted consistent with previously identified probable small bowel obstruction. No free air identified. Stool in the colon. Stable calcific density noted over the left abdomen. Thoracolumbar degenerative change and scoliosis. Degenerative changes both hips. IMPRESSION: 1. NG tube in stable position with tip in the stomach. 2. Small-bowel distention with scattered air-fluid levels again noted consistent with previously identified probable small bowel obstruction. Electronically Signed   By: Maisie Fus  Register   On: 11/13/2019 07:16        Scheduled Meds: . atorvastatin  20 mg Oral Daily  . enoxaparin (LOVENOX) injection  40 mg Subcutaneous Q24H  . fluticasone  2 spray Each Nare Daily  . insulin aspart  0-9 Units Subcutaneous Q6H  . pantoprazole (PROTONIX) IV  40 mg Intravenous Q12H   Continuous Infusions: . sodium chloride 100 mL/hr at 11/13/19 0713     LOS: 1 day    Time spent: 35 mins.,More than 50% of that time was spent in counseling and/or coordination of care.      Burnadette Pop, MD [facility] P10/08/2019, 8:31 AM

## 2019-11-13 NOTE — ED Notes (Signed)
No change in condition, will continue to monitor.  

## 2019-11-13 NOTE — Consult Note (Signed)
SURGICAL CONSULTATION NOTE   HISTORY OF PRESENT ILLNESS (HPI):  59 y.o. Jeremy [name] presented to [facility] ED for evaluation of abdominal pain nausea and vomiting. Patient reports started having abdominal pain since 4 days ago.  The pain has been consistent.  He also denies any gas or bowel movement in the last 4 days.  The pain does not radiate to the probably body.  There has been no alleviating or rating factors.  At the ED he was found with 10,000 white blood cell count.  Hemoglobin is 17.  This was repeated this morning and no other signal normalized with hemoglobin dropping to 16.  This most likely due to dehydration.  CT scan of the abdomen and pelvis shows dilated small bowel loops.  No free air or free fluid.  Abdominal x-ray this morning shows persistent small bowel dilation.  I personally evaluated the images of the CT scan and the abdominal x-ray  Surgery is consulted by Dr. Arville Care in this context for evaluation and management of small bowel obstruction.  PAST MEDICAL HISTORY (PMH):  Past Medical History:  Diagnosis Date  . GERD (gastroesophageal reflux disease)   . Hypertension   . Pre-diabetes      PAST SURGICAL HISTORY (PSH):  Past Surgical History:  Procedure Laterality Date  . APPENDECTOMY    . COLONOSCOPY    . COLONOSCOPY WITH PROPOFOL N/A 11/07/2017   Procedure: COLONOSCOPY WITH PROPOFOL;  Surgeon: Toney Reil, MD;  Location: [facility];  Service: Endoscopy;  Laterality: N/A;  . NASAL SEPTOPLASTY W/ TURBINOPLASTY  2009   Dr Jenne [facility] ENT     MEDICATIONS:  Prior to Admission medications   Medication Sig Start Date End Date Taking? Authorizing Provider  amLODipine-benazepril (LOTREL) 5-10 MG capsule TAKE 1 CAPSULE BY MOUTH EVERY DAY 02/01/19  Yes Karamalegos, Netta Neat, DO  atorvastatin (LIPITOR) 20 MG tablet TAKE 1 TABLET BY MOUTH EVERY DAY Patient taking differently: Take 20 mg by mouth daily.  02/01/19  Yes Karamalegos, Netta Neat, DO  cetirizine (ZYRTEC)  10 MG tablet Take 10 mg by mouth daily.   Yes [provider]  fluticasone (FLONASE) 50 MCG/ACT nasal spray PLACE 2 SPRAYS INTO BOTH NOSTRILS DAILY. USE FOR 4-6 WEEKS THEN STOP AND USE SEASONALLY Patient taking differently: Place 2 sprays into both nostrils daily.  03/13/19  Yes Karamalegos, Netta Neat, DO  metFORMIN (GLUCOPHAGE) 500 MG tablet TAKE 1 TABLET BY MOUTH EVERY DAY WITH BREAKFAST Patient taking differently: Take 500 mg by mouth daily with breakfast.  07/26/19  Yes Karamalegos, Netta Neat, DO  Multiple Vitamin (MULTIVITAMIN) tablet Take 1 tablet by mouth daily. Men's a day   Yes [provider]  pantoprazole (PROTONIX) Jeremy MG tablet Take 1 tablet (Jeremy mg total) by mouth daily as needed. 07/22/19  Yes Karamalegos, Netta Neat, DO  clobetasol cream (TEMOVATE) 0.05 % Apply topically. 04/25/19 04/24/20  [provider]  hydrocortisone (ANUSOL-HC) 25 MG suppository Place 1 suppository (25 mg total) rectally 2 (two) times daily. For 7 days Patient not taking: Reported on 11/12/2019 07/26/17   Smitty Cords, DO     ALLERGIES:  Allergies  Allergen Reactions  . Cephalexin Nausea And Vomiting     SOCIAL HISTORY:  Social History   Socioeconomic History  . Marital status: Married    Spouse name: Not on file  . Number of children: Not on file  . Years of education: Not on file  . Highest education level: Not on file  Occupational History  .  Not on file  Tobacco Use  . Smoking status: Former Smoker    Packs/day: 1.00    Years: 35.00    Pack years: 35.00    Types: Cigarettes    Quit date: 01/06/2014    Years since quitting: 5.8  . Smokeless tobacco: Former Clinical biochemistUser  Vaping Use  . Vaping Use: Never used  Substance and Sexual Activity  . Alcohol use: Yes    Alcohol/week: 6.0 standard drinks    Types: 6 Cans of beer per week  . Drug use: No  . Sexual activity: Not on file  Other Topics Concern  . Not on file  Social History Narrative  . Not on file    Social Determinants of Health   Financial Resource Strain:   . Difficulty of Paying Living Expenses: Not on file  Food Insecurity:   . Worried About Programme researcher, broadcasting/film/videounning Out of Food in the Last Year: Not on file  . Ran Out of Food in the Last Year: Not on file  Transportation Needs:   . Lack of Transportation (Medical): Not on file  . Lack of Transportation (Non-Medical): Not on file  Physical Activity:   . Days of Exercise per Week: Not on file  . Minutes of Exercise per Session: Not on file  Stress:   . Feeling of Stress : Not on file  Social Connections:   . Frequency of Communication with Friends and Family: Not on file  . Frequency of Social Gatherings with Friends and Family: Not on file  . Attends Religious Services: Not on file  . Active Member of Clubs or Organizations: Not on file  . Attends BankerClub or Organization Meetings: Not on file  . Marital Status: Not on file  Intimate Partner Violence:   . Fear of Current or Ex-Partner: Not on file  . Emotionally Abused: Not on file  . Physically Abused: Not on file  . Sexually Abused: Not on file      FAMILY HISTORY:  Family History  Problem Relation Age of Onset  . Cancer Mother        breast  . Clotting disorder Father   . Heart attack Father   . Heart attack Brother   . Pancreatic cancer Brother [id number]       pancreatic     REVIEW OF SYSTEMS:  Constitutional: denies weight loss, fever, chills, or sweats  Eyes: denies any other vision changes, history of eye injury  ENT: denies sore throat, hearing problems  Respiratory: denies shortness of breath, wheezing  Cardiovascular: denies chest pain, palpitations  Gastrointestinal: Positive abdominal pain, nausea and vomiting Genitourinary: denies burning with urination or urinary frequency Musculoskeletal: denies any other joint pains or cramps  Skin: denies any other rashes or skin discolorations  Neurological: denies any other headache, dizziness, weakness  Psychiatric: denies any  other depression, anxiety   All other review of systems were negative   VITAL SIGNS:  Temp:  [98.8 F (37.1 C)] 98.8 F (37.1 C) (10/06 1736) Pulse Rate:  [73-97] 73 (10/07 0600) Resp:  [13-24] 15 (10/07 0600) BP: (91-177)/(47-112) 165/89 (10/07 0600) SpO2:  [93 %-98 %] 94 % (10/07 0600) Weight:  [86.2 kg] 86.2 kg (10/06 1738)     Height: 6' (182.9 cm) Weight: 86.2 kg BMI (Calculated): 25.76   INTAKE/OUTPUT:  This shift: No intake/output data recorded.  Last 2 shifts: @IOLAST2SHIFTS @   PHYSICAL EXAM:  Constitutional:  -- Normal body habitus  -- Awake, alert, and oriented x3  Eyes:  -- Pupils  equally round and reactive to light  -- No scleral icterus  Ear, nose, and throat:  -- No jugular venous distension  Pulmonary:  -- No crackles  -- Equal breath sounds bilaterally -- Breathing non-labored at rest Cardiovascular:  -- S1, S2 present  -- No pericardial rubs Gastrointestinal:  -- Abdomen soft, mild tender in right lower quadrant, distended, no guarding or rebound tenderness -- No abdominal masses appreciated, pulsatile or otherwise  Musculoskeletal and Integumentary:  -- Wounds: None appreciated -- Extremities: B/L UE and LE FROM, hands and feet warm, no edema  Neurologic:  -- Motor function: intact and symmetric -- Sensation: intact and symmetric   Labs:  CBC Latest Ref Rng & Units 11/13/2019 11/12/2019 11/12/2019  WBC 4.0 - 10.5 K/uL 7.1 10.6(H) 10.2  Hemoglobin 13.0 - 17.0 g/dL 16.1 09.6 17.7(H)  Hematocrit 39 - 52 % 45.4 47.8 51.1  Platelets 150 - 400 K/uL 155 182 205   CMP Latest Ref Rng & Units 11/13/2019 11/12/2019 11/12/2019  Glucose 70 - 99 mg/dL 045(W) 098(J) 191(Y)  BUN 6 - 20 mg/dL 15 15 17   Creatinine 0.61 - 1.24 mg/dL 7.82 9.56  Sodium 135 - 145 mmol/L 138 135 133(L)  Potassium 3.5 - 5.1 mmol/L 4.2 4.3 4.2  Chloride 98 - 111 mmol/L 106 103 102  CO2 22 - 32 mmol/L 24 23 22   Calcium 8.9 - 10.3 mg/dL 2.13) ) 9.2  Total Protein 6.5 - 8.1  g/dL - 7.3 7.8  Total Bilirubin 0.3 - 1.2 mg/dL - 1.6(H) 1.7(H)  Alkaline Phos 38 - 126 U/L - 68 74  AST 15 - 41 U/L - 15 18  ALT 0 - 44 U/L - 21 22    Imaging studies:  EXAM: CT ABDOMEN AND PELVIS WITHOUT CONTRAST  TECHNIQUE: Multidetector CT imaging of the abdomen and pelvis was performed following the standard protocol without IV contrast.  COMPARISON:  CT 12/13/2018  FINDINGS: Lower chest: Lung bases demonstrate no acute consolidation or effusion. Cardiac size within normal limits. Mild coronary vascular calcification.  Hepatobiliary: No focal liver abnormality is seen. No gallstones, gallbladder wall thickening, or biliary dilatation.  Pancreas: Unremarkable. No pancreatic ductal dilatation or surrounding inflammatory changes.  Spleen: Normal in size without focal abnormality.  Adrenals/Urinary Tract: Adrenal glands are unremarkable. Kidneys are normal, without renal calculi, focal lesion, or hydronephrosis. Bladder is unremarkable.  Stomach/Bowel: Mild fluid-filled stomach. Fluid-filled mildly dilated mid to distal small bowel with bowel loops measuring up to 3 cm. Fecalized segment of distal small bowel in the right upper quadrant, series 2, image number 42 with transition to decompressed distal small bowel in the right mid abdominal region, coronal series 5, image number 38. Small bowel distal to this is decompressed. Patient is status post appendectomy. Diffuse diverticular disease of the colon. Redemonstrated small bowel diverticula at the duodenal jejunal junction.  Vascular/Lymphatic: Moderate aortic atherosclerosis. No aneurysm. No suspicious nodes  Reproductive: Prostate is unremarkable.  Prostate calcification.  Other: No free air. Small amount of free fluid within the right upper quadrant and lower abdomen.  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  IMPRESSION: 1. Findings are suspicious for  small bowel obstruction with suspected transition point in the right mid abdominal region, likely due to adhesions. 2. Small amount of free fluid within the right upper quadrant and lower abdomen. No free air. 3. Diffuse diverticular disease of the colon without acute inflammation. 4. 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  These results will be called to the ordering clinician or representative by the Radiologist Assistant, and communication documented in the PACS or [employer].  Aortic Atherosclerosis (ICD10-I70.0).   Electronically Signed   By: Jasmine Pang M.D.   On: 11/12/2019 16:10   Assessment/Plan:  59 y.o. Jeremy [name] with small bowel obstruction, complicated by pertinent comorbidities including hypertension, diabetes mellitius.  Patient with history, physical exam and imaging consistent with small bowel obstruction.  At this moment the physical exam shows mild tenderness in the right lower quadrant.  No guarding.  There is no acute abdomen or any indication for urgent surgical management at this moment.  I will order Gastrografin challenge for further evaluation of the small bowel obstruction.  Patient was oriented about the plan that if the bowel obstruction does not resolve within 48 to 72 hours he will need surgical management.  I encouraged the patient to ambulate.  No contraindication for DVT prophylaxis.  Agree with NGT in place and IV fluid hydration.   Gae Gallop, MD

## 2019-11-13 NOTE — ED Notes (Signed)
Pt resting comfortably with eyes closed

## 2019-11-13 NOTE — ED Notes (Signed)
Radiology at bedside for small bowel study

## 2019-11-13 NOTE — ED Notes (Signed)
Provider at bedside

## 2019-11-13 NOTE — ED Notes (Signed)
Pt to xray

## 2019-11-14 ENCOUNTER — Inpatient Hospital Stay: Payer: BC Managed Care – PPO

## 2019-11-14 LAB — BASIC METABOLIC PANEL
Anion gap: 8 (ref 5–15)
BUN: 15 mg/dL (ref 6–20)
CO2: 27 mmol/L (ref 22–32)
Calcium: 8.7 mg/dL — ABNORMAL LOW (ref 8.9–10.3)
Chloride: 105 mmol/L (ref 98–111)
Creatinine, Ser: 0.78 mg/dL (ref 0.61–1.24)
GFR calc non Af Amer: >60 mL/min (ref >60)
Glucose, Bld: 100 mg/dL — ABNORMAL HIGH (ref 70–99)
Potassium: 3.7 mmol/L (ref 3.5–5.1)
Sodium: 140 mmol/L (ref 135–145)

## 2019-11-14 LAB — GLUCOSE, CAPILLARY
Glucose-Capillary: 128 mg/dL — ABNORMAL HIGH (ref 70–99)
Glucose-Capillary: 90 mg/dL (ref 70–99)
Glucose-Capillary: 108 mg/dL — ABNORMAL HIGH (ref 70–99)
Glucose-Capillary: 126 mg/dL — ABNORMAL HIGH (ref 70–99)
Glucose-Capillary: 98 mg/dL (ref 70–99)
Glucose-Capillary: 99 mg/dL (ref 70–99)

## 2019-11-14 MED ORDER — AMLODIPINE BESYLATE 5 MG PO TABS
5.0000 mg | ORAL_TABLET | Freq: Every day | ORAL | Status: DC
  Administered 2019-11-14 – 2019-11-15 (×2): 5 mg via ORAL
  Filled 2019-11-14 (×2): qty 1

## 2019-11-14 MED ORDER — ACETAMINOPHEN 650 MG RE SUPP: 650.0000 mg | Freq: Four times a day (QID) | RECTAL | Status: DC | PRN

## 2019-11-14 MED ORDER — ZOLPIDEM TARTRATE 5 MG PO TABS
5.0000 mg | ORAL_TABLET | Freq: Every evening | ORAL | Status: DC | PRN
  Administered 2019-11-14: 5 mg via ORAL
  Filled 2019-11-14: qty 1

## 2019-11-14 MED ORDER — AMLODIPINE BESY-BENAZEPRIL HCL 5-10 MG PO CAPS: 1.0000 | ORAL_CAPSULE | Freq: Every day | ORAL | Status: DC

## 2019-11-14 MED ORDER — ONDANSETRON HCL 4 MG PO TABS: 4.0000 mg | ORAL_TABLET | Freq: Four times a day (QID) | ORAL | Status: DC | PRN

## 2019-11-14 MED ORDER — ACETAMINOPHEN 325 MG PO TABS
650.0000 mg | ORAL_TABLET | Freq: Four times a day (QID) | ORAL | Status: DC | PRN
  Administered 2019-11-14: 650 mg
  Filled 2019-11-14: qty 2

## 2019-11-14 MED ORDER — TRAZODONE HCL 50 MG PO TABS: 25.0000 mg | ORAL_TABLET | Freq: Every evening | ORAL | Status: DC | PRN

## 2019-11-14 MED ORDER — TRAZODONE HCL 50 MG PO TABS: 50.0000 mg | ORAL_TABLET | Freq: Every evening | ORAL | Status: DC | PRN

## 2019-11-14 MED ORDER — ATORVASTATIN CALCIUM 20 MG PO TABS
20.0000 mg | ORAL_TABLET | Freq: Every day | ORAL | Status: DC
  Administered 2019-11-15: 20 mg
  Filled 2019-11-14: qty 1

## 2019-11-14 MED ORDER — PHENYLEPHRINE-MINERAL OIL-PET 0.25-14-74.9 % RE OINT
1.0000 "application " | TOPICAL_OINTMENT | Freq: Two times a day (BID) | RECTAL | Status: DC | PRN
  Administered 2019-11-14: 1 via RECTAL
  Filled 2019-11-14: qty 57

## 2019-11-14 MED ORDER — ONDANSETRON HCL 4 MG/2ML IJ SOLN: 4.0000 mg | Freq: Four times a day (QID) | INTRAMUSCULAR | Status: DC | PRN

## 2019-11-14 MED ORDER — BENAZEPRIL HCL 10 MG PO TABS
10.0000 mg | ORAL_TABLET | Freq: Every day | ORAL | Status: DC
  Administered 2019-11-14 – 2019-11-15 (×2): 10 mg via ORAL
  Filled 2019-11-14 (×2): qty 1

## 2019-11-14 NOTE — Progress Notes (Signed)
SURGICAL PROGRESS NOTE   Hospital Day(s): 2.   Post op day(s):  Marland Kitchen   Interval History: Patient seen and examined, no acute events or new complaints overnight. Patient reports having 7 episodes of bowel movement. He reports the abdomen is softer, and non tender. He is asking for solid food. He was able to ambulate 1400 steps yesterday.  Vital signs in last 24 hours: [min-max] current  Temp:  [97.8 F (36.6 C)-98.7 F (37.1 C)] 97.8 F (36.6 C) (10/08 0728) Pulse Rate:  [69-86] 74 (10/08 0728) Resp:  [16-22] 17 (10/08 0728) BP: (155-176)/(82-96) 164/96 (10/08 0728) SpO2:  [87 %-99 %] 98 % (10/08 0728) Weight:  [90.7 kg] 90.7 kg (10/07 1657)     Height: 6' (182.9 cm) Weight: 90.7 kg BMI (Calculated): 27.11   Physical Exam:  Constitutional: alert, cooperative and no distress  Respiratory: breathing non-labored at rest  Cardiovascular: regular rate and sinus rhythm  Gastrointestinal: soft, non-tender, and non-distended  Labs:  CBC Latest Ref Rng & Units 11/13/2019 11/12/2019 11/12/2019  WBC 4.0 - 10.5 K/uL 7.1 10.6(H) 10.2  Hemoglobin 13.0 - 17.0 g/dL 38.1 82.9 17.7(H)  Hematocrit 39 - 52 % 45.4 47.8 51.1  Platelets 150 - 400 K/uL 155 182 205   CMP Latest Ref Rng & Units 11/14/2019 11/13/2019 11/12/2019  Glucose 70 - 99 mg/dL 937(J) 696(V) 893(Y)  BUN 6 - 20 mg/dL 15 15 15   Creatinine 0.61 - 1.24 mg/dL 1.01 7.51  Sodium 135 - 145 mmol/L 140 138 135  Potassium 3.5 - 5.1 mmol/L 3.7 4.2 4.3  Chloride 98 - 111 mmol/L 105 106 103  CO2 22 - 32 mmol/L 27 24 23   Calcium 8.9 - 10.3 mg/dL 0.25) ) 8.5(I)  Total Protein 6.5 - 8.1 g/dL - - 7.3  Total Bilirubin 0.3 - 1.2 mg/dL - - 1.6(H)  Alkaline Phos 38 - 126 U/L - - 68  AST 15 - 41 U/L - - 15  ALT 0 - 44 U/L - - 21    Imaging studies: I evaluated the abdominal x-ray taking last night with contrast in the small bowel.  I also evaluated the abdominal x-ray taken this morning with contrast throughout the whole large intestine.   There is still some air-fluid levels.  No free air.   Assessment/Plan:  59 y.o. male with small bowel obstruction, complicated by pertinent comorbidities including hypertension, diabetes mellitius.  Today the patient with significantly improved abdominal exam.  The patient is passing gas and having multiple bowel movements.  Most likely this is due to the oral contrast.  Due to this improvement I will clamp the NG and give him a trial of clear liquids.  Patient is ambulating.  Hopefully will continue resolving to be able to advance diet.  I will continue to follow along.  Patient encouraged to ambulate.  2.4(M, MD

## 2019-11-14 NOTE — Progress Notes (Signed)
PROGRESS NOTE    Jeremy [name]  [name] DOB: 11/18/1960 DOA: 11/12/2019 PCP: Smitty CordsKaramalegos, Alexander J, DO   Chief Complain: Abdominal pain  Brief Narrative: Patient is a 59 year old male with history of hypertension, GERD, prediabetes, Covid infection about 6 weeks ago( fully vaccinated) who presents to the emergency department complaints of intractable nausea, vomiting with lower abdominal pain for 3 days with no bowel movement.  On presentation he was hypertensive.  Abdomen/pelvic CT scan showed small bowel obstruction with transition point in the right midabdomen region likely due to adhesions.  General surgery consulted and following.  NG tube placed.  Started on conservative management. Overall condition is improving.  He has started having bowel movements.  Plan for discharge tomorrow to home.   Assessment & Plan:   Active Problems:   SBO (small bowel obstruction) (HCC)   Small bowel obstruction : Likely secondary adhesions.  CT abdomen/pelvis showed small bowel ulcers with transition point in the right mid abdomen.  He had history of appendicectomy and bilateral inguinal hernia repair General surgery consulted and following.  Started on conservative management. Started  NG tube.  Started on  IV fluids, antiemetics, pain medications. He has started having bowel movements.  NG tube clamped.  Started on clear liquid diet.  Plan for discharge tomorrow to home.  Hypertension: Continue current medications.  Monitor blood pressure  Pre diabetes type 2: Continue current insulin regimen.  On Metformin at home.  Hemoglobin A1c of 5.8  GERD: Continue PPI  Covid infection: He had Covid infection about 6 weeks ago.  Currently out of window for isolation.  He has been fully vaccinated with Moderna          DVT prophylaxis:Lovenox Code Status: Full Family Communication: None at the bedside Status is: Inpatient  Remains inpatient appropriate because:Inpatient level of care  appropriate due to severity of illness   Dispo: The patient is from: Home              Anticipated d/c is to: Home              Anticipated d/c date is: 1 day              Patient currently is not medically stable to d/c.     Consultants: Surgery  Procedures:None  Antimicrobials:  Anti-infectives (From admission, onward)   None      Subjective:  Patient seen and examined the bedside this morning.  Very comfortable.  Had multiple episodes of bowel movements since yesterday.  NG tube has been clamped.  Clear liquid diet started.  Objective: Vitals:   11/13/19 1950 11/13/19 2319 11/14/19 0434 11/14/19 0728  BP: (!) 166/88 (!) 156/85 (!) 174/82 (!) 164/96  Pulse: 86 74 71 74  Resp: 16 16 16 17   Temp: 98.1 F (36.7 C) 98.1 F (36.7 C) 98.2 F (36.8 C) 97.8 F (36.6 C)  TempSrc: Oral     SpO2: 98% 98% 99% 98%  Weight:      Height:        Intake/Output Summary (Last 24 hours) at 11/14/2019 0751 Last data filed at 11/14/2019 0300 Gross per 24 hour  Intake 1739.21 ml  Output 1000 ml  Net 739.21 ml   Filed Weights   11/12/19 1738 11/13/19 1657  Weight: 86.2 kg 90.7 kg    Examination:  General exam: Appears calm and comfortable ,Not in distress,average built HEENT:PERRL,Oral mucosa moist, Ear/Nose normal on gross exam,NG tube Respiratory system: Bilateral equal air entry, normal  vesicular breath sounds, no wheezes or crackles  Cardiovascular system: S1 & S2 heard, RRR. No JVD, murmurs, rubs, gallops or clicks. Gastrointestinal system: Abdomen is nondistended, soft and nontender. No organomegaly or masses felt. Normal bowel sounds heard. Central nervous system: Alert and oriented. No focal neurological deficits. Extremities: No edema, no clubbing ,no cyanosis, distal peripheral pulses palpable. Skin: No rashes, lesions or ulcers,no icterus ,no pallor    Data Reviewed: I have personally reviewed following labs and imaging studies  CBC: Recent Labs  Lab  11/12/19 1459 11/12/19 1742 11/13/19 0500  WBC 10.2 10.6* 7.1  NEUTROABS 7.3  --   --   HGB 17.7* 17.0 16.1  HCT 51.1 47.8 45.4  MCV 92.2 90.7 91.7  PLT 205 182 155   Basic Metabolic Panel: Recent Labs  Lab 11/12/19 1459 11/12/19 1742 11/13/19 0500 11/14/19 0242  NA 133* 135 138 140  K 4.2 4.3 4.2 3.7  CL 102 103 106 105  CO2 22 23 24 27   GLUCOSE 122* 114* 111* 100*  BUN 17 15 15 15   CREATININE 0.79 0.85 0.70 0.78  CALCIUM 9.2 8.8* 8.5* 8.7*   GFR: Estimated Creatinine Clearance: 109.1 mL/min (by C-G formula based on SCr of 0.78 mg/dL). Liver Function Tests: Recent Labs  Lab 11/12/19 1459 11/12/19 1742  AST 18 15  ALT 22 21  ALKPHOS 74 68  BILITOT 1.7* 1.6*  PROT 7.8 7.3  ALBUMIN 4.4 4.3   Recent Labs  Lab 11/12/19 1459 11/12/19 1742  LIPASE 27 25   No results for input(s): AMMONIA in the last 168 hours. Coagulation Profile: No results for input(s): INR, PROTIME in the last 168 hours. Cardiac Enzymes: No results for input(s): CKTOTAL, CKMB, CKMBINDEX, TROPONINI in the last 168 hours. BNP (last 3 results) No results for input(s): PROBNP in the last 8760 hours. HbA1C: Recent Labs    11/12/19 1459  HGBA1C 5.8*   CBG: Recent Labs  Lab 11/12/19 2350 11/13/19 2152 11/14/19 0314  GLUCAP 112* 123* 90   Lipid Profile: No results for input(s): CHOL, HDL, LDLCALC, TRIG, CHOLHDL, LDLDIRECT in the last 72 hours. Thyroid Function Tests: No results for input(s): TSH, T4TOTAL, FREET4, T3FREE, THYROIDAB in the last 72 hours. Anemia Panel: No results for input(s): VITAMINB12, FOLATE, FERRITIN, TIBC, IRON, RETICCTPCT in the last 72 hours. Sepsis Labs: No results for input(s): PROCALCITON, LATICACIDVEN in the last 168 hours.  No results found for this or any previous visit (from the past 240 hour(s)).       Radiology Studies: CT ABDOMEN PELVIS WO CONTRAST  Result Date: 11/12/2019 CLINICAL DATA:  Abdomen pain nausea vomiting EXAM: CT ABDOMEN AND PELVIS  WITHOUT CONTRAST TECHNIQUE: Multidetector CT imaging of the abdomen and pelvis was performed following the standard protocol without IV contrast. COMPARISON:  CT 12/13/2018 FINDINGS: Lower chest: Lung bases demonstrate no acute consolidation or effusion. Cardiac size within normal limits. Mild coronary vascular calcification. Hepatobiliary: No focal liver abnormality is seen. No gallstones, gallbladder wall thickening, or biliary dilatation. Pancreas: Unremarkable. No pancreatic ductal dilatation or surrounding inflammatory changes. Spleen: Normal in size without focal abnormality. Adrenals/Urinary Tract: Adrenal glands are unremarkable. Kidneys are normal, without renal calculi, focal lesion, or hydronephrosis. Bladder is unremarkable. Stomach/Bowel: Mild fluid-filled stomach. Fluid-filled mildly dilated mid to distal small bowel with bowel loops measuring up to 3 cm. Fecalized segment of distal small bowel in the right upper quadrant, series 2, image number 42 with transition to decompressed distal small bowel in the right mid abdominal region, coronal  series 5, image number 38. Small bowel distal to this is decompressed. Patient is status post appendectomy. Diffuse diverticular disease of the colon. Redemonstrated small bowel diverticula at the duodenal jejunal junction. Vascular/Lymphatic: Moderate aortic atherosclerosis. No aneurysm. No suspicious nodes Reproductive: Prostate is unremarkable.  Prostate calcification. Other: No free air. Small amount of free fluid within the right upper quadrant and lower abdomen. 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 IMPRESSION: 1. Findings are suspicious for small bowel obstruction with suspected transition point in the right mid abdominal region, likely due to adhesions. 2. Small amount of free fluid within the right upper quadrant and lower abdomen. No free air. 3. Diffuse diverticular disease of the colon without acute inflammation.  4. 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 These results will be called to the ordering clinician or representative by the Radiologist Assistant, and communication documented in the PACS or [employer]. Aortic Atherosclerosis (ICD10-I70.0). Electronically Signed   By: Jasmine Pang M.D.   On: 11/12/2019 16:10   DG Abdomen 1 View  Result Date: 11/12/2019 CLINICAL DATA:  Post NG tube placement EXAM: ABDOMEN - 1 VIEW COMPARISON:  None. FINDINGS: The bowel gas pattern is normal. Tip the NG tube is seen within the proximal stomach. No radio-opaque calculi or other significant radiographic abnormality are seen. IMPRESSION: Tip of the NG tube in the proximal stomach. Electronically Signed   By: Jonna Clark M.D.   On: 11/12/2019 21:16   DG Abd 2 Views  Result Date: 11/13/2019 CLINICAL DATA:  Small-bowel obstruction. Abdominal pain and distention. NG tube. EXAM: ABDOMEN - 2 VIEW COMPARISON:  11/12/2019.  CT 11/12/2019. FINDINGS: NG tube noted in stable position with tip in the stomach. Small-bowel distention with scattered air-fluid levels again noted consistent with previously identified probable small bowel obstruction. No free air identified. Stool in the colon. Stable calcific density noted over the left abdomen. Thoracolumbar degenerative change and scoliosis. Degenerative changes both hips. IMPRESSION: 1. NG tube in stable position with tip in the stomach. 2. Small-bowel distention with scattered air-fluid levels again noted consistent with previously identified probable small bowel obstruction. Electronically Signed   By: Maisie Fus  Register   On: 11/13/2019 07:16   DG Abd Portable 1V-Small Bowel Obstruction Protocol-initial, 8 hr delay  Result Date: 11/13/2019 CLINICAL DATA:  Small-bowel obstruction EXAM: PORTABLE ABDOMEN - 1 VIEW COMPARISON:  11/13/2019 FINDINGS: Nasogastric tube is seen with its tip overlying the expected gastric fundus. Multiple dilated loops of small bowel  are seen throughout the abdomen in keeping with a distal small bowel obstruction, similar to that noted on prior examination. High density material is seen within several distal loops of dilated small bowel, however, there is no definite extension into the ascending colon. No gross free intraperitoneal gas. IMPRESSION: Persistent, unchanged distal small bowel obstruction. Electronically Signed   By: Helyn Numbers MD   On: 11/13/2019 16:54        Scheduled Meds: . atorvastatin  20 mg Oral Daily  . enoxaparin (LOVENOX) injection  40 mg Subcutaneous Q24H  . fluticasone  2 spray Each Nare Daily  . influenza vac split quadrivalent PF  0.5 mL Intramuscular Tomorrow-1000  . insulin aspart  0-9 Units Subcutaneous Q6H  . pantoprazole (PROTONIX) IV  40 mg Intravenous Q12H   Continuous Infusions: . sodium chloride Stopped (11/13/19 1643)     LOS: 2 days    Time spent: 25 mins.,More than 50% of that time was spent  in counseling and/or coordination of care.      Burnadette Pop, MD [facility] P10/09/2019, 7:51 AM

## 2019-11-14 NOTE — Progress Notes (Signed)
SURGERY FOLLOW UP NOTE  Patient re evaluated this evening. Patient reports doing great. Denies abdominal pian. No pain radiation. Reports continue passing a lot of gas and having multiple bowel movements. Reports tolerated the clear liquid in the morning and full liquid for lunch and supper.   Vitals:   11/14/19 0728 11/14/19 1642  BP: (!) 164/96 (!) 150/79  Pulse: 74 76  Resp: 17 17  Temp: 97.8 F (36.6 C) 98 F (36.7 C)  SpO2: 98% 99%   General: alert, oriented x3 Abdomen: soft and depressible, non distended, non tender.   A/P: patient tolerating diet progression. Continue having bowel movement. Clinically this is signs of resolving SBO. Will advance diet to soft diet for breakfast and if he continue recovering like he is doing may consider discharge tomorrow. Patient report have not slept well this last two days. He uses Ambien at home PRN. Will order Ambien for tonight.   This was a 30 minutes follow up encounter most of the time counseling the patient and coordinating plan of care.  Carolan Shiver, MD

## 2019-11-15 LAB — BASIC METABOLIC PANEL
Anion gap: 5 (ref 5–15)
BUN: 11 mg/dL (ref 6–20)
CO2: 28 mmol/L (ref 22–32)
Calcium: 9 mg/dL (ref 8.9–10.3)
Chloride: 104 mmol/L (ref 98–111)
Creatinine, Ser: 0.8 mg/dL (ref 0.61–1.24)
GFR, Estimated: >60 mL/min (ref >60)
Glucose, Bld: 104 mg/dL — ABNORMAL HIGH (ref 70–99)
Potassium: 4.8 mmol/L (ref 3.5–5.1)
Sodium: 137 mmol/L (ref 135–145)

## 2019-11-15 LAB — GLUCOSE, CAPILLARY: Glucose-Capillary: 99 mg/dL (ref 70–99)

## 2019-11-15 NOTE — Progress Notes (Signed)
SURGICAL PROGRESS NOTE   Hospital Day(s): 3.   Post op day(s):  Jeremy [name]   Interval History: Patient seen and examined, no acute events or new complaints overnight. Patient reports feeling well. He reports he continue passing lots of gas and having bowel movement. Patient tolerated soft diet as breakfast. No nausea or vomiting.   Vital signs in last 24 hours: [min-max] current  Temp:  [98 F (36.7 C)-98.7 F (37.1 C)] 98.6 F (37 C) (10/09 0751) Pulse Rate:  [70-80] 80 (10/09 0751) Resp:  [17-18] 17 (10/09 0751) BP: (144-151)/(79-103) 151/103 (10/09 0751) SpO2:  [98 %-99 %] 99 % (10/09 0751)     Height: 6' (182.9 cm) Weight: 90.7 kg BMI (Calculated): 27.11   Physical Exam:  Constitutional: alert, cooperative and no distress  Respiratory: breathing non-labored at rest  Cardiovascular: regular rate and sinus rhythm  Gastrointestinal: soft, non-tender, and non-distended  Labs:  CBC Latest Ref Rng & Units 11/13/2019 11/12/2019 11/12/2019  WBC 4.0 - 10.5 K/uL 7.1 10.6(H) 10.2  Hemoglobin 13.0 - 17.0 g/dL 41.6 60.6 17.7(H)  Hematocrit 39 - 52 % 45.4 47.8 51.1  Platelets 150 - 400 K/uL 155 182 205   CMP Latest Ref Rng & Units 11/15/2019 11/14/2019 11/13/2019  Glucose 70 - 99 mg/dL 301(S) 010(X) 323(F)  BUN 6 - 20 mg/dL 11 15 15   Creatinine 0.61 - 1.24 mg/dL 5.73 2.20  Sodium 135 - 145 mmol/L 137 140 138  Potassium 3.5 - 5.1 mmol/L 4.8 3.7 4.2  Chloride 98 - 111 mmol/L 104 105 106  CO2 22 - 32 mmol/L 28 27 24   Calcium 8.9 - 10.3 mg/dL 9.0 2.54) )  Total Protein 6.5 - 8.1 g/dL - - -  Total Bilirubin 0.3 - 1.2 mg/dL - - -  Alkaline Phos 38 - 126 U/L - - -  AST 15 - 41 U/L - - -  ALT 0 - 44 U/L - - -    Imaging studies: No new pertinent imaging studies   Assessment/Plan:  59 y.o.malewith small bowel obstruction, complicated by pertinent comorbidities includinghypertension, diabetes [place].  Patient with resolved small bowel obstruction. This is the first episode most  likely due to adhesions due to open appendectomy >30 years ago. No indication for surgical management in this admission. Patient tolerating soft diet. I had a long discussion with patient that there is no specific diet to prevent or decrease risk of bowel obstruction. Healthy diet recommended. Also recommended small frequent meals instead of 3 large meals. No surgical follow up needed at this moment. Patient can be discharge from my standpoint.   6.2(B, MD

## 2019-11-15 NOTE — Progress Notes (Signed)
IV removed from patient. Discharge instructions given to patient. Verbalized understanding. No acute distress at this time. Patient called wife to transport him home. Patient will call for a wheelchair once his wife arrives.

## 2019-11-15 NOTE — Discharge Summary (Signed)
Physician Discharge Summary  Jeremy [name] YQM:[id number] DOB: 1960/08/28 DOA: 11/12/2019  PCP: Smitty Cords, DO  Admit date: 11/12/2019 Discharge date: 11/15/2019  Admitted From: Home Disposition:  Home  Discharge Condition:Stable CODE STATUS:FULL Diet recommendation: Heart Healthy  Brief/Interim Summary: Patient is a 59 year old male with history of hypertension, GERD, prediabetes, Covid infection about 6 weeks ago( fully vaccinated) who presents to the emergency department complaints of intractable nausea, vomiting with lower abdominal pain for 3 days with no bowel movement.  On presentation he was hypertensive.  Abdomen/pelvic CT scan showed small bowel obstruction with transition point in the right midabdomen region likely due to adhesions.  General surgery consulted and following.  NG tube placed.  Started on conservative management. He  started having bowel movements and tolerating diet now.  NG tube removed.  Medically stable for discharge to home today.  Following problems were addressed during his hospitalization:   Small bowel obstruction : Likely secondary adhesions.  CT abdomen/pelvis showed small bowel ulcers with transition point in the right mid abdomen.  He had history of appendicectomy and bilateral inguinal hernia repair General surgery consulted and were following.  Started on conservative management. He  started having bowel movements and tolerating diet now.  NG tube removed  Hypertension: Continue current medications.  Monitor blood pressure  Pre diabetes type 2: Continue current insulin regimen.  On Metformin at home.  Hemoglobin A1c of 5.8  GERD: Continue PPI  Covid infection: He had Covid infection about 6 weeks ago.  Currently out of window for isolation.  He has been fully vaccinated with Moderna    Discharge Diagnoses:  Active Problems:   SBO (small bowel obstruction) [facility])    Discharge Instructions  Discharge Instructions     Diet - low sodium heart healthy   Complete by: As directed    Discharge instructions   Complete by: As directed    Please follow up with your PCP in 1 to 2 weeks.   Increase activity slowly   Complete by: As directed      Allergies as of 11/15/2019      Reactions   Cephalexin Nausea And Vomiting      Medication List    TAKE these medications   amLODipine-benazepril 5-10 MG capsule Commonly known as: LOTREL TAKE 1 CAPSULE BY MOUTH EVERY DAY   atorvastatin 20 MG tablet Commonly known as: LIPITOR TAKE 1 TABLET BY MOUTH EVERY DAY   cetirizine 10 MG tablet Commonly known as: ZYRTEC Take 10 mg by mouth daily.   clobetasol cream 0.05 % Commonly known as: TEMOVATE Apply topically.   fluticasone 50 MCG/ACT nasal spray Commonly known as: FLONASE PLACE 2 SPRAYS INTO BOTH NOSTRILS DAILY. USE FOR 4-6 WEEKS THEN STOP AND USE SEASONALLY What changed: See the new instructions.   hydrocortisone 25 MG suppository Commonly known as: ANUSOL-HC Place 1 suppository (25 mg total) rectally 2 (two) times daily. For 7 days   metFORMIN 500 MG tablet Commonly known as: GLUCOPHAGE TAKE 1 TABLET BY MOUTH EVERY DAY WITH BREAKFAST What changed: See the new instructions.   multivitamin tablet Take 1 tablet by mouth daily. Men's a day   pantoprazole 40 MG tablet Commonly known as: PROTONIX Take 1 tablet (40 mg total) by mouth daily as needed.       Follow-up Information    Smitty Cords, DO. Schedule an appointment as soon as possible for a visit in 1 week(s).   Specialty: Family Medicine Contact information: [address]  [place] [address] [phone]              Allergies  Allergen Reactions  . Cephalexin Nausea And Vomiting    Consultations:  Surgery   Procedures/Studies: CT ABDOMEN PELVIS WO CONTRAST  Result Date: 11/12/2019 CLINICAL DATA:  Abdomen pain nausea vomiting EXAM: CT ABDOMEN AND PELVIS WITHOUT CONTRAST TECHNIQUE: Multidetector CT imaging of  the abdomen and pelvis was performed following the standard protocol without IV contrast. COMPARISON:  CT 12/13/2018 FINDINGS: Lower chest: Lung bases demonstrate no acute consolidation or effusion. Cardiac size within normal limits. Mild coronary vascular calcification. Hepatobiliary: No focal liver abnormality is seen. No gallstones, gallbladder wall thickening, or biliary dilatation. Pancreas: Unremarkable. No pancreatic ductal dilatation or surrounding inflammatory changes. Spleen: Normal in size without focal abnormality. Adrenals/Urinary Tract: Adrenal glands are unremarkable. Kidneys are normal, without renal calculi, focal lesion, or hydronephrosis. Bladder is unremarkable. Stomach/Bowel: Mild fluid-filled stomach. Fluid-filled mildly dilated mid to distal small bowel with bowel loops measuring up to 3 cm. Fecalized segment of distal small bowel in the right upper quadrant, series 2, image number 42 with transition to decompressed distal small bowel in the right mid abdominal region, coronal series 5, image number 38. Small bowel distal to this is decompressed. Patient is status post appendectomy. Diffuse diverticular disease of the colon. Redemonstrated small bowel diverticula at the duodenal jejunal junction. Vascular/Lymphatic: Moderate aortic atherosclerosis. No aneurysm. No suspicious nodes Reproductive: Prostate is unremarkable.  Prostate calcification. Other: No free air. Small amount of free fluid within the right upper quadrant and lower abdomen. 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 IMPRESSION: 1. Findings are suspicious for small bowel obstruction with suspected transition point in the right mid abdominal region, likely due to adhesions. 2. Small amount of free fluid within the right upper quadrant and lower abdomen. No free air. 3. Diffuse diverticular disease of the colon without acute inflammation. 4. Chronic pars defect at L5 with trace anterolisthesis  L5 on S1. These results will be called to the ordering clinician or representative by the Radiologist Assistant, and communication documented in the PACS or [employer]. Aortic Atherosclerosis (ICD10-I70.0). Electronically Signed   By: Jasmine Pang M.D.   On: 11/12/2019 16:10   DG Abdomen 1 View  Result Date: 11/12/2019 CLINICAL DATA:  Post NG tube placement EXAM: ABDOMEN - 1 VIEW COMPARISON:  None. FINDINGS: The bowel gas pattern is normal. Tip the NG tube is seen within the proximal stomach. No radio-opaque calculi or other significant radiographic abnormality are seen. IMPRESSION: Tip of the NG tube in the proximal stomach. Electronically Signed   By: Jonna Clark M.D.   On: 11/12/2019 21:16   DG Abd 2 Views  Result Date: 11/14/2019 CLINICAL DATA:  Bowel obstruction, NG tube in place EXAM: ABDOMEN - 2 VIEW COMPARISON:  11/13/2019 FINDINGS: Enteric tube passes into the stomach. There is residual contrast within a decompressed colon. There are persistent dilated loops of small bowel similar to the prior study. Air-fluid levels are present. No free air. IMPRESSION: Similar appearance of small-bowel obstruction. Electronically Signed   By: Guadlupe Spanish M.D.   On: 11/14/2019 08:17   DG Abd 2 Views  Result Date: 11/13/2019 CLINICAL DATA:  Small-bowel obstruction. Abdominal pain and distention. NG tube. EXAM: ABDOMEN - 2 VIEW COMPARISON:  11/12/2019.  CT 11/12/2019. FINDINGS: NG tube noted in stable position with tip in the stomach. Small-bowel distention with scattered air-fluid levels again noted consistent with previously identified probable small  bowel obstruction. No free air identified. Stool in the colon. Stable calcific density noted over the left abdomen. Thoracolumbar degenerative change and scoliosis. Degenerative changes both hips. IMPRESSION: 1. NG tube in stable position with tip in the stomach. 2. Small-bowel distention with scattered air-fluid levels again noted consistent with  previously identified probable small bowel obstruction. Electronically Signed   By: Maisie Fus  Register   On: 11/13/2019 07:16   DG Abd Portable 1V-Small Bowel Obstruction Protocol-initial, 8 hr delay  Result Date: 11/13/2019 CLINICAL DATA:  Small-bowel obstruction EXAM: PORTABLE ABDOMEN - 1 VIEW COMPARISON:  11/13/2019 FINDINGS: Nasogastric tube is seen with its tip overlying the expected gastric fundus. Multiple dilated loops of small bowel are seen throughout the abdomen in keeping with a distal small bowel obstruction, similar to that noted on prior examination. High density material is seen within several distal loops of dilated small bowel, however, there is no definite extension into the ascending colon. No gross free intraperitoneal gas. IMPRESSION: Persistent, unchanged distal small bowel obstruction. Electronically Signed   By: Helyn Numbers MD   On: 11/13/2019 16:54   CT CHEST LUNG CANCER SCREENING LOW DOSE WO CONTRAST  Addendum Date: 10/30/2019   ADDENDUM REPORT: 10/30/2019 08:34 ADDENDUM: The following is a correction to the body of the report: Lungs/Pleura: Mild changes of centrilobular emphysema. There is no pleural effusion, airspace consolidation or atelectasis identified. The previously noted lung nodules are unchanged when compared with previous exam. There is a new part solid nodule within the subpleural aspect of the posterior right lower lobe which measures 13.7 mm which has an internal solid component measuring 6.5 mm, image 199/3. Also new is a ground-glass nodule within the posterior right lower lobe with an equivalent diameter of 6.9 mm. Electronically Signed   By: Signa Kell M.D.   On: 10/30/2019 08:34   Result Date: 10/30/2019 CLINICAL DATA:  Lung cancer screening. Former asymptomatic smoker with 35 pack-year history. EXAM: CT CHEST WITHOUT CONTRAST LOW-DOSE FOR LUNG CANCER SCREENING TECHNIQUE: Multidetector CT imaging of the chest was performed following the standard protocol  without IV contrast. COMPARISON:  06/04/2018 FINDINGS: Cardiovascular: Normal heart size. No pericardial effusion identified. Aortic atherosclerosis. Coronary artery atherosclerotic calcifications noted. Mediastinum/Nodes: No enlarged mediastinal, hilar, or axillary lymph nodes. Thyroid gland, trachea, and esophagus demonstrate no significant findings. Lungs/Pleura: Mild changes of centrilobular emphysema. There is no pleural effusion, airspace consolidation or atelectasis identified. The previously noted lung nodules are unchanged when compared with previous exam. There is a new part solid nodule within the subpleural aspect of the posterior right lower lobe which measures 13.7 cm which has an internal solid component measuring 6.5 mm, image 199/3. Also new is a ground-glass nodule within the posterior right lower lobe with an equivalent diameter of 6.9 mm. Upper Abdomen: No acute abnormality. Musculoskeletal: Mild spondylosis identified within the thoracic spine. No acute or suspicious osseous findings identified. IMPRESSION: 1. Lung-RADS 4B, suspicious. Additional imaging evaluation or consultation with Pulmonology or Thoracic Surgery recommended. 2. Coronary artery calcifications noted. 3. Emphysema and aortic atherosclerosis. Aortic Atherosclerosis (ICD10-I70.0) and Emphysema (ICD10-J43.9). Electronically Signed: By: Signa Kell M.D. On: 10/29/2019 14:06       Subjective: Patient seen and examined at the bedside this morning.  Comfortable.  No abdominal pain, nausea or vomiting.  Having bowel movements.  Tolerating  soft diet.   Discharge Exam: Vitals:   11/14/19 2339 11/15/19 0751  BP: (!) 144/83 (!) 151/103  Pulse: 70 80  Resp: 18 17  Temp: 98.7 F (  37.1 C) 98.6 F (37 C)  SpO2: 98% 99%   Vitals:   11/14/19 0728 11/14/19 1642 11/14/19 2339 11/15/19 0751  BP: (!) 164/96 (!) 150/79 (!) 144/83 (!) 151/103  Pulse: 74 76 70 80  Resp: 17 17 18 17   Temp: 97.8 F (36.6 C) 98 F (36.7 C)  98.7 F (37.1 C) 98.6 F (37 C)  TempSrc:  Oral  Oral  SpO2: 98% 99% 98% 99%  Weight:      Height:        General: Pt is alert, awake, not in acute distress Cardiovascular: RRR, S1/S2 +, no rubs, no gallops Respiratory: CTA bilaterally, no wheezing, no rhonchi Abdominal: Soft, NT, ND, bowel sounds + Extremities: no edema, no cyanosis    The results of significant diagnostics from this hospitalization (including imaging, microbiology, ancillary and laboratory) are listed below for reference.     Microbiology: No results found for this or any previous visit (from the past 240 hour(s)).   Labs: BNP (last 3 results) No results for input(s): BNP in the last 8760 hours. Basic Metabolic Panel: Recent Labs  Lab 11/12/19 1459 11/12/19 1742 11/13/19 0500 11/14/19 0242 11/15/19 0405  NA 133* 135 138 140 137  K 4.2 4.3 4.2 3.7 4.8  CL 102 103 106 105 104  CO2 22 23 24 27 28   GLUCOSE 122* 114* 111* 100* 104*  BUN 17 15 15 15 11   CREATININE 0.79 0.85 0.70 0.78 0.80  CALCIUM 9.2 8.8* 8.5* 8.7* 9.0   Liver Function Tests: Recent Labs  Lab 11/12/19 1459 11/12/19 1742  AST 18 15  ALT 22 21  ALKPHOS 74 68  BILITOT 1.7* 1.6*  PROT 7.8 7.3  ALBUMIN 4.4 4.3   Recent Labs  Lab 11/12/19 1459 11/12/19 1742  LIPASE 27 25   No results for input(s): AMMONIA in the last 168 hours. CBC: Recent Labs  Lab 11/12/19 1459 11/12/19 1742 11/13/19 0500  WBC 10.2 10.6* 7.1  NEUTROABS 7.3  --   --   HGB 17.7* 17.0 16.1  HCT 51.1 47.8 45.4  MCV 92.2 90.7 91.7  PLT 205 182 155   Cardiac Enzymes: No results for input(s): CKTOTAL, CKMB, CKMBINDEX, TROPONINI in the last 168 hours. BNP: Invalid input(s): POCBNP CBG: Recent Labs  Lab 11/14/19 0751 11/14/19 1208 11/14/19 1545 11/14/19 2126 11/15/19 0303  GLUCAP 108* 126* 98 99 99   D-Dimer No results for input(s): DDIMER in the last 72 hours. Hgb A1c Recent Labs    11/12/19 1459  HGBA1C 5.8*   Lipid Profile No  results for input(s): CHOL, HDL, LDLCALC, TRIG, CHOLHDL, LDLDIRECT in the last 72 hours. Thyroid function studies No results for input(s): TSH, T4TOTAL, T3FREE, THYROIDAB in the last 72 hours.  Invalid input(s): FREET3 Anemia work up No results for input(s): VITAMINB12, FOLATE, FERRITIN, TIBC, IRON, RETICCTPCT in the last 72 hours. Urinalysis    Component Value Date/Time   COLORURINE YELLOW 11/12/2019 1601   APPEARANCEUR CLEAR 11/12/2019 1601   LABSPEC 1.025 11/12/2019 1601   PHURINE 5.5 11/12/2019 1601   GLUCOSEU NEGATIVE 11/12/2019 1601   HGBUR NEGATIVE 11/12/2019 1601   BILIRUBINUR MODERATE (A) 11/12/2019 1601   KETONESUR 80 (A) 11/12/2019 1601   PROTEINUR NEGATIVE 11/12/2019 1601   NITRITE NEGATIVE 11/12/2019 1601   LEUKOCYTESUR NEGATIVE 11/12/2019 1601   Sepsis Labs Invalid input(s): PROCALCITONIN,  WBC,  LACTICIDVEN Microbiology No results found for this or any previous visit (from the past 240 hour(s)).  Please note: You were cared for  by a hospitalist during your hospital stay. Once you are discharged, your primary care physician will handle any further medical issues. Please note that NO REFILLS for any discharge medications will be authorized once you are discharged, as it is imperative that you return to your primary care physician (or establish a relationship with a primary care physician if you do not have one) for your post hospital discharge needs so that they can reassess your need for medications and monitor your lab values.    Time coordinating discharge: 40 minutes  SIGNED:   Burnadette PopAmrit Syrena Burges, MD  [facility] 11/15/2019, 11:02 AM Pager [phone]  If 7PM-7AM, please contact night-coverage [email or web address] Password [email or web address]

## 2019-11-17 ENCOUNTER — Ambulatory Visit: Payer: BC Managed Care – PPO

## 2019-11-20 ENCOUNTER — Other Ambulatory Visit: Payer: Self-pay

## 2019-11-20 ENCOUNTER — Encounter: Payer: Self-pay | Admitting: Family Medicine

## 2019-11-20 ENCOUNTER — Other Ambulatory Visit: Payer: Self-pay | Admitting: Family Medicine

## 2019-11-20 ENCOUNTER — Ambulatory Visit (INDEPENDENT_AMBULATORY_CARE_PROVIDER_SITE_OTHER): Payer: BC Managed Care – PPO | Admitting: Family Medicine

## 2019-11-20 VITALS — BP 124/76 | HR 79 | Temp 97.7°F | Resp 16 | Ht 72.0 in | Wt 207.6 lb

## 2019-11-20 DIAGNOSIS — K56609 Unspecified intestinal obstruction, unspecified as to partial versus complete obstruction: Secondary | ICD-10-CM | POA: Diagnosis not present

## 2019-11-20 DIAGNOSIS — Z23 Encounter for immunization: Secondary | ICD-10-CM

## 2019-11-20 DIAGNOSIS — R17 Unspecified jaundice: Secondary | ICD-10-CM | POA: Diagnosis not present

## 2019-11-20 DIAGNOSIS — R7303 Prediabetes: Secondary | ICD-10-CM

## 2019-11-20 NOTE — Progress Notes (Addendum)
Subjective:    Patient ID: Jeremy [name], male    DOB: [birth year], 59 y.o.   MRN: [id number]  Jeremy [name] is a 59 y.o. male presenting on 11/20/2019 for Hospitalization Follow-up (SBO)   HPI  HOSPITAL FOLLOW-UP VISIT  Hospital/Location: [facility] Date of Admission: 11/12/19 Date of Discharge: 11/15/19 Transitions of care telephone call: not completed.  Reason for Admission: Acute abdominal pain Primary (+Secondary) Diagnosis: SBO  - Hospital H&P and Discharge Summary have been reviewed - Patient presents today 5 days after recent hospitalization. Brief summary of recent course, patient had symptoms of acute abdominal pain, hospitalized, identified SBO after initial imaging, treated with NPO NG tube decompression.  - Today reports overall has done well after discharge. Symptoms of loose stool diarrhea had been persistent, now today he had his first semi-solid BM this morning.  He has history of pulmonary nodule identified on last LDCT now has repeat in 3 months approximately 01/28/20.  He has history in past with Diverticulitis 1 year ago and had Abd CT showed inflammation of pancreas.   Last Colonoscopy Dr Allegra LaiVanga AGI - 11/07/17, negative without any polyps.  He has concerns about these abdominal issues, and if there is any other potential cause of this SBO. He has history of surgery hernia and appendix.  Past Surgical History:  Procedure Laterality Date  . APPENDECTOMY    . COLONOSCOPY    . COLONOSCOPY WITH PROPOFOL N/A 11/07/2017   Procedure: COLONOSCOPY WITH PROPOFOL;  Surgeon: Toney ReilVanga, Rohini Reddy, MD;  [facility];  Service: Endoscopy;  Laterality: N/A;  . INGUINAL HERNIA REPAIR Bilateral   . NASAL SEPTOPLASTY W/ TURBINOPLASTY  2009   Dr Jenne [facility]    I have reviewed the discharge medication list, and have reconciled the current and discharge medications today.   Current Outpatient Medications:  .  amLODipine-benazepril (LOTREL) 5-10 MG capsule,  TAKE 1 CAPSULE BY MOUTH EVERY DAY, Disp: 90 capsule, Rfl: 3 .  atorvastatin (LIPITOR) 20 MG tablet, TAKE 1 TABLET BY MOUTH EVERY DAY (Patient taking differently: Take 20 mg by mouth daily. ), Disp: 90 tablet, Rfl: 3 .  cetirizine (ZYRTEC) 10 MG tablet, Take 10 mg by mouth daily., Disp: , Rfl:  .  clobetasol cream (TEMOVATE) 0.05 %, Apply topically., Disp: , Rfl:  .  fluticasone (FLONASE) 50 MCG/ACT nasal spray, PLACE 2 SPRAYS INTO BOTH NOSTRILS DAILY. USE FOR 4-6 WEEKS THEN STOP AND USE SEASONALLY (Patient taking differently: Place 2 sprays into both nostrils daily. ), Disp: 16 mL, Rfl: 3 .  hydrocortisone (ANUSOL-HC) 25 MG suppository, Place 1 suppository (25 mg total) rectally 2 (two) times daily. For 7 days, Disp: 14 suppository, Rfl: 1 .  metFORMIN (GLUCOPHAGE) 500 MG tablet, TAKE 1 TABLET BY MOUTH EVERY DAY WITH BREAKFAST (Patient taking differently: Take 500 mg by mouth daily with breakfast. ), Disp: 90 tablet, Rfl: 1 .  Multiple Vitamin (MULTIVITAMIN) tablet, Take 1 tablet by mouth daily. Men's a day, Disp: , Rfl:  .  pantoprazole (PROTONIX) 40 MG tablet, Take 1 tablet (40 mg total) by mouth daily as needed., Disp: 90 tablet, Rfl: 1  ------------------------------------------------------------------------- Social History   Tobacco Use  . Smoking status: Former Smoker    Packs/day: 1.00    Years: 35.00    Pack years: 35.00    Types: Cigarettes    Quit date: 01/06/2014    Years since quitting: 5.8  . Smokeless tobacco: Former Clinical biochemistUser  Vaping Use  . Vaping Use: Never used  Substance Use Topics  . Alcohol use: Yes    Alcohol/week: 6.0 standard drinks    Types: 6 Cans of beer per week  . Drug use: No    Review of Systems Per HPI unless specifically indicated above     Objective:    BP 124/76   Pulse 79   Temp 97.7 F (36.5 C) (Temporal)   Resp 16   Ht 6' (1.829 m)   Wt 207 lb 9.6 oz (94.2 kg)   SpO2 100%   BMI 28.16 kg/m   Wt Readings from Last 3 Encounters:  11/20/19  207 lb 9.6 oz (94.2 kg)  11/13/19 199 lb 15.3 oz (90.7 kg)  10/29/19 200 lb (90.7 kg)    Physical Exam Vitals and nursing note reviewed.  Constitutional:      General: He is not in acute distress.    Appearance: He is well-developed. He is not diaphoretic.     Comments: Well-appearing, comfortable, cooperative  HENT:     Head: Normocephalic and atraumatic.  Eyes:     General:        Right eye: No discharge.        Left eye: No discharge.     Conjunctiva/sclera: Conjunctivae normal.  Neck:     Thyroid: No thyromegaly.  Cardiovascular:     Rate and Rhythm: Normal rate and regular rhythm.     Heart sounds: Normal heart sounds. No murmur heard.   Pulmonary:     Effort: Pulmonary effort is normal. No respiratory distress.     Breath sounds: Normal breath sounds. No wheezing or rales.  Abdominal:     General: There is no distension.     Palpations: Abdomen is soft. There is no mass.     Tenderness: There is no abdominal tenderness. There is no guarding or rebound.     Hernia: No hernia is present.     Comments: Mild hypoactive bowel sound but improving.  Musculoskeletal:        General: Normal range of motion.     Cervical back: Normal range of motion and neck supple.  Lymphadenopathy:     Cervical: No cervical adenopathy.  Skin:    General: Skin is warm and dry.     Findings: No erythema or rash.  Neurological:     Mental Status: He is alert and oriented to person, place, and time.  Psychiatric:        Behavior: Behavior normal.     Comments: Well groomed, good eye contact, normal speech and thoughts       Results for orders placed or performed during the hospital encounter of 11/12/19  Lipase, blood  Result Value Ref Range   Lipase 25 11 - 51 U/L  Comprehensive metabolic panel  Result Value Ref Range   Sodium 135 135 - 145 mmol/L   Potassium 4.3 3.5 - 5.1 mmol/L   Chloride 103 98 - 111 mmol/L   CO2 23 22 - 32 mmol/L   Glucose, Bld 114 (H) 70 - 99 mg/dL   BUN 15  6 - 20 mg/dL   Creatinine, Ser 0.98 0.61 - 1.24 mg/dL   Calcium 8.8 (L) 8.9 - 10.3 mg/dL   Total Protein 7.3 6.5 - 8.1 g/dL   Albumin 4.3 3.5 - 5.0 g/dL   AST 15 15 - 41 U/L   ALT 21 0 - 44 U/L   Alkaline Phosphatase 68 38 - 126 U/L   Total Bilirubin 1.6 (H) 0.3 - 1.2 mg/dL  GFR calc non Af Amer >60 >60 mL/min   Anion gap 9 5 - 15  CBC  Result Value Ref Range   WBC 10.6 (H) 4.0 - 10.5 K/uL   RBC 5.27 4.22 - 5.81 MIL/uL   Hemoglobin 17.0 13.0 - 17.0 g/dL   HCT 16.1 39 - 52 %   MCV 90.7 80.0 - 100.0 fL   MCH 32.3 26.0 - 34.0 pg   MCHC 35.6 30.0 - 36.0 g/dL   RDW 09.6 04.5 - 40.9 %   Platelets 182 150 - 400 K/uL   nRBC 0.0 0.0 - 0.2 %  Basic metabolic panel  Result Value Ref Range   Sodium 138 135 - 145 mmol/L   Potassium 4.2 3.5 - 5.1 mmol/L   Chloride 106 98 - 111 mmol/L   CO2 24 22 - 32 mmol/L   Glucose, Bld 111 (H) 70 - 99 mg/dL   BUN 15 6 - 20 mg/dL   Creatinine, Ser 8.11 0.61 - 1.24 mg/dL   Calcium 8.5 (L) 8.9 - 10.3 mg/dL   GFR calc non Af Amer >60 >60 mL/min   Anion gap 8 5 - 15  CBC  Result Value Ref Range   WBC 7.1 4.0 - 10.5 K/uL   RBC 4.95 4.22 - 5.81 MIL/uL   Hemoglobin 16.1 13.0 - 17.0 g/dL   HCT 91.4 39 - 52 %   MCV 91.7 80.0 - 100.0 fL   MCH 32.5 26.0 - 34.0 pg   MCHC 35.5 30.0 - 36.0 g/dL   RDW 78.2 95.6 - 21.3 %   Platelets 155 150 - 400 K/uL   nRBC 0.0 0.0 - 0.2 %  Hemoglobin A1c  Result Value Ref Range   Hgb A1c MFr Bld 5.8 (H) 4.8 - 5.6 %   Mean Plasma Glucose 119.76 mg/dL  HIV Antibody (routine testing w rflx)  Result Value Ref Range   HIV Screen 4th Generation wRfx Non Reactive Non Reactive  Glucose, capillary  Result Value Ref Range   Glucose-Capillary 112 (H) 70 - 99 mg/dL  Basic metabolic panel  Result Value Ref Range   Sodium 140 135 - 145 mmol/L   Potassium 3.7 3.5 - 5.1 mmol/L   Chloride 105 98 - 111 mmol/L   CO2 27 22 - 32 mmol/L   Glucose, Bld 100 (H) 70 - 99 mg/dL   BUN 15 6 - 20 mg/dL   Creatinine, Ser 0.86 0.61 - 1.24  mg/dL   Calcium 8.7 (L) 8.9 - 10.3 mg/dL   GFR calc non Af Amer >60 >60 mL/min   Anion gap 8 5 - 15  Glucose, capillary  Result Value Ref Range   Glucose-Capillary 123 (H) 70 - 99 mg/dL  Glucose, capillary  Result Value Ref Range   Glucose-Capillary 90 70 - 99 mg/dL   Comment 1 Notify RN   Glucose, capillary  Result Value Ref Range   Glucose-Capillary 108 (H) 70 - 99 mg/dL  Glucose, capillary  Result Value Ref Range   Glucose-Capillary 128 (H) 70 - 99 mg/dL  Glucose, capillary  Result Value Ref Range   Glucose-Capillary 126 (H) 70 - 99 mg/dL  Glucose, capillary  Result Value Ref Range   Glucose-Capillary 98 70 - 99 mg/dL  Basic metabolic panel  Result Value Ref Range   Sodium 137 135 - 145 mmol/L   Potassium 4.8 3.5 - 5.1 mmol/L   Chloride 104 98 - 111 mmol/L   CO2 28 22 - 32 mmol/L   Glucose,  Bld 104 (H) 70 - 99 mg/dL   BUN 11 6 - 20 mg/dL   Creatinine, Ser 8.67 0.61 - 1.24 mg/dL   Calcium 9.0 8.9 - 61.9 mg/dL   GFR, Estimated >50 >93 mL/min   Anion gap 5 5 - 15  Glucose, capillary  Result Value Ref Range   Glucose-Capillary 99 70 - 99 mg/dL   Comment 1 Notify RN   Glucose, capillary  Result Value Ref Range   Glucose-Capillary 99 70 - 99 mg/dL   Comment 1 Notify RN    I have personally reviewed the radiology report from 11/12/19 CT Abd Pelvis.  CLINICAL DATA:  Abdomen pain nausea vomiting  EXAM: CT ABDOMEN AND PELVIS WITHOUT CONTRAST  TECHNIQUE: Multidetector CT imaging of the abdomen and pelvis was performed following the standard protocol without IV contrast.  COMPARISON:  CT 12/13/2018  FINDINGS: Lower chest: Lung bases demonstrate no acute consolidation or effusion. Cardiac size within normal limits. Mild coronary vascular calcification.  Hepatobiliary: No focal liver abnormality is seen. No gallstones, gallbladder wall thickening, or biliary dilatation.  Pancreas: Unremarkable. No pancreatic ductal dilatation or surrounding inflammatory  changes.  Spleen: Normal in size without focal abnormality.  Adrenals/Urinary Tract: Adrenal glands are unremarkable. Kidneys are normal, without renal calculi, focal lesion, or hydronephrosis. Bladder is unremarkable.  Stomach/Bowel: Mild fluid-filled stomach. Fluid-filled mildly dilated mid to distal small bowel with bowel loops measuring up to 3 cm. Fecalized segment of distal small bowel in the right upper quadrant, series 2, image number 42 with transition to decompressed distal small bowel in the right mid abdominal region, coronal series 5, image number 38. Small bowel distal to this is decompressed. Patient is status post appendectomy. Diffuse diverticular disease of the colon. Redemonstrated small bowel diverticula at the duodenal jejunal junction.  Vascular/Lymphatic: Moderate aortic atherosclerosis. No aneurysm. No suspicious nodes  Reproductive: Prostate is unremarkable.  Prostate calcification.  Other: No free air. Small amount of free fluid within the right upper quadrant and lower abdomen.  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  IMPRESSION: 1. Findings are suspicious for small bowel obstruction with suspected transition point in the right mid abdominal region, likely due to adhesions. 2. Small amount of free fluid within the right upper quadrant and lower abdomen. No free air. 3. Diffuse diverticular disease of the colon without acute inflammation. 4. 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  These results will be called to the ordering clinician or representative by the Radiologist Assistant, and communication documented in the PACS or [employer].  Aortic Atherosclerosis (ICD10-I70.0).   Electronically Signed   By: Jasmine Pang M.D.   On: 11/12/2019 16:10     Assessment & Plan:   Problem List Items Addressed This Visit    RESOLVED: SBO (small bowel obstruction)  (HCC) - Primary    Resolved. Now having regular BMs but looser, starting today with semi solid Resolved abdominal pain  Encourage improve diet Monitor symptoms Concern with potential cause of this SBO, he has prior surgery but question if adhesion cause or he is worried about other possible cause such as malignancy.  Further work up pending lung nodule with repeat CT. He had CT in 12/2018 with diverticulitis, no other abnormality identified but had pancreas inflammation.  Will recommend GI consultation for evaluation with multiple episodic GI complaints now, determine if need other diagnostic possibly upper endoscopy.      Relevant Orders   Ambulatory referral to  Gastroenterology    Other Visit Diagnoses    Needs flu shot       Relevant Orders   Flu Vaccine QUAD 6+ mos PF IM (Fluarix Quad PF) (Completed)   Total bilirubin, elevated          #Elevated T Bili New finding with hospitalization SBO, had T Bili 1.7 down to 1.6 No prior elevation, previously normal. Prior labs last was 05/2019 Likely secondary to SBO - will check hepatic function panel in 2 months with next lab, follow trend   No orders of the defined types were placed in this encounter.  Orders Placed This Encounter  Procedures  . Flu Vaccine QUAD 6+ mos PF IM (Fluarix Quad PF)  . Ambulatory referral to Gastroenterology    Referral Priority:   Routine    Referral Type:   Consultation    Referral Reason:   Specialty Services Required    Number of Visits Requested:   1     Follow up plan: Return if symptoms worsen or fail to improve, for Non fasting lab 1 week before next apt in December.  Future lab Hepatic function, A1c   Saralyn Pilar, DO [facility] 11/20/2019, 9:39 AM

## 2019-11-20 NOTE — Assessment & Plan Note (Signed)
Resolved. Now having regular BMs but looser, starting today with semi solid Resolved abdominal pain  Encourage improve diet Monitor symptoms Concern with potential cause of this SBO, he has prior surgery but question if adhesion cause or he is worried about other possible cause such as malignancy.  Further work up pending lung nodule with repeat CT. He had CT in 12/2018 with diverticulitis, no other abnormality identified but had pancreas inflammation.  Will recommend GI consultation for evaluation with multiple episodic GI complaints now, determine if need other diagnostic possibly upper endoscopy.

## 2019-11-20 NOTE — Patient Instructions (Addendum)
Thank you for coming to the office today.  Stay tuned for GI consultation.  You saw Dr Allegra Lai back in 11/2017 for colonoscopy  [facility]) [address] - [address] [place], [place] [address] Phone: [phone]  -----------  Upcoming Low Dose CT lung in December.  Next visit with me coming up we can follow-up and check sugar.   Please schedule a Follow-up Appointment to: Return if symptoms worsen or fail to improve, for Non fasting lab 1 week before next apt in December.  If you have any other questions or concerns, please feel free to call the office or send a message through MyChart. You may also schedule an earlier appointment if necessary.  Additionally, you may be receiving a survey about your experience at our office within a few days to 1 week by e-mail or mail. We value your feedback.  Saralyn Pilar, DO [facility], [place]

## 2019-12-17 ENCOUNTER — Other Ambulatory Visit: Payer: Self-pay | Admitting: Family Medicine

## 2019-12-17 DIAGNOSIS — H6982 Other specified disorders of Eustachian tube, left ear: Secondary | ICD-10-CM

## 2019-12-17 DIAGNOSIS — J302 Other seasonal allergic rhinitis: Secondary | ICD-10-CM

## 2020-01-13 ENCOUNTER — Other Ambulatory Visit: Payer: Self-pay | Admitting: *Deleted

## 2020-01-13 DIAGNOSIS — R17 Unspecified jaundice: Secondary | ICD-10-CM

## 2020-01-13 DIAGNOSIS — R7303 Prediabetes: Secondary | ICD-10-CM

## 2020-01-14 ENCOUNTER — Other Ambulatory Visit: Payer: BC Managed Care – PPO

## 2020-01-14 ENCOUNTER — Other Ambulatory Visit: Payer: Self-pay

## 2020-01-15 LAB — HEPATIC FUNCTION PANEL
AG Ratio: 1.9 (calc) (ref 1.0–2.5)
ALT: 27 U/L (ref 9–46)
AST: 21 U/L (ref 10–35)
Albumin: 4.5 g/dL (ref 3.6–5.1)
Alkaline phosphatase (APISO): 79 U/L (ref 35–144)
Bilirubin, Direct: 0.2 mg/dL (ref 0.0–0.2)
Globulin: 2.4 g/dL (calc) (ref 1.9–3.7)
Indirect Bilirubin: 0.5 mg/dL (calc) (ref 0.2–1.2)
Total Bilirubin: 0.7 mg/dL (ref 0.2–1.2)
Total Protein: 6.9 g/dL (ref 6.1–8.1)

## 2020-01-15 LAB — HEMOGLOBIN A1C
Hgb A1c MFr Bld: 5.6 % of total Hgb (ref <5.7)
Mean Plasma Glucose: 114 mg/dL
eAG (mmol/L): 6.3 mmol/L

## 2020-01-19 ENCOUNTER — Ambulatory Visit (INDEPENDENT_AMBULATORY_CARE_PROVIDER_SITE_OTHER): Payer: BC Managed Care – PPO | Admitting: Gastroenterology

## 2020-01-19 ENCOUNTER — Other Ambulatory Visit: Payer: Self-pay

## 2020-01-19 ENCOUNTER — Encounter: Payer: Self-pay | Admitting: Gastroenterology

## 2020-01-19 VITALS — BP 158/81 | HR 80 | Ht 72.0 in | Wt 214.2 lb

## 2020-01-19 DIAGNOSIS — K56609 Unspecified intestinal obstruction, unspecified as to partial versus complete obstruction: Secondary | ICD-10-CM

## 2020-01-19 NOTE — Progress Notes (Signed)
Primary Care Physician: Smitty Cords, DO  Primary Gastroenterologist:  Dr. Midge Minium  Chief Complaint  Patient presents with  . New Patient (Initial Visit)    Possible small bowel obstruction    HPI: Jeremy [name] is a 59 y.o. male here with a report of a hospitalization in October for small bowel obstruction. The patient was followed by surgery who believed that the small bowel obstruction was likely due to adhesions. The patient had a CT scan on October 6 that showed:  IMPRESSION: 1. Findings are suspicious for small bowel obstruction with suspected transition point in the right mid abdominal region, likely due to adhesions. 2. Small amount of free fluid within the right upper quadrant and lower abdomen. No free air. 3. Diffuse diverticular disease of the colon without acute inflammation. 4. 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  The patient has a history of having appendectomy. The small bowel obstruction resolved with conservative treatment of the patient was discharged home.  The patient has followed up with his primary care provider and was referred due to the patient's concerned that the small bowel obstruction may be of another cause besides adhesions such as malignancy.  The patient had a colonoscopy by Dr. Allegra Lai in October 2019. The patient is concerned because from his readings he has found that adhesions and cancers can cause small bowel obstruction.  Past Medical History:  Diagnosis Date  . GERD (gastroesophageal reflux disease)   . Hypertension   . Pre-diabetes     Current Outpatient Medications  Medication Sig Dispense Refill  . amLODipine-benazepril (LOTREL) 5-10 MG capsule TAKE 1 CAPSULE BY MOUTH EVERY DAY 90 capsule 3  . atorvastatin (LIPITOR) 20 MG tablet TAKE 1 TABLET BY MOUTH EVERY DAY (Patient taking differently: Take 20 mg by mouth daily.) 90 tablet 3  . cetirizine (ZYRTEC) 10 MG tablet Take 10 mg by mouth daily.     . clobetasol cream (TEMOVATE) 0.05 % Apply topically.    . fluticasone (FLONASE) 50 MCG/ACT nasal spray PLACE 2 SPRAYS INTO BOTH NOSTRILS DAILY. USE FOR 4-6 WEEKS THEN STOP AND USE SEASONALLY (Patient taking differently: Place 2 sprays into both nostrils daily.) 16 mL 3  . Multiple Vitamin (MULTIVITAMIN) tablet Take 1 tablet by mouth daily. Men's a day    . hydrocortisone (ANUSOL-HC) 25 MG suppository Place 1 suppository (25 mg total) rectally 2 (two) times daily. For 7 days (Patient not taking: Reported on 01/19/2020) 14 suppository 1  . metFORMIN (GLUCOPHAGE) 500 MG tablet TAKE 1 TABLET BY MOUTH EVERY DAY WITH BREAKFAST (Patient not taking: Reported on 01/19/2020) 90 tablet 1  . pantoprazole (PROTONIX) 40 MG tablet Take 1 tablet (40 mg total) by mouth daily as needed. (Patient not taking: Reported on 01/19/2020) 90 tablet 1   No current facility-administered medications for this visit.    Allergies as of 01/19/2020 - Review Complete 01/19/2020  Allergen Reaction Noted  . Cephalexin Nausea And Vomiting 07/13/2014    ROS:  General: Negative for anorexia, weight loss, fever, chills, fatigue, weakness. ENT: Negative for hoarseness, difficulty swallowing , nasal congestion. CV: Negative for chest pain, angina, palpitations, dyspnea on exertion, peripheral edema.  Respiratory: Negative for dyspnea at rest, dyspnea on exertion, cough, sputum, wheezing.  GI: See history of present illness. GU:  Negative for dysuria, hematuria, urinary incontinence, urinary frequency, nocturnal urination.  Endo: Negative for unusual weight change.    Physical Examination:   BP (!) 158/81   Pulse  80   Ht 6' (1.829 m)   Wt 214 lb 3.2 oz (97.2 kg)   BMI 29.05 kg/m   General: Well-nourished, well-developed in no acute distress.  Eyes: No icterus. Conjunctivae pink. Lungs: Clear to auscultation bilaterally. Non-labored. Heart: Regular rate and rhythm, no murmurs rubs or gallops.  Abdomen: Bowel sounds  are normal, nontender, nondistended, no hepatosplenomegaly or masses, no abdominal bruits or hernia , no rebound or guarding.   Extremities: No lower extremity edema. No clubbing or deformities. Neuro: Alert and oriented x 3.  Grossly intact. Skin: Warm and dry, no jaundice.   Psych: Alert and cooperative, normal mood and affect.  Labs:    Imaging Studies: No results found.  Assessment and Plan:   Jeremy [name] is a 59 y.o. y/o male who comes in after being in the hospital with a small bowel obstruction.  The patient was treated conservatively and the obstruction resolved. The patient is concerned that malignancy may be a differential diagnosis for small bowel obstruction.  The patient has been reassured that if malignancy was the cause it would not have resolved so quickly.  The patient will be set up for a small bowel follow-through to look for any narrowing strictures or indications of the cause for his small bowel obstruction.  The patient has been explained the plan and agrees with it.     Midge Minium, MD. Clementeen Graham    Note: This dictation was prepared with Dragon dictation along with smaller phrase technology. Any transcriptional errors that result from this process are unintentional.

## 2020-01-21 ENCOUNTER — Ambulatory Visit (INDEPENDENT_AMBULATORY_CARE_PROVIDER_SITE_OTHER): Payer: BC Managed Care – PPO | Admitting: Family Medicine

## 2020-01-21 ENCOUNTER — Other Ambulatory Visit: Payer: Self-pay | Admitting: Family Medicine

## 2020-01-21 ENCOUNTER — Encounter: Payer: Self-pay | Admitting: Family Medicine

## 2020-01-21 ENCOUNTER — Other Ambulatory Visit: Payer: Self-pay

## 2020-01-21 VITALS — BP 124/86 | HR 77 | Temp 97.6°F | Resp 16 | Ht 72.0 in | Wt 216.0 lb

## 2020-01-21 DIAGNOSIS — I10 Essential (primary) hypertension: Secondary | ICD-10-CM | POA: Diagnosis not present

## 2020-01-21 DIAGNOSIS — Z125 Encounter for screening for malignant neoplasm of prostate: Secondary | ICD-10-CM

## 2020-01-21 DIAGNOSIS — Z8719 Personal history of other diseases of the digestive system: Secondary | ICD-10-CM

## 2020-01-21 DIAGNOSIS — R7303 Prediabetes: Secondary | ICD-10-CM

## 2020-01-21 DIAGNOSIS — Z0000 Encounter for general adult medical examination without abnormal findings: Secondary | ICD-10-CM

## 2020-01-21 DIAGNOSIS — R911 Solitary pulmonary nodule: Secondary | ICD-10-CM

## 2020-01-21 DIAGNOSIS — E782 Mixed hyperlipidemia: Secondary | ICD-10-CM

## 2020-01-21 NOTE — Assessment & Plan Note (Signed)
Well-controlled Pre-DM with A1c improved to 5.6 Concern with HTN, HLD/TG  Plan:  1. Continue current Metformin IR 500mg  daily 2. Encourage improved lifestyle - low carb, low sugar diet, reduce portion size, continue improving regular exercise - has shown significant improvement in lifestyle

## 2020-01-21 NOTE — Progress Notes (Signed)
Subjective:    Patient ID: Jeremy [name], male    DOB: [birth year], 59 y.o.   MRN: [id number]  Jeremy [name] is a 59 y.o. male presenting on 01/21/2020 for Pre-Diabetes   HPI   Pre-Diabetes/ Overweight BMI >28 He has had range of 5.5 to 6.0 Last lab 01/14/20 - A1c 5.6 improved now He is doing well overall with lifestyle. CBGs: none lately Meds:Metformin IR 500mg  daily - tolerating well Currently on ACEi Lifestyle: - Weight up 9 lbs Diet -Stable diet, mediterranean heart healthy, whole grains, no red meat , mostly water, no sodas, occasional beer. No salt sodium. Exercise -increased walking Denies hypoglycemia  CHRONIC HTN: Reports recent at dentist and other doctors, electronic cuff has been mild elevated lately, reported up to 190/90 at one time wrist electronic cuff. Checks home BP readings 120-140s range in past, none he has taken recently Current Meds -Amlodipine-Benazepril 5-10mg  daily Reports good compliance, took meds today. Tolerating well, w/o complaints Denies CP, dyspnea, HA, edema, dizziness / lightheadedness  History SBO T Bilirubin - resolved Followed by Dr AGI Previously mild elevated T Bili during hospitalization, now has normalized Has upcoming Barium swallow to evaluate recent SBO  Follow-up Pulm Nodule, RLL Last identified on LDCT 10/2019, Next repeat CT is upcoming within next 1-2 weeks, it is a 3 month repeat image, followed by [facility].    Depression screen [facility] 2/9 12/12/2018 10/01/2018 07/19/2018  Decreased Interest 0 0 0  Down, Depressed, Hopeless 0 0 0  PHQ - 2 Score 0 0 0  Altered sleeping 0 - -  Tired, decreased energy 0 - -  Change in appetite 0 - -  Feeling bad or failure about yourself  0 - -  Trouble concentrating 0 - -  Moving slowly or fidgety/restless 0 - -  Suicidal thoughts 0 - -  PHQ-9 Score 0 - -    Social History   Tobacco Use  . Smoking status: Former Smoker    Packs/day: 1.00    Years: 35.00     Pack years: 35.00    Types: Cigarettes    Quit date: 01/06/2014    Years since quitting: 6.0  . Smokeless tobacco: Former 14/02/2013  . Vaping Use: Never used  Substance Use Topics  . Alcohol use: Yes    Alcohol/week: 6.0 standard drinks    Types: 6 Cans of beer per week  . Drug use: No    Review of Systems Per HPI unless specifically indicated above     Objective:    BP 124/86   Pulse 77   Temp 97.6 F (36.4 C) (Temporal)   Resp 16   Ht 6' (1.829 m)   Wt 216 lb (98 kg)   SpO2 98%   BMI 29.29 kg/m   Wt Readings from Last 3 Encounters:  01/21/20 216 lb (98 kg)  01/19/20 214 lb 3.2 oz (97.2 kg)  11/20/19 207 lb 9.6 oz (94.2 kg)    Physical Exam Vitals and nursing note reviewed.  Constitutional:      General: He is not in acute distress.    Appearance: He is well-developed and well-nourished. He is not diaphoretic.     Comments: Well-appearing, comfortable, cooperative  HENT:     Head: Normocephalic and atraumatic.     Mouth/Throat:     Mouth: Oropharynx is clear and moist.  Eyes:     General:        Right eye: No discharge.  Left eye: No discharge.     Conjunctiva/sclera: Conjunctivae normal.  Cardiovascular:     Rate and Rhythm: Normal rate.  Pulmonary:     Effort: Pulmonary effort is normal.  Musculoskeletal:        General: No edema.  Skin:    General: Skin is warm and dry.     Findings: No erythema or rash.  Neurological:     Mental Status: He is alert and oriented to person, place, and time.  Psychiatric:        Mood and Affect: Mood and affect normal.        Behavior: Behavior normal.     Comments: Well groomed, good eye contact, normal speech and thoughts       Results for orders placed or performed in visit on 01/13/20  Hemoglobin A1c  Result Value Ref Range   Hgb A1c MFr Bld 5.6 <5.7 % of total Hgb   Mean Plasma Glucose 114 mg/dL   eAG (mmol/L) 6.3 mmol/L  Hepatic function panel  Result Value Ref Range   Total Protein 6.9  6.1 - 8.1 g/dL   Albumin 4.5 3.6 - 5.1 g/dL   Globulin 2.4 1.9 - 3.7 g/dL (calc)   AG Ratio 1.9 1.0 - 2.5 (calc)   Total Bilirubin 0.7 0.2 - 1.2 mg/dL   Bilirubin, Direct 0.2 0.0 - 0.2 mg/dL   Indirect Bilirubin 0.5 0.2 - 1.2 mg/dL (calc)   Alkaline phosphatase (APISO) 79 35 - 144 U/L   AST 21 10 - 35 U/L   ALT 27 9 - 46 U/L      Assessment & Plan:   Problem List Items Addressed This Visit    Prediabetes - Primary    Well-controlled Pre-DM with A1c improved to 5.6 Concern with HTN, HLD/TG  Plan:  1. Continue current Metformin IR 500mg  daily 2. Encourage improved lifestyle - low carb, low sugar diet, reduce portion size, continue improving regular exercise - has shown significant improvement in lifestyle      Essential (primary) hypertension    Normal BP controlled, some history of elevated BP with electronic cuffs at other offices - Home BP readings normal  No known complications     Plan:  1. Continue current BP regimen - Amlodipine-Benazepril 5-10mg  daily 2. Encourage improved lifestyle - low sodium diet, regular exercise 3. Continue monitor BP outside office, bring readings to next visit, if persistently >140/90 or new symptoms notify office sooner       Other Visit Diagnoses    History of small bowel obstruction       Right lower lobe pulmonary nodule          #History SBO Followed by AGI Dr Upcoming barium swallow, eval for possible cause of this episode Resolved T Bili, normalized on lab.  #RLL pulm nodule Identified on LDCT 10/2019, now has repeat 01/2020, with [facility], as scheduled  No orders of the defined types were placed in this encounter.     Follow up plan: Return in about 6 months (around 07/21/2020) for 6 month fasting lab only then 1 week later Annual Physical.  Future labs ordered for 07/2020   08/2020, DO [facility] 01/21/2020, 8:18 AM

## 2020-01-21 NOTE — Patient Instructions (Addendum)
Thank you for coming to the office today.  Keep a close watch on your BP. If persistent elevated >140/90 then we can reconsider dose adjust on medication and re-evaluation.  Follow with Dr Servando Snare for Barium swallow and testing  Upcoming CT for pulm nodule follow-up  Reassurance on Bilirubin resolved to normal.  Recent Labs    06/06/19 0805 11/12/19 1459 01/14/20 0933  HGBA1C 5.8* 5.8* 5.6   DUE for FASTING BLOOD WORK (no food or drink after midnight before the lab appointment, only water or coffee without cream/sugar on the morning of)  SCHEDULE "Lab Only" visit in the morning at the clinic for lab draw in 6 MONTHS   - Make sure Lab Only appointment is at about 1 week before your next appointment, so that results will be available  For Lab Results, once available within 2-3 days of blood draw, you can can log in to MyChart online to view your results and a brief explanation. Also, we can discuss results at next follow-up visit.   Please schedule a Follow-up Appointment to: Return in about 6 months (around 07/21/2020) for 6 month fasting lab only then 1 week later Annual Physical.  If you have any other questions or concerns, please feel free to call the office or send a message through MyChart. You may also schedule an earlier appointment if necessary.  Additionally, you may be receiving a survey about your experience at our office within a few days to 1 week by e-mail or mail. We value your feedback.  Saralyn Pilar, DO [facility], [place]

## 2020-01-21 NOTE — Assessment & Plan Note (Signed)
Normal BP controlled, some history of elevated BP with electronic cuffs at other offices - Home BP readings normal  No known complications     Plan:  1. Continue current BP regimen - Amlodipine-Benazepril 5-10mg  daily 2. Encourage improved lifestyle - low sodium diet, regular exercise 3. Continue monitor BP outside office, bring readings to next visit, if persistently >140/90 or new symptoms notify office sooner

## 2020-01-23 ENCOUNTER — Ambulatory Visit
Admission: RE | Admit: 2020-01-23 | Discharge: 2020-01-23 | Disposition: A | Discharge status: Home / Self Care | Payer: BC Managed Care – PPO | Source: Ambulatory Visit | Attending: Gastroenterology | Admitting: Gastroenterology

## 2020-01-23 ENCOUNTER — Other Ambulatory Visit: Payer: Self-pay

## 2020-01-23 DIAGNOSIS — K56609 Unspecified intestinal obstruction, unspecified as to partial versus complete obstruction: Secondary | ICD-10-CM | POA: Diagnosis not present

## 2020-01-26 ENCOUNTER — Telehealth: Payer: Self-pay

## 2020-01-26 NOTE — Telephone Encounter (Signed)
-----   Message from Midge Minium, MD sent at 01/25/2020  9:15 AM EST ----- With the patient know that the small bowel follow through did not show any narrowing or obstruction and it was completely normal.

## 2020-01-26 NOTE — Telephone Encounter (Signed)
Pt notified of small bowel follow through results via mychart.

## 2020-01-28 ENCOUNTER — Ambulatory Visit
Admission: RE | Admit: 2020-01-28 | Discharge: 2020-01-28 | Disposition: A | Discharge status: Home / Self Care | Payer: BC Managed Care – PPO | Source: Ambulatory Visit | Attending: Oncology | Admitting: Oncology

## 2020-01-28 ENCOUNTER — Other Ambulatory Visit: Payer: Self-pay

## 2020-01-28 DIAGNOSIS — Z87891 Personal history of nicotine dependence: Secondary | ICD-10-CM | POA: Diagnosis present

## 2020-01-28 DIAGNOSIS — R918 Other nonspecific abnormal finding of lung field: Secondary | ICD-10-CM | POA: Diagnosis present

## 2020-02-02 ENCOUNTER — Encounter: Payer: Self-pay | Admitting: *Deleted

## 2020-02-05 ENCOUNTER — Other Ambulatory Visit: Payer: Self-pay | Admitting: Family Medicine

## 2020-02-05 DIAGNOSIS — I10 Essential (primary) hypertension: Secondary | ICD-10-CM

## 2020-02-05 DIAGNOSIS — E785 Hyperlipidemia, unspecified: Secondary | ICD-10-CM

## 2020-02-12 DIAGNOSIS — I10 Essential (primary) hypertension: Secondary | ICD-10-CM

## 2020-02-12 MED ORDER — AMLODIPINE BESY-BENAZEPRIL HCL 5-10 MG PO CAPS: 1.0000 | ORAL_CAPSULE | Freq: Every day | ORAL | 0 refills | Status: DC

## 2020-03-02 ENCOUNTER — Other Ambulatory Visit: Payer: Self-pay | Admitting: Family Medicine

## 2020-03-02 DIAGNOSIS — H6982 Other specified disorders of Eustachian tube, left ear: Secondary | ICD-10-CM

## 2020-03-02 DIAGNOSIS — J302 Other seasonal allergic rhinitis: Secondary | ICD-10-CM

## 2020-03-02 NOTE — Telephone Encounter (Signed)
Requested Prescriptions  Pending Prescriptions Disp Refills  . fluticasone (FLONASE) 50 MCG/ACT nasal spray [Pharmacy Med Name: FLUTICASONE PROP 50 MCG SPRAY] 16 mL 1    Sig: PLACE 2 SPRAYS INTO BOTH NOSTRILS DAILY. USE FOR 4-6 WEEKS THEN STOP AND USE SEASONALLY     Ear, Nose, and Throat: Nasal Preparations - Corticosteroids Passed - 03/02/2020 12:31 PM      Passed - Valid encounter within last 12 months    Recent Outpatient Visits          1 month ago Prediabetes   [facility] [place], Netta Neat, DO   3 months ago SBO (small bowel obstruction) [facility])   [facility] Smitty Cords, DO   7 months ago Annual physical exam   [facility] Smitty Cords, DO   1 year ago Diverticulitis   [facility] Smitty Cords, DO   1 year ago Hordeolum internum right lower eyelid   [facility] [place], Netta Neat, DO

## 2020-03-08 ENCOUNTER — Other Ambulatory Visit: Payer: BC Managed Care – PPO

## 2020-03-08 ENCOUNTER — Other Ambulatory Visit: Payer: Self-pay

## 2020-03-08 DIAGNOSIS — Z20822 Contact with and (suspected) exposure to covid-19: Secondary | ICD-10-CM

## 2020-03-09 LAB — NOVEL CORONAVIRUS, NAA: SARS-CoV-2, NAA: NOT DETECTED

## 2020-03-09 LAB — SARS-COV-2, NAA 2 DAY TAT

## 2020-03-25 ENCOUNTER — Encounter: Payer: Self-pay | Admitting: Family Medicine

## 2020-03-25 ENCOUNTER — Ambulatory Visit: Payer: Self-pay | Admitting: *Deleted

## 2020-03-25 ENCOUNTER — Telehealth (INDEPENDENT_AMBULATORY_CARE_PROVIDER_SITE_OTHER): Payer: BC Managed Care – PPO | Admitting: Family Medicine

## 2020-03-25 ENCOUNTER — Other Ambulatory Visit: Payer: Self-pay

## 2020-03-25 VITALS — Wt 216.0 lb

## 2020-03-25 DIAGNOSIS — H01001 Unspecified blepharitis right upper eyelid: Secondary | ICD-10-CM

## 2020-03-25 MED ORDER — AMOXICILLIN-POT CLAVULANATE 875-125 MG PO TABS: 1.0000 | ORAL_TABLET | Freq: Two times a day (BID) | ORAL | 0 refills | Status: DC

## 2020-03-25 MED ORDER — POLYMYXIN B-TRIMETHOPRIM 10000-0.1 UNIT/ML-% OP SOLN: 1.0000 [drp] | Freq: Four times a day (QID) | OPHTHALMIC | 0 refills | Status: DC

## 2020-03-25 NOTE — Progress Notes (Signed)
Subjective:    Patient ID: Jeremy [name], male    DOB: 09/11/1960, 60 y.o.   MRN: [id number]  Jeremy [name] is a 60 y.o. male presenting on 03/25/2020 for Belepharitis (Right eye)  Patient presents for a same day appointment.  Virtual / Telehealth Encounter - Video Visit via MyChart The purpose of this virtual visit is to provide medical care while limiting exposure to the novel coronavirus (COVID19) for both patient and office staff.  Consent was obtained for remote visit:  Yes.   Answered questions that patient had about telehealth interaction:  Yes.   I discussed the limitations, risks, security and privacy concerns of performing an evaluation and management service by video/telephone. I also discussed with the patient that there may be a patient responsible charge related to this service. The patient expressed understanding and agreed to proceed.  Patient Location: Home Provider Location: [facility] (Office)  Participants in virtual visit: - Patient: Jeremy [name] - CMA: Randa Lynn, CMA - Provider: Dr Althea Charon   HPI   Right Upper Eyelid Blepheritis New problem, onset past 2-3 days with worsening R upper eyelid redness swelling discomfort No drainage from eye No loss of vision Past history of R lower eyelid stye Tried warm compress limited relief Denies any eye pain   Depression screen [facility] 2/9 12/12/2018 10/01/2018 07/19/2018  Decreased Interest 0 0 0  Down, Depressed, Hopeless 0 0 0  PHQ - 2 Score 0 0 0  Altered sleeping 0 - -  Tired, decreased energy 0 - -  Change in appetite 0 - -  Feeling bad or failure about yourself  0 - -  Trouble concentrating 0 - -  Moving slowly or fidgety/restless 0 - -  Suicidal thoughts 0 - -  PHQ-9 Score 0 - -    Social History   Tobacco Use  . Smoking status: Former Smoker    Packs/day: 1.00    Years: 35.00    Pack years: 35.00    Types: Cigarettes    Quit date: 01/06/2014    Years since  quitting: 6.2  . Smokeless tobacco: Former Clinical biochemist  . Vaping Use: Never used  Substance Use Topics  . Alcohol use: Yes    Alcohol/week: 6.0 standard drinks    Types: 6 Cans of [name] per week  . Drug use: No    Review of Systems Per HPI unless specifically indicated above     Objective:    Wt 216 lb (98 kg)   BMI 29.29 kg/m   Wt Readings from Last 3 Encounters:  03/25/20 216 lb (98 kg)  01/21/20 216 lb (98 kg)  01/19/20 214 lb 3.2 oz (97.2 kg)    Physical Exam   Note examination was completely remotely via video observation objective data only  Gen - well-appearing, no acute distress or apparent pain, comfortable HEENT - Right upper eyelid with swelling and slight redness, no drainage, sclera are clear Heart/Lungs - cannot examine virtually - observed no evidence of coughing or labored breathing. Face - no spreading rash or erythema Neuro - awake, alert, oriented Psych - not anxious appearing  Results for orders placed or performed in visit on 03/08/20  Novel Coronavirus, NAA ([facility])   Specimen: Nasopharyngeal(NP) swabs in vial transport medium   Nasopharynge  Result Value Ref Range   SARS-CoV-2, NAA Not Detected Not Detected  SARS-COV-2, NAA 2 DAY TAT   Nasopharynge  Result Value Ref Range   SARS-CoV-2, NAA  2 DAY TAT Performed       Assessment & Plan:   Problem List Items Addressed This Visit   None   Visit Diagnoses    Blepharitis of right upper eyelid, unspecified type    -  Primary   Relevant Medications   amoxicillin-clavulanate (AUGMENTIN) 875-125 MG tablet   trimethoprim-polymyxin b (POLYTRIM) ophthalmic solution      Clinically with R upper eyelid blepharitis Possibly infectious or bacterial etiology No obvious conjunctivitis seen on video today Prior stye or internal hordeolum R lower lid in past 2020  Trial on oral antibiotic to help limit any extension of pre-orbital cellulitis possibility, but again not consistent on exam Add  Polytrim ophth eye drop 1 q 6 hr up to 7-10 days Add cool compress F/u return criteria given  Meds ordered this encounter  Medications  . amoxicillin-clavulanate (AUGMENTIN) 875-125 MG tablet    Sig: Take 1 tablet by mouth 2 (two) times daily. For 7 days    Dispense:  14 tablet    Refill:  0  . trimethoprim-polymyxin b (POLYTRIM) ophthalmic solution    Sig: Place 1 drop into the right eye every 6 (six) hours. For up to 7-10 days.    Dispense:  10 mL    Refill:  0      Follow up plan: Return in about 1 week (around 04/01/2020), or if symptoms worsen or fail to improve, for blepharitis if not improved.   Patient verbalizes understanding with the above medical recommendations including the limitation of remote medical advice.  Specific follow-up and call-back criteria were given for patient to follow-up or seek medical care more urgently if needed.  Total duration of direct patient care provided via video conference: 10 minutes   Saralyn Pilar, DO [facility] 03/25/2020, 11:22 AM

## 2020-03-25 NOTE — Telephone Encounter (Signed)
Pt has been added on the schedule today, virtually.

## 2020-03-25 NOTE — Telephone Encounter (Signed)
I returned pt's call.   He is c/o a red, swollen right upper eyelid that started 3 days ago.   He has been putting heat on it for the last couple of days 20 minutes on, 20 minutes off to no avail. There is not a bump or a head on the eyelid to bring to a head like a sty.   No drainage from the eye.    There are no appts at [facility].   The agent he spoke to first has sent a message (CRM) to the office to see if they can work him in.   He was agreeable to this when I mentioned it to him.  I let him know someone will be in contact with him.  Reason for Disposition . Eyelid is red and painful (or tender to touch)  Answer Assessment - Initial Assessment Questions 1. ONSET: "When did the swelling start?" (e.g., minutes, hours, days)     Started 3 days on.  I'm putting warm compresses on it.  It's continuing to swell worse.   It's not a sty. 2. LOCATION: "What part of the eyelids is swollen?"     Right upper eyelid.   3. SEVERITY: "How swollen is it?"     Lid and above the lid.   My eye is fine.   The eyelid is red and puffy.   4. ITCHING: "Is there any itching?" If Yes, ask: "How much?"   (Scale 1-10; mild, moderate or severe)     No 5. PAIN: "Is the swelling painful to touch?" If Yes, ask: "How painful is it?"   (Scale 1-10; mild, moderate or severe)     It hurts to touch it but not otherwise. 6. FEVER: "Do you have a fever?" If Yes, ask: "What is it, how was it measured, and when did it start?"      No fever or chills or body aches 7. CAUSE: "What do you think is causing the swelling?"     I thought it was a sty.   I've had them a couple of times during my 59 years of life.   I'm using heat on the eyelid for the last couple of days 20 minutes on 20 minutes off.   It's not helping.   It's actually a little more swollen today.   The upper eye lid and into the upper crease of my eye.   My eye itself is fine.   Denies drainage or redness in the eye. 8. RECURRENT SYMPTOM: "Have you  had eyelid swelling before?" If Yes, ask: "When was the last time?" "What happened that time?"     A couple of times a very long time ago I've had a sty or two. 9. OTHER SYMPTOMS: "Do you have any other symptoms?" (e.g., blurred vision, eye discharge, rash, runny nose)     No 10. PREGNANCY: "Is there any chance you are pregnant?" "When was your last menstrual period?"       N/A  Protocols used: [facility]

## 2020-03-25 NOTE — Patient Instructions (Addendum)
Blepharitis Blepharitis is swelling of the eyelids. Symptoms may include:  Reddish, scaly skin around the scalp and eyebrows.  Burning or itching of the eyelids.  Fluid coming from the eye at night. This causes the eyelashes to stick together in the morning.  Eyelashes that fall out.  Being sensitive to light. Follow these instructions at home: Pay attention to any changes in how you look or feel. Tell your health care provider about any changes. Follow these instructions to help with your condition: Keeping clean  Wash your hands often.  Wash your eyelids with warm water, or wash them with warm water that is mixed with little bit of baby shampoo. Do this 2 or more times per day.  Wash your face and eyebrows at least once a day.  Use a clean towel each time you dry your eyelids. Do not use the towel to clean or dry other areas of your body. Do not share your towel with anyone.   General instructions  Avoid wearing makeup until you get better. Do not share makeup with anyone.  Avoid rubbing your eyes.  Put a warm compress on your eyes 2 times per day for 10 minutes at a time, or as told by your doctor.  If you were given antibiotics in the form of creams or eye drops, use the medicine as told by your doctor. Do not stop using the medicine even if you feel better.  Keep all follow-up visits as told by your doctor. This is important. Contact a doctor if:  Your eyelids feel hot.  You have blisters on your eyelids.  You have a rash on your eyelids.  The swelling does not go away in 2-4 days.  The swelling gets worse. Get help right away if:  You have pain that gets worse.  You have pain that spreads to other parts of your face.  You have redness that gets worse.  You have redness that spreads to other parts of your face.  Your vision changes.  You have pain when you look at lights or things that move.  You have a fever. Summary  Blepharitis is swelling of the  eyelids.  Pay attention to any changes in how your eyes look or feel. Tell your doctor about any changes.  Follow home care instructions as told by your doctor. Wash your hands often. Avoid wearing makeup. Do not rub your eyes.  Use warm compresses, creams, or eye drops as told by your doctor.  Let your doctor know if you have changes in vision, blisters or rash on eyelids, pain that spreads to your face, or warmth on your eyelids. This information is not intended to replace advice given to you by your health care provider. Make sure you discuss any questions you have with your health care provider. Document Revised: 07/23/2017 Document Reviewed: 07/23/2017 Elsevier Patient Education  2021 [employer]vier Inc[employer]   Please schedule a Follow-up Appointment to: Return in about 1 week (around 04/01/2020), or if symptoms worsen or fail to improve, for blepharitis if not improved.  If you have any other questions or concerns, please feel free to call the office or send a message through MyChart. You may also schedule an earlier appointment if necessary.  Additionally, you may be receiving a survey about your experience at our office within a few days to 1 week by e-mail or mail. We value your feedback.  Saralyn Pilar, DO [facility], [place]

## 2020-05-02 ENCOUNTER — Other Ambulatory Visit: Payer: Self-pay | Admitting: Family Medicine

## 2020-05-02 DIAGNOSIS — R7303 Prediabetes: Secondary | ICD-10-CM

## 2020-05-02 NOTE — Telephone Encounter (Signed)
No future visit scheduled. Approved per protocol.

## 2020-07-31 ENCOUNTER — Other Ambulatory Visit: Payer: Self-pay | Admitting: Family Medicine

## 2020-07-31 DIAGNOSIS — I10 Essential (primary) hypertension: Secondary | ICD-10-CM

## 2020-07-31 DIAGNOSIS — R7303 Prediabetes: Secondary | ICD-10-CM

## 2020-07-31 DIAGNOSIS — E785 Hyperlipidemia, unspecified: Secondary | ICD-10-CM

## 2020-07-31 NOTE — Telephone Encounter (Signed)
Requested medication (s) are due for refill today: yes  Requested medication (s) are on the active medication list: yes  Last refill:  atorvastatin: 02/05/20 #90 1 RF          amlodipine-benazepril: 02/12/20 #90              Metformin: 05/02/20  Future visit scheduled: no  Notes to clinic:  overdue lab work   Requested Prescriptions  Pending Prescriptions Disp Refills   atorvastatin (LIPITOR) 20 MG tablet [Pharmacy Med Name: ATORVASTATIN 20 MG TABLET] 90 tablet 1    Sig: TAKE 1 TABLET BY MOUTH EVERY DAY      Cardiovascular:  Antilipid - Statins Failed - 07/31/2020  9:02 AM      Failed - Total Cholesterol in normal range and within 360 days    Cholesterol, Total  Date Value Ref Range Status  01/07/2015 216 (H) 100 - 199 mg/dL Final   Cholesterol  Date Value Ref Range Status  06/06/2019 166 <200 mg/dL Final          Failed - LDL in normal range and within 360 days    LDL Cholesterol (Calc)  Date Value Ref Range Status  06/06/2019 85 mg/dL (calc) Final    Comment:    Reference range: <100 . Desirable range <100 mg/dL for primary prevention;   <70 mg/dL for patients with CHD or diabetic patients  with > or = 2 CHD risk factors. Marland Kitchen LDL-C is now calculated using the Martin-Hopkins  calculation, which is a validated novel method providing  better accuracy than the Friedewald equation in the  estimation of LDL-C.  Cresenciano Genre et al. Annamaria Helling. 1031;594(58): 2061-2068  (http://education.[facility].com/faq/FAQ164)           Failed - HDL in normal range and within 360 days    HDL  Date Value Ref Range Status  06/06/2019 66 > OR = 40 mg/dL Final  01/07/2015 58 >39 mg/dL Final          Failed - Triglycerides in normal range and within 360 days    Triglycerides  Date Value Ref Range Status  06/06/2019 63 <150 mg/dL Final          Passed - Patient is not pregnant      Passed - Valid encounter within last 12 months    Recent Outpatient Visits           4 months ago  Blepharitis of right upper eyelid, unspecified type   [place], DO   6 months ago Prediabetes   [facility] [place], Devonne Doughty, DO   8 months ago SBO (small bowel obstruction) [facility])   [facility] Olin Hauser, DO   1 year ago Annual physical exam   [facility] Olin Hauser[facility] DO   1 year ago Diverticulitis   [place], DO                  amLODipine-benazepril (LOTREL) 5-10 MG capsule [Pharmacy Med Name: AMLODIPINE-BENAZEPRIL 5-10 MG] 90 capsule 1    Sig: TAKE 1 CAPSULE BY MOUTH EVERY DAY      Cardiovascular: CCB + ACEI Combos Failed - 07/31/2020  9:02 AM      Failed - Cr in normal range and within 180 days    Creat  Date Value Ref Range Status  06/06/2019 0.67 (L) 0.70 - 1.33 mg/dL Final    Comment:  For patients >54 years of age, the reference limit for Creatinine is approximately 13% higher for people identified as African-American. .    Creatinine, Ser  Date Value Ref Range Status  11/15/2019 0.80 0.61 - 1.24 mg/dL Final          Failed - K in normal range and within 180 days    Potassium  Date Value Ref Range Status  11/15/2019 4.8 3.5 - 5.1 mmol/L Final          Passed - Patient is not pregnant      Passed - Last BP in normal range    BP Readings from Last 1 Encounters:  01/21/20 124/86          Passed - Valid encounter within last 6 months    Recent Outpatient Visits           4 months ago Blepharitis of right upper eyelid, unspecified type   [place], DO   6 months ago Prediabetes   [facility] [place], Devonne Doughty, DO   8 months ago SBO (small bowel obstruction) [facility])   [place], DO   1 year ago Annual physical exam   [facility] Olin Hauser, DO    1 year ago Diverticulitis   [facility] [place], Devonne Doughty, DO                  metFORMIN (GLUCOPHAGE) 500 MG tablet [Pharmacy Med Name: METFORMIN HCL 500 MG TABLET] 90 tablet 0    Sig: TAKE 1 TABLET BY [place]      Endocrinology:  Diabetes - Biguanides Failed - 07/31/2020  9:02 AM      Failed - HBA1C is between 0 and 7.9 and within 180 days    Hgb A1c MFr Bld  Date Value Ref Range Status  01/14/2020 5.6 <5.7 % of total Hgb Final    Comment:    For the purpose of screening for the presence of diabetes: . <5.7%       Consistent with the absence of diabetes 5.7-6.4%    Consistent with increased risk for diabetes             (prediabetes) > or =6.5%  Consistent with diabetes . This assay result is consistent with a decreased risk of diabetes. . Currently, no consensus exists regarding use of hemoglobin A1c for diagnosis of diabetes in children. . According to American Diabetes Association (ADA) guidelines, hemoglobin A1c <7.0% represents optimal control in non-pregnant diabetic patients. Different metrics may apply to specific patient populations.  Standards of Medical Care in Diabetes(ADA). .           Passed - Cr in normal range and within 360 days    Creat  Date Value Ref Range Status  06/06/2019 0.67 (L) 0.70 - 1.33 mg/dL Final    Comment:    For patients >54 years of age, the reference limit for Creatinine is approximately 13% higher for people identified as African-American. .    Creatinine, Ser  Date Value Ref Range Status  11/15/2019 0.80 0.61 - 1.24 mg/dL Final          Passed - eGFR in normal range and within 360 days    GFR, Est African American  Date Value Ref Range Status  06/06/2019 122 > OR = 60 mL/min/1.1m Final   GFR, Est Non African American  Date  Value Ref Range Status  06/06/2019 105 > OR = 60 mL/min/1.82m Final   GFR, Estimated  Date Value Ref Range Status  11/15/2019 >60 >60  mL/min Final          Passed - Valid encounter within last 6 months    Recent Outpatient Visits           4 months ago Blepharitis of right upper eyelid, unspecified type   [place]   6 months ago Prediabetes   [place]   8 months ago [facility]BO (small bowel obstruction[facility]   [facility]   1 year ago Annual physical exam   [place]   1 year ago Diverticulitis   [place]

## 2020-08-05 ENCOUNTER — Other Ambulatory Visit: Payer: Self-pay | Admitting: Family Medicine

## 2020-08-05 DIAGNOSIS — K219 Gastro-esophageal reflux disease without esophagitis: Secondary | ICD-10-CM

## 2020-08-05 NOTE — Telephone Encounter (Signed)
Requested medication (s) are due for refill today: expired medication/ historical med  Requested medication (s) are on the active medication list: yes  Last refill:  07/22/19 #90 1 refill  Future visit scheduled: no  Notes to clinic:  expired medication,, historical medication     Requested Prescriptions  Pending Prescriptions Disp Refills   pantoprazole (PROTONIX) 40 MG tablet [Pharmacy Med Name: PANTOPRAZOLE SOD DR 40 MG TAB] 90 tablet 1    Sig: TAKE 1 TABLET BY MOUTH EVERY DAY BEFORE BREAKFAST      Gastroenterology: Proton Pump Inhibitors Passed - 08/05/2020  5:12 PM      Passed - Valid encounter within last 12 months    Recent Outpatient Visits           4 months ago Blepharitis of right upper eyelid, unspecified type   [facility] [place], Netta Neat, DO   6 months ago Prediabetes   [facility] [place], Netta Neat, DO   8 months ago SBO (small bowel obstruction) [facility])   [facility] [place] [facility]ords, DO   1 year ago Annual physical exam   [facility] Smitty Cords, DO   1 year ago Diverticulitis   [facility] [place], Netta Neat, DO

## 2021-01-26 ENCOUNTER — Other Ambulatory Visit: Payer: Self-pay | Admitting: Family Medicine

## 2021-01-26 DIAGNOSIS — R7303 Prediabetes: Secondary | ICD-10-CM

## 2021-01-26 DIAGNOSIS — E785 Hyperlipidemia, unspecified: Secondary | ICD-10-CM

## 2021-01-26 DIAGNOSIS — I10 Essential (primary) hypertension: Secondary | ICD-10-CM

## 2021-01-26 NOTE — Telephone Encounter (Signed)
Requested medications are due for refill today.  yes  Requested medications are on the active medications list.  yes  Last refill. 08/02/2020 for all 3   Future visit scheduled.   no  Notes to clinic.  Pt last seen 01/21/2020. Failed protocol d/t expired labs.    Requested Prescriptions  Pending Prescriptions Disp Refills   amLODipine-benazepril (LOTREL) 5-10 MG capsule [Pharmacy Med Name: AMLODIPINE-BENAZEPRIL 5-10 MG] 90 capsule 1    Sig: TAKE 1 CAPSULE BY MOUTH EVERY DAY     Cardiovascular: CCB + ACEI Combos Failed - 01/26/2021  1:11 AM      Failed - Cr in normal range and within 180 days    Creat  Date Value Ref Range Status  06/06/2019 0.67 (L) 0.70 - 1.33 mg/dL Final    Comment:    For patients >61 years of age, the reference limit for Creatinine is approximately 13% higher for people identified as African-American. .    Creatinine, Ser  Date Value Ref Range Status  11/15/2019 0.80 0.61 - 1.24 mg/dL Final          Failed - K in normal range and within 180 days    Potassium  Date Value Ref Range Status  11/15/2019 4.8 3.5 - 5.1 mmol/L Final          Failed - Valid encounter within last 6 months    Recent Outpatient Visits           10 months ago Blepharitis of right upper eyelid, unspecified type   [facility], Devonne Doughty, DO   1 year ago Prediabetes   [place], DO   1 year ago SBO (small bowel obstruction) [facility])   [facility] Olin Hauser, DO   1 year ago Annual physical exam   [facility] Olin Hauser, DO   2 years ago Diverticulitis   [place], Devonne Doughty, [place]              Passed - Patient is not pregnant      Passed - Last BP in normal range    BP Readings from Last 1 Encounters:  01/21/20 124/86           atorvastatin (LIPITOR) 20 MG tablet [Pharmacy Med Name: ATORVASTATIN 20 MG  TABLET] 90 tablet 1    Sig: TAKE 1 TABLET BY MOUTH EVERY DAY     Cardiovascular:  Antilipid - Statins Failed - 01/26/2021  1:11 AM      Failed - Total Cholesterol in normal range and within 360 days    Cholesterol, Total  Date Value Ref Range Status  01/07/2015 216 (H) 100 - 199 mg/dL Final   Cholesterol  Date Value Ref Range Status  06/06/2019 166 <200 mg/dL Final          Failed - LDL in normal range and within 360 days    LDL Cholesterol (Calc)  Date Value Ref Range Status  06/06/2019 85 mg/dL (calc) Final    Comment:    Reference range: <100 . Desirable range <100 mg/dL for primary prevention;   <70 mg/dL for patients with CHD or diabetic patients  with > or = 2 CHD risk factors. Marland Kitchen LDL-C is now calculated using the Martin-Hopkins  calculation, which is a validated novel method providing  better accuracy than the Friedewald equation in the  estimation of LDL-C.  Cresenciano Genre et al. Annamaria Helling. 9242;683(41):  2061-2068  (http://education.[facility].com/faq/FAQ164)           Failed - HDL in normal range and within 360 days    HDL  Date Value Ref Range Status  06/06/2019 66 > OR = 40 mg/dL Final  01/07/2015 58 >39 mg/dL Final          Failed - Triglycerides in normal range and within 360 days    Triglycerides  Date Value Ref Range Status  06/06/2019 63 <150 mg/dL Final          Passed - Patient is not pregnant      Passed - Valid encounter within last 12 months    Recent Outpatient Visits           10 months ago Blepharitis of right upper eyelid, unspecified type   [place], Devonne Doughty, DO   1 year ago Prediabetes   [place], DO   1 year ago SBO (small bowel obstruction) [facility])   [place], DO   1 year ago Annual physical exam   [facility] O[facility] Hauser, DO   2 years ago Diverticulitis   [facility] [place], Devonne Doughty, DO               metFORMIN (GLUCOPHAGE) 500 MG tablet [Pharmacy Med Name: METFORMIN HCL 500 MG TABLET] 90 tablet 1    Sig: TAKE 1 TABLET BY [place]     Endocrinology:  Diabetes - Biguanides Failed - 01/26/2021  1:11 AM      Failed - Cr in normal range and within 360 days    Creat  Date Value Ref Range Status  06/06/2019 0.67 (L) 0.70 - 1.33 mg/dL Final    Comment:    For patients >60 years of age, the reference limit for Creatinine is approximately 13% higher for people identified as African-American. .    Creatinine, Ser  Date Value Ref Range Status  11/15/2019 0.80 0.61 - 1.24 mg/dL Final          Failed - HBA1C is between 0 and 7.9 and within 180 days    Hgb A1c MFr Bld  Date Value Ref Range Status  01/14/2020 5.6 <5.7 % of total Hgb Final    Comment:    For the purpose of screening for the presence of diabetes: . <5.7%       Consistent with the absence of diabetes 5.7-6.4%    Consistent with increased risk for diabetes             (prediabetes) > or =6.5%  Consistent with diabetes . This assay result is consistent with a decreased risk of diabetes. . Currently, no consensus exists regarding use of hemoglobin A1c for diagnosis of diabetes in children. . According to American Diabetes Association (ADA) guidelines, hemoglobin A1c <7.0% represents optimal control in non-pregnant diabetic patients. Different metrics may apply to specific patient populations.  Standards of Medical Care in Diabetes(ADA). .           Failed - eGFR in normal range and within 360 days    GFR, Est African American  Date Value Ref Range Status  06/06/2019 122 > OR = 60 mL/min/1.3m Final   GFR, Est Non African American  Date Value Ref Range Status  06/06/2019 105 > OR = 60 mL/min/1.773mFinal   GFR, Estimated  Date Value Ref Range Status  11/15/2019 >60 >  60 mL/min Final          Failed - Valid encounter within  last 6 months    Recent Outpatient Visits           10 months ago Blepharitis of right upper eyelid, unspecified type   [place], DO   1 year ago Prediabetes   [place], DO   1 year ago SBO (small bowel obstruction) [facility])   [facility] Olin Hauser, DO   1 year ago Annual physical exam   [facility] Olin Hauser, DO   2 years ago Diverticulitis   [place], DO

## 2021-03-08 ENCOUNTER — Other Ambulatory Visit: Payer: Self-pay

## 2021-03-08 ENCOUNTER — Ambulatory Visit: Payer: Self-pay | Admitting: *Deleted

## 2021-03-08 ENCOUNTER — Emergency Department
Admission: EM | Admit: 2021-03-08 | Discharge: 2021-03-08 | Disposition: A | Discharge status: Home / Self Care | Payer: BC Managed Care – PPO | Attending: Emergency Medicine | Admitting: Emergency Medicine

## 2021-03-08 ENCOUNTER — Emergency Department: Payer: BC Managed Care – PPO

## 2021-03-08 DIAGNOSIS — R197 Diarrhea, unspecified: Secondary | ICD-10-CM | POA: Insufficient documentation

## 2021-03-08 DIAGNOSIS — R112 Nausea with vomiting, unspecified: Secondary | ICD-10-CM | POA: Diagnosis not present

## 2021-03-08 DIAGNOSIS — R1030 Lower abdominal pain, unspecified: Secondary | ICD-10-CM | POA: Diagnosis not present

## 2021-03-08 DIAGNOSIS — R109 Unspecified abdominal pain: Secondary | ICD-10-CM | POA: Diagnosis present

## 2021-03-08 LAB — COMPREHENSIVE METABOLIC PANEL
ALT: 37 U/L (ref 0–44)
AST: 31 U/L (ref 15–41)
Albumin: 4.1 g/dL (ref 3.5–5.0)
Alkaline Phosphatase: 103 U/L (ref 38–126)
Anion gap: 9 (ref 5–15)
BUN: 11 mg/dL (ref 6–20)
CO2: 26 mmol/L (ref 22–32)
Calcium: 9.3 mg/dL (ref 8.9–10.3)
Chloride: 104 mmol/L (ref 98–111)
Creatinine, Ser: 0.86 mg/dL (ref 0.61–1.24)
GFR, Estimated: >60 mL/min (ref >60)
Glucose, Bld: 118 mg/dL — ABNORMAL HIGH (ref 70–99)
Potassium: 4.1 mmol/L (ref 3.5–5.1)
Sodium: 139 mmol/L (ref 135–145)
Total Bilirubin: 0.7 mg/dL (ref 0.3–1.2)
Total Protein: 7.3 g/dL (ref 6.5–8.1)

## 2021-03-08 LAB — CBC
HCT: 47.1 % (ref 39.0–52.0)
Hemoglobin: 15.5 g/dL (ref 13.0–17.0)
MCH: 31.3 pg (ref 26.0–34.0)
MCHC: 32.9 g/dL (ref 30.0–36.0)
MCV: 95 fL (ref 80.0–100.0)
Platelets: 217 10*3/uL (ref 150–400)
RBC: 4.96 MIL/uL (ref 4.22–5.81)
RDW: 12.8 % (ref 11.5–15.5)
WBC: 6.3 10*3/uL (ref 4.0–10.5)
nRBC: 0 % (ref 0.0–0.2)

## 2021-03-08 LAB — TYPE AND SCREEN
ABO/RH(D): O POS
Antibody Screen: NEGATIVE

## 2021-03-08 MED ORDER — IOHEXOL 300 MG/ML  SOLN
100.0000 mL | Freq: Once | INTRAMUSCULAR | Status: AC | PRN
  Administered 2021-03-08: 100 mL via INTRAVENOUS

## 2021-03-08 NOTE — Discharge Instructions (Addendum)
Please return for worse pain, fever or vomiting or severe diarrhea.  Return if you get very weak or lightheaded.  Please follow-up with your regular doctor.  I tested the stool as you remember and there was no blood in it.  CT was negative and the lab work was okay.  Your sugar was minimally elevated at 118.

## 2021-03-08 NOTE — Telephone Encounter (Signed)
Agree with acute care ED visit for these symptoms.  Saralyn Pilar, DO [facility] 03/08/2021, 12:23 PM

## 2021-03-08 NOTE — Telephone Encounter (Addendum)
°  Chief Complaint: abdominal pain, black tarry stools Symptoms: loose tarry stools twice a day, abdominal pain-cramping like he has been doing sit ups. Frequency: stools 2x/day, x 1 week Pertinent Negatives: Patient denies dizziness and nausea and vomiting currently Disposition: [x] ED /[] Urgent Care (no appt availability in office) / [] Appointment(In office/virtual)/ []  [place] [facility]/ [] Home Care/ [] Refused Recommended Disposition /[]  Mobile Bus/ []  Follow-up with PCP Additional Notes:    Reason for Disposition  Black or tarry bowel movements (Exception: chronic-unchanged black-grey bowel movements AND is taking iron pills or Pepto-bismol)  Answer Assessment - Initial Assessment Questions 1. APPEARANCE of BLOOD: "What color is it?" "Is it passed separately, on the surface of the stool, or mixed in with the stool?"      Black tarry stool that is loose x 1week abdominal pain for a couple of weeks 2. AMOUNT: "How much blood was passed?"      Some portions and sometimes complete stool is tarry 3. FREQUENCY: "How many times has blood been passed with the stools?"      2x a day x 1 week 4. ONSET: "When was the blood first seen in the stools?" (Days or weeks)      Abd pain weeks ago, dark stools x 1 week 5. DIARRHEA: "Is there also some diarrhea?" If Yes, ask: "How many diarrhea stools in the past 24 hours?"      Loose stools  8. BLOOD THINNERS: "Do you take any blood thinners?" (e.g., Coumadin/warfarin, Pradaxa/dabigatran, aspirin)     no 9. OTHER SYMPTOMS: "Do you have any other symptoms?"  (e.g., abdomen pain, vomiting, dizziness, fever)  Vomiting-  Only once in two and half weks but not regular, abdominal pain for a couple of weeks, feels like cramping and that he has been doing situps  Protocols used: Rectal Bleeding-A-AH

## 2021-03-08 NOTE — ED Provider Notes (Signed)
[facility] Provider Note    Event Date/Time   First MD Initiated Contact with Patient 03/08/21 1303     (approximate)   History   GI Bleeding   HPI  Jeremy [name] is a 61 y.o. male who reports he developed belly pain in [place] last week.  He had also some loose black tarry stools in the last day or 2.  He had just very loose stools before that.  He also had some nausea and vomiting in the beginning.  He has a history of bowel obstruction in the past.  He is worried he has either GI bleed or bowel obstruction again.      Physical Exam   Triage Vital Signs: ED Triage Vitals  Enc Vitals Group     BP 03/08/21 1221 (!) 162/92     Pulse Rate 03/08/21 1221 89     Resp 03/08/21 1221 18     Temp 03/08/21 1221 98.3 F (36.8 C)     Temp src --      SpO2 03/08/21 1221 98 %     Weight --      Height --      Head Circumference --      Peak Flow --      Pain Score 03/08/21 1614 0     Pain Loc --      Pain Edu? --      Excl. in [place]? --     Most recent vital signs: Vitals:   03/08/21 1221 03/08/21 1546  BP: (!) 162/92 (!) 148/88  Pulse: 89 78  Resp: 18 18  Temp: 98.3 F (36.8 C)   SpO2: 98% 98%     General: Awake, alert, no distress.  CV:  Good peripheral perfusion.  Heart regular rate and rhythm no audible murmurs Resp:  Normal effort.  Lungs are clear Abd:  No distention.  Abdomen is soft bowel sounds are positive there is some lower abdominal tenderness diffusely Rectal: Hemoccult negative   ED Results / Procedures / Treatments   Labs (all labs ordered are listed, but only abnormal results are displayed) Labs Reviewed  COMPREHENSIVE METABOLIC PANEL - Abnormal; Notable for the following components:      Result Value   Glucose, Bld 118 (*)    All other components within normal limits  CBC  POC OCCULT BLOOD, ED  TYPE AND SCREEN     EKG     RADIOLOGY CT read by radiology reviewed by me shows no acute  disease   PROCEDURES:  Critical Care performed:   Procedures   MEDICATIONS ORDERED IN ED: Medications  iohexol (OMNIPAQUE) 300 MG/ML solution 100 mL (100 mLs Intravenous Contrast Given 03/08/21 1457)     IMPRESSION / MDM / [place] / ED COURSE  I reviewed the triage vital signs and the nursing notes. Patient's labs including renal function and hemoglobin hematocrit look good.  His CT is negative.  His stool is Hemoccult negative.  I will let the patient go and follow-up with his primary care doctor.  He can follow-up with GI also if need be later and return if he has any further problems especially vomiting worse pain lightheadedness or bloody stools.  The patient was on the cardiac monitor to evaluate for evidence of arrhythmia and/or significant heart rate changes.  None were seen        FINAL CLINICAL IMPRESSION(S) / ED DIAGNOSES   Final diagnoses:  Lower abdominal pain  Rx / DC Orders   ED Discharge Orders     None        Note:  This document was prepared using Dragon voice recognition software and may include unintentional dictation errors.   Nena Polio, MD 03/08/21 [phone]

## 2021-03-08 NOTE — ED Triage Notes (Addendum)
Pt c/o lower abd pain with black loose stools for the past 2 weeks, states initially he had some N/V but non since. 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

## 2021-03-23 ENCOUNTER — Encounter: Payer: Self-pay | Admitting: Family Medicine

## 2021-03-23 DIAGNOSIS — E663 Overweight: Secondary | ICD-10-CM

## 2021-03-23 DIAGNOSIS — Z0000 Encounter for general adult medical examination without abnormal findings: Secondary | ICD-10-CM

## 2021-03-23 DIAGNOSIS — I10 Essential (primary) hypertension: Secondary | ICD-10-CM

## 2021-03-23 DIAGNOSIS — R7303 Prediabetes: Secondary | ICD-10-CM

## 2021-03-23 DIAGNOSIS — Z125 Encounter for screening for malignant neoplasm of prostate: Secondary | ICD-10-CM

## 2021-03-23 DIAGNOSIS — E782 Mixed hyperlipidemia: Secondary | ICD-10-CM

## 2021-05-03 ENCOUNTER — Telehealth: Payer: Self-pay

## 2021-05-03 NOTE — Telephone Encounter (Signed)
Left voicemail with call back number for patient to call to schedule annual LDCT  

## 2021-07-11 IMAGING — DX DG ABD PORTABLE 1V
2 series · 2 of 2 positions shown · non-contrast
Comparison: 11/13/2019

CLINICAL DATA: Small-bowel obstruction

EXAM:
PORTABLE ABDOMEN - 1 VIEW

[abdomen supine (1 of 2)]
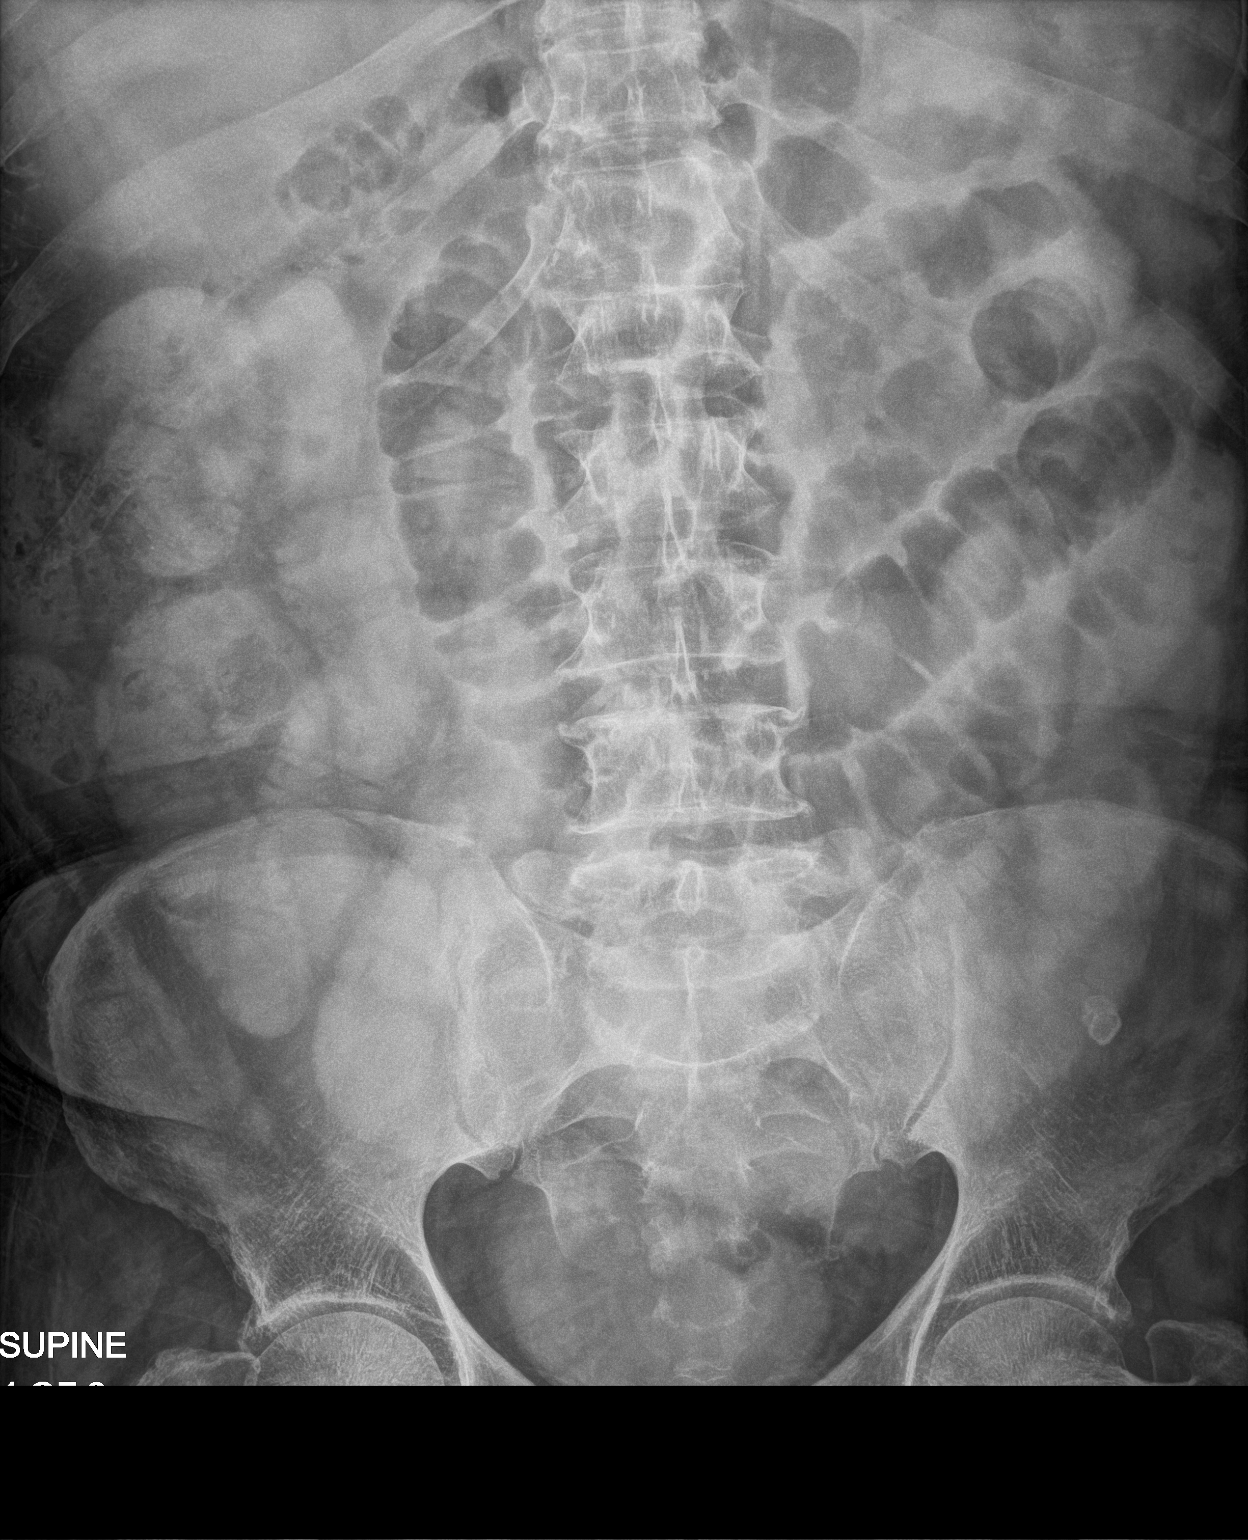

[abdomen supine (2 of 2)]
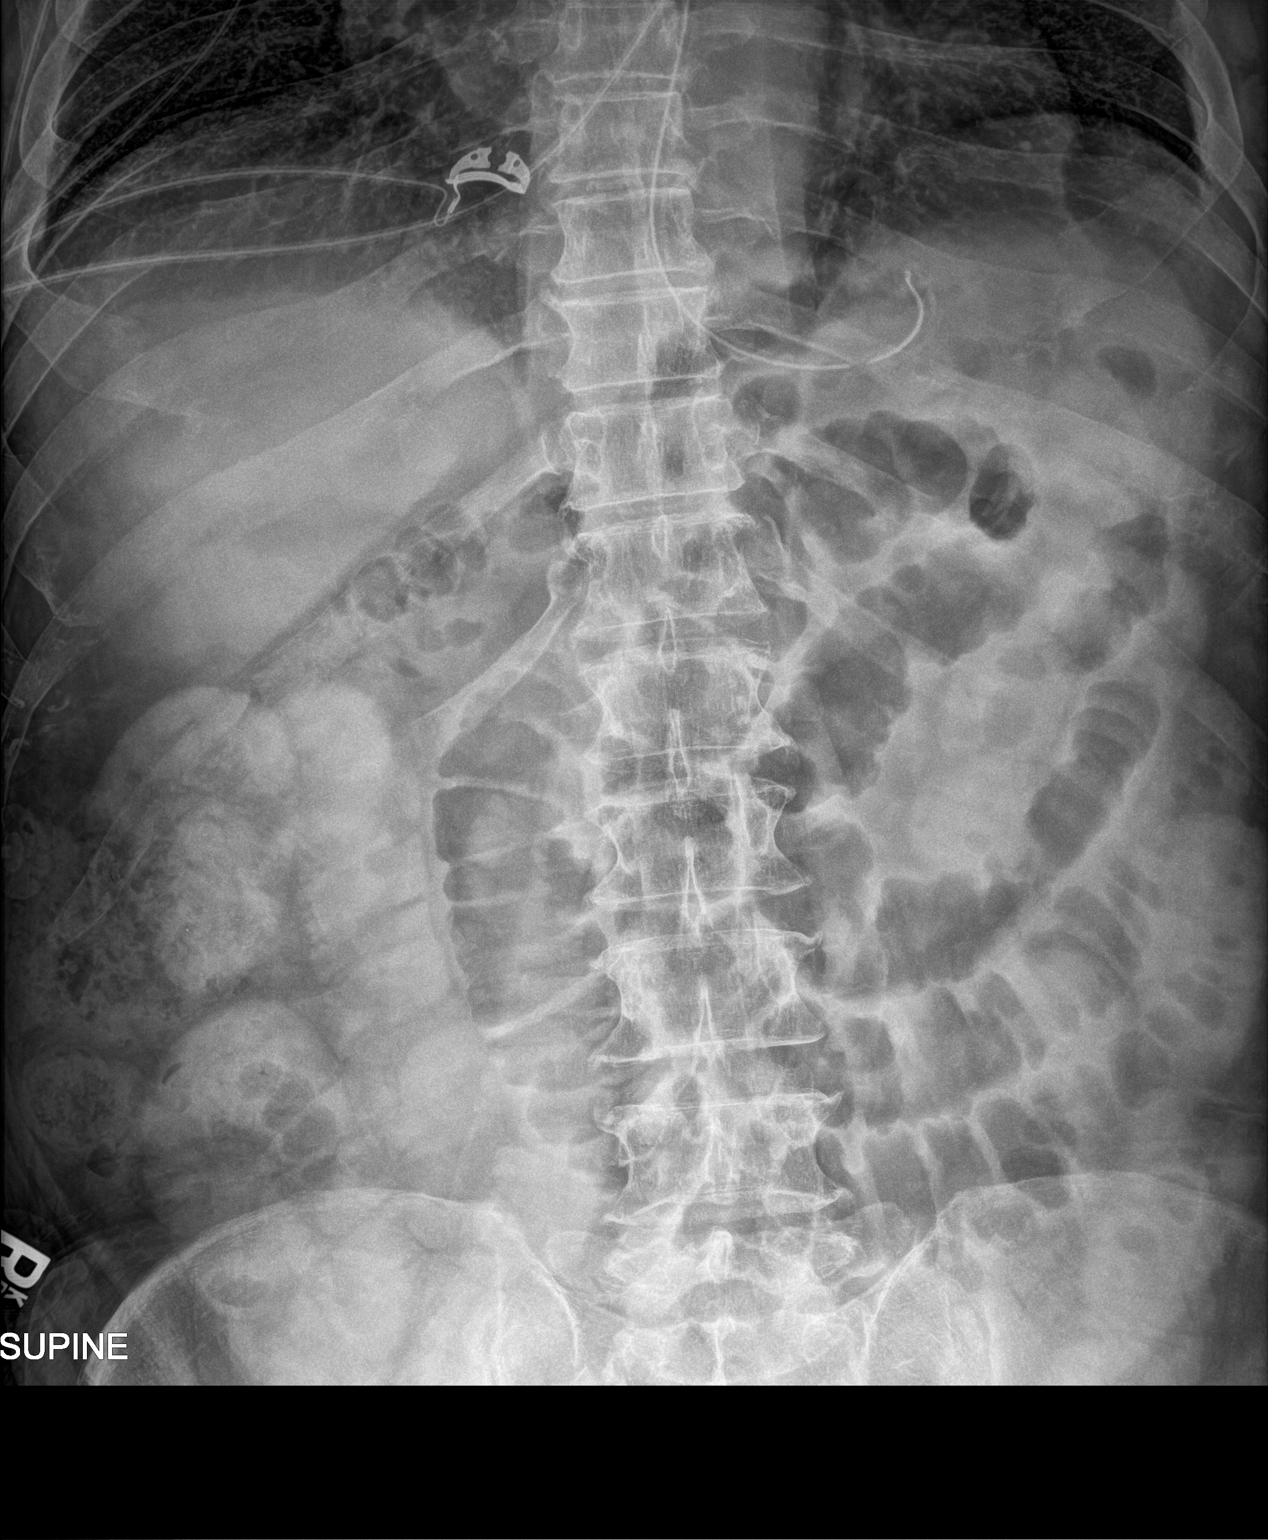

[2 of 2 positions shown; findings below may reference images not displayed]

FINDINGS: Nasogastric tube is seen with its tip overlying the expected gastric
fundus. Multiple dilated loops of small bowel are seen throughout
the abdomen in keeping with a distal small bowel obstruction,
similar to that noted on prior examination. High density material is
seen within several distal loops of dilated small bowel, however,
there is no definite extension into the ascending colon. No gross
free intraperitoneal gas.
IMPRESSION: Persistent, unchanged distal small bowel obstruction.

## 2021-07-12 IMAGING — CR DG ABDOMEN 2V
1 series · 3 of 3 positions shown · non-contrast
Comparison: 11/13/2019

CLINICAL DATA: Bowel obstruction, NG tube in place

EXAM:
ABDOMEN - 2 VIEW

[Series 1: dg abd 2 views · 0.14mm/px · 3 of 3 slices shown]
[im 1/3]
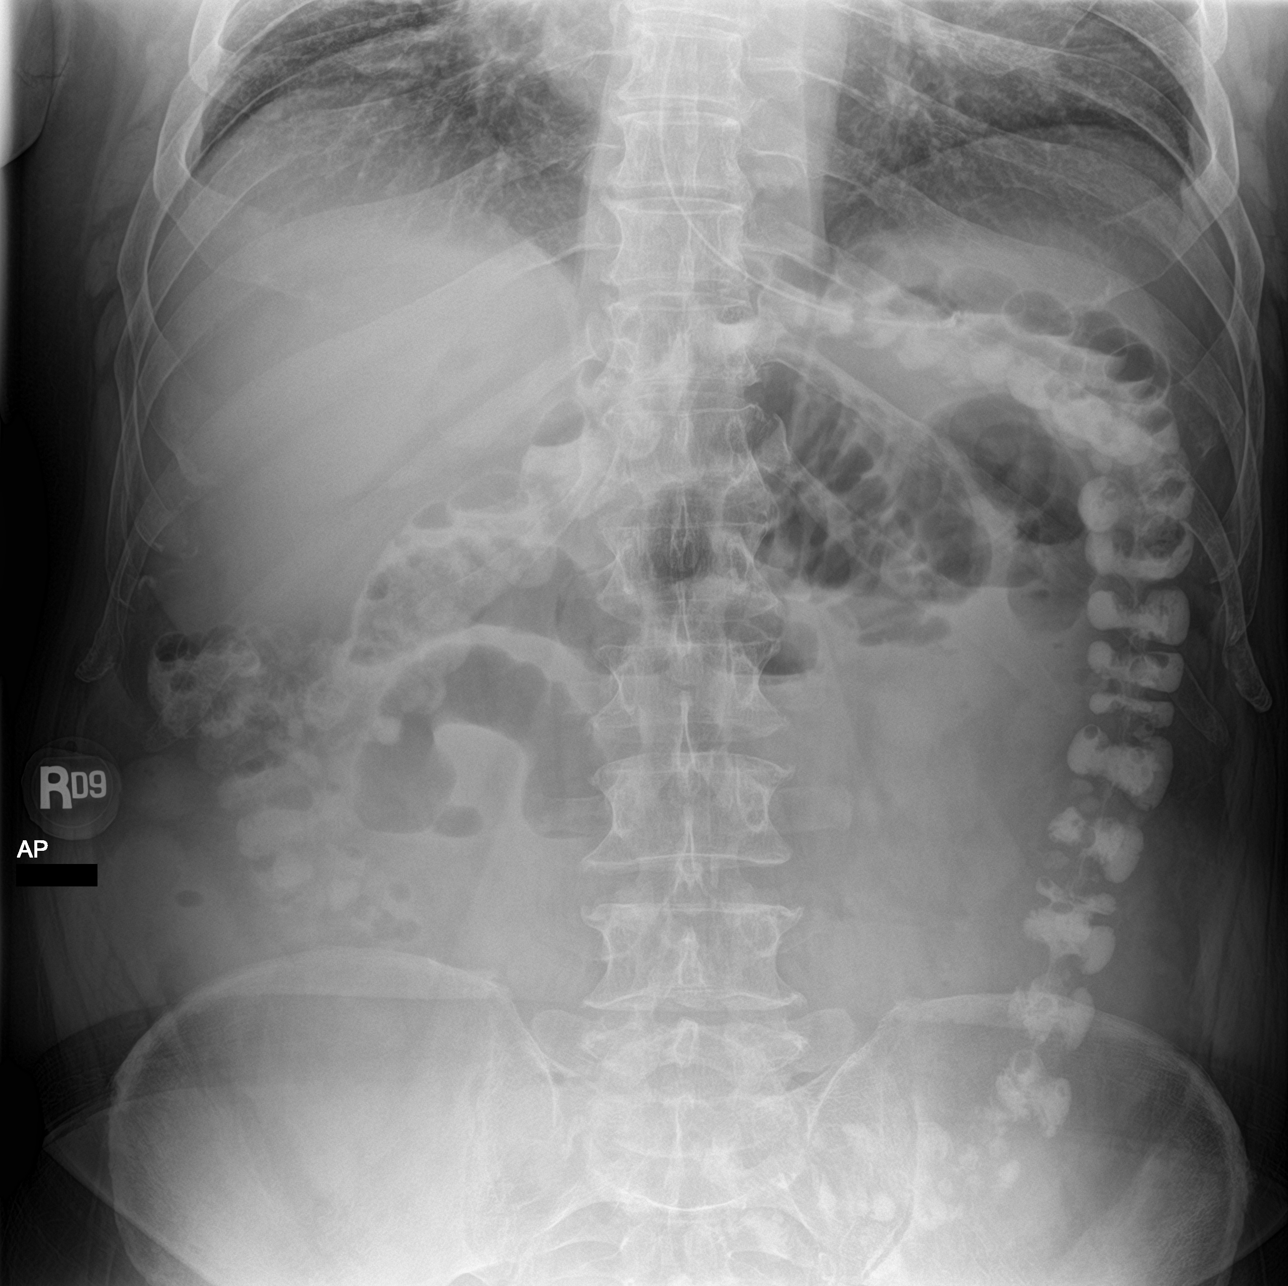
[im 2/3]
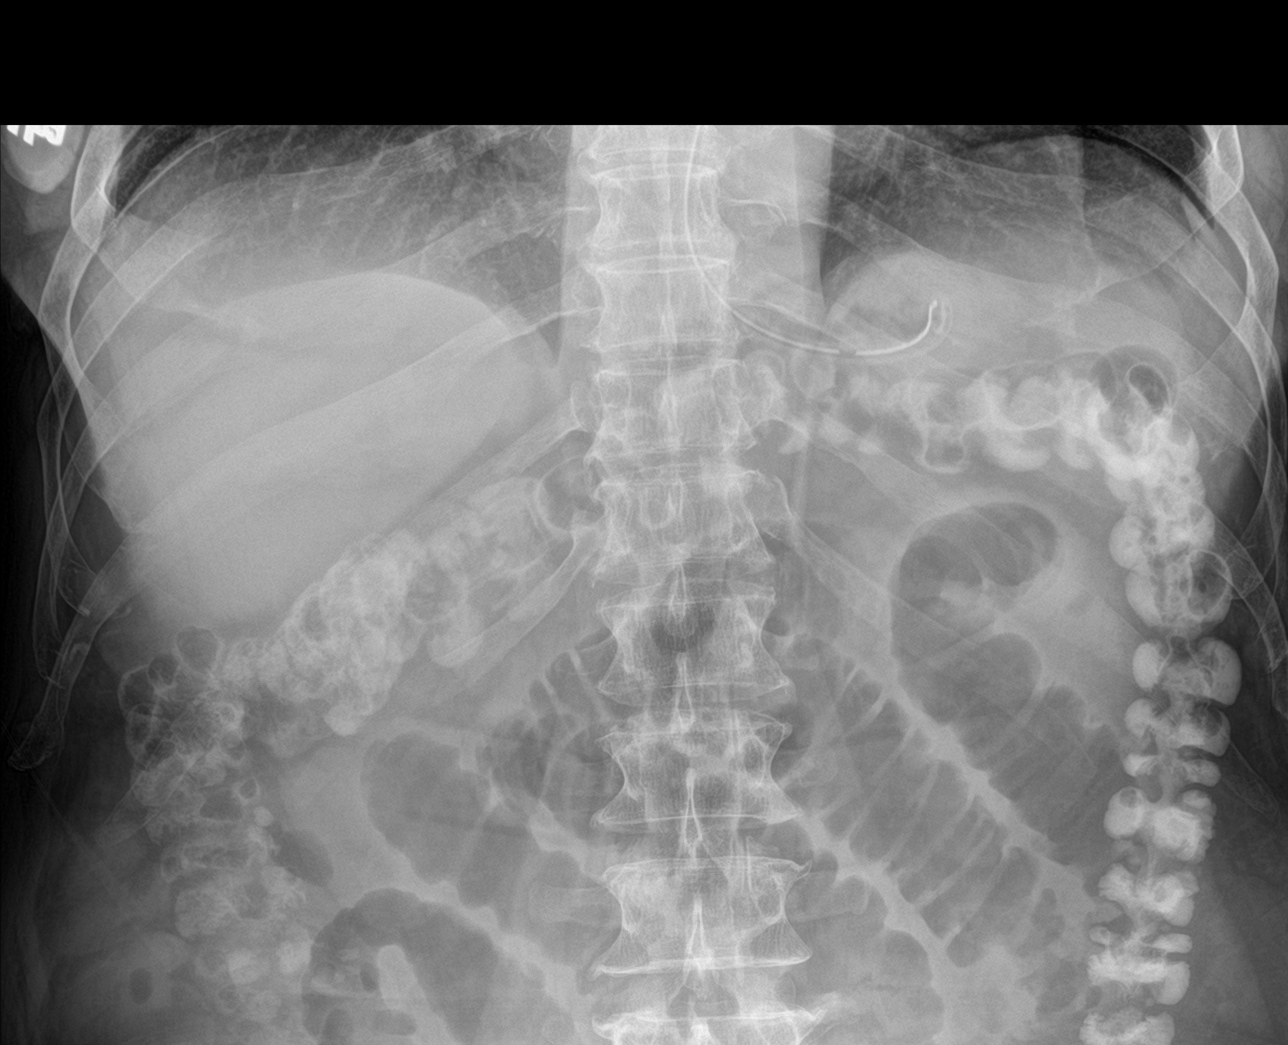
[im 3/3]
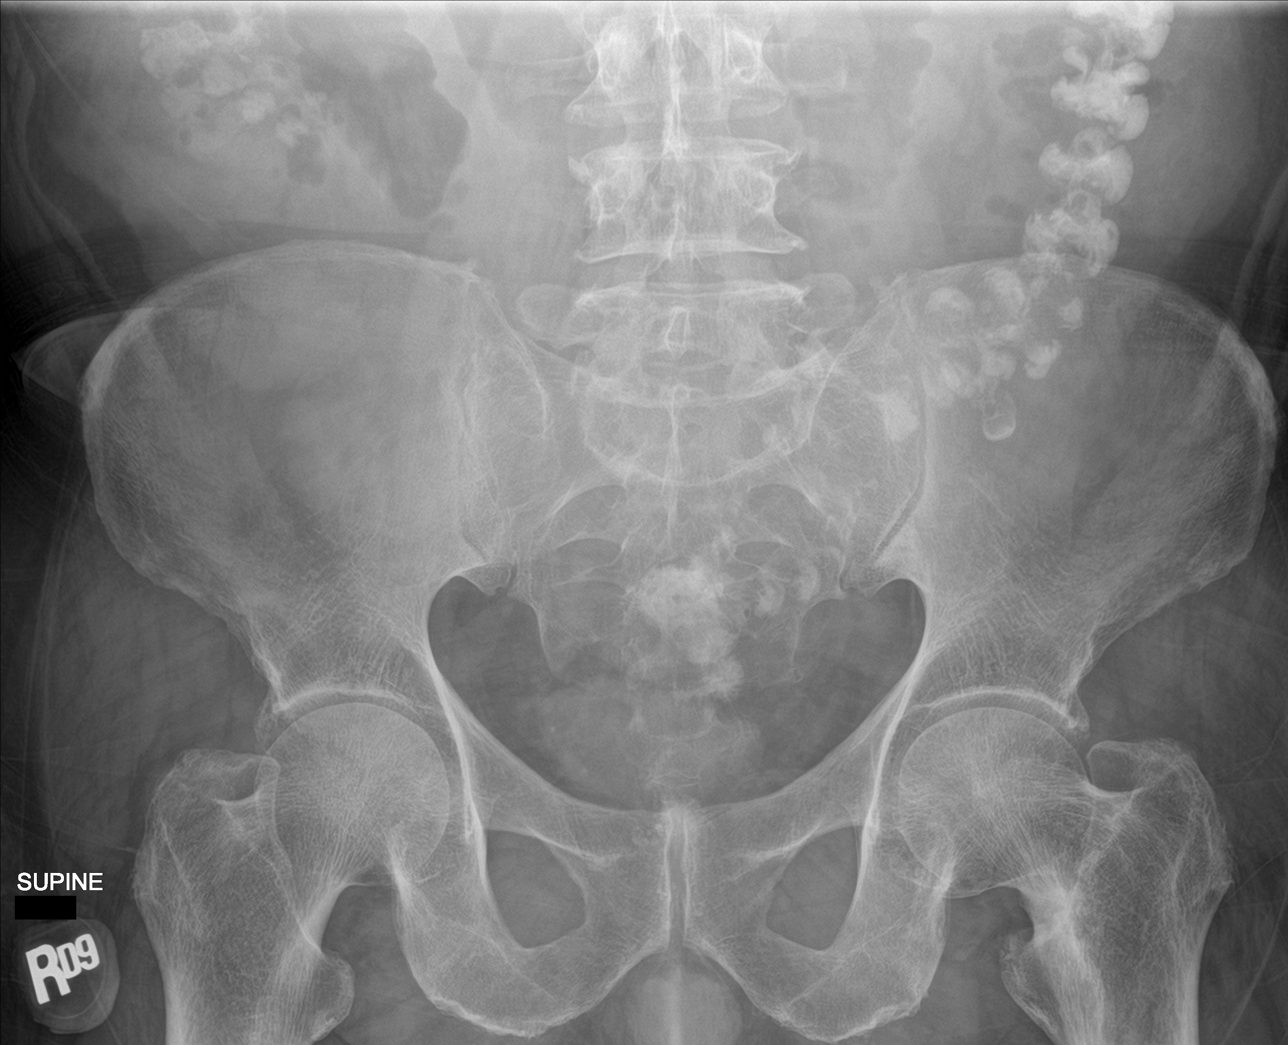

[3 of 3 positions shown; findings below may reference images not displayed]

FINDINGS: Enteric tube passes into the stomach. There is residual contrast
within a decompressed colon. There are persistent dilated loops of
small bowel similar to the prior study. Air-fluid levels are
present. No free air.
IMPRESSION: Similar appearance of small-bowel obstruction.

## 2021-07-27 ENCOUNTER — Other Ambulatory Visit: Payer: Self-pay | Admitting: Family Medicine

## 2021-07-27 DIAGNOSIS — I10 Essential (primary) hypertension: Secondary | ICD-10-CM

## 2021-07-27 DIAGNOSIS — R7303 Prediabetes: Secondary | ICD-10-CM

## 2021-07-27 DIAGNOSIS — K219 Gastro-esophageal reflux disease without esophagitis: Secondary | ICD-10-CM

## 2021-07-27 DIAGNOSIS — E785 Hyperlipidemia, unspecified: Secondary | ICD-10-CM

## 2021-07-27 NOTE — Telephone Encounter (Signed)
Patient called, left VM to return the call to the office to scheduled an appt for medication refill request.   

## 2021-07-27 NOTE — Telephone Encounter (Signed)
Requested medication (s) are due for refill today: yes  Requested medication (s) are on the active medication list: yes  Last refill:  01/26/21 except for protonix 08/11/20  Future visit scheduled: no  Notes to clinic:  pt is overdue for appt and updated labs. Pt called, LVMTCB     Requested Prescriptions  Pending Prescriptions Disp Refills   atorvastatin (LIPITOR) 20 MG tablet [Pharmacy Med Name: ATORVASTATIN 20 MG TABLET] 90 tablet 1    Sig: TAKE 1 TABLET BY MOUTH EVERY DAY     Cardiovascular:  Antilipid - Statins Failed - 07/27/2021  1:49 AM      Failed - Valid encounter within last 12 months    Recent Outpatient Visits           1 year ago Blepharitis of right upper eyelid, unspecified type   [place], Devonne Doughty, DO   1 year ago Prediabetes   [place], DO   1 year ago SBO (small bowel obstruction) [facility])   [place], DO   2 years ago Annual physical exam   [place], Devonne Doughty, DO   2 years ago Diverticulitis   [place], DO              Failed - Lipid Panel in normal range within the last 12 months    Cholesterol, Total  Date Value Ref Range Status  01/07/2015 216 (H) 100 - 199 mg/dL Final   Cholesterol  Date Value Ref Range Status  06/06/2019 166 <200 mg/dL Final   LDL Cholesterol (Calc)  Date Value Ref Range Status  06/06/2019 85 mg/dL (calc) Final    Comment:    Reference range: <100 . Desirable range <100 mg/dL for primary prevention;   <70 mg/dL for patients with CHD or diabetic patients  with > or = 2 CHD risk factors. Marland Kitchen LDL-C is now calculated using the Martin-Hopkins  calculation, which is a validated novel method providing  better accuracy than the Friedewald equation in the  estimation of LDL-C.  Cresenciano Genre et al. Annamaria Helling. 8341;962(22): 2061-2068   (http://education.[facility].com/faq/FAQ164)    HDL  Date Value Ref Range Status  06/06/2019 66 > OR = 40 mg/dL Final  01/07/2015 58 >39 mg/dL Final   Triglycerides  Date Value Ref Range Status  06/06/2019 63 <150 mg/dL Final         Passed - Patient is not pregnant       amLODipine-benazepril (LOTREL) 5-10 MG capsule [Pharmacy Med Name: AMLODIPINE-BENAZEPRIL 5-10 MG] 90 capsule 1    Sig: TAKE 1 CAPSULE BY MOUTH EVERY DAY     Cardiovascular: CCB + ACEI Combos Failed - 07/27/2021  1:49 AM      Failed - Last BP in normal range    BP Readings from Last 1 Encounters:  03/08/21 (!) 148/88         Failed - Valid encounter within last 6 months    Recent Outpatient Visits           1 year ago Blepharitis of right upper eyelid, unspecified type   [place], DO   1 year ago Prediabetes   [place], DO   1 year ago SBO (small bowel obstruction) [facility])   [place], DO   2 years ago Annual physical exam  [facility] [place], Devonne Doughty, DO   2 years ago Diverticulitis   [place], [place]              Passed - Cr in normal range and within 180 days    Creat  Date Value Ref Range Status  06/06/2019 0.67 (L) 0.70 - 1.33 mg/dL Final    Comment:    For patients >36 years of age, the reference limit for Creatinine is approximately 13% higher for people identified as African-American. .    Creatinine, Ser  Date Value Ref Range Status  03/08/2021 0.86 0.61 - 1.24 mg/dL Final         Passed - K in normal range and within 180 days    Potassium  Date Value Ref Range Status  03/08/2021 4.1 3.5 - 5.1 mmol/L Final         Passed - Na in normal range and within 180 days    Sodium  Date Value Ref Range Status  03/08/2021 139 135 - 145 mmol/L Final  01/07/2015 140 136 - 144 mmol/L  Final    Comment:    **Effective January 18, 2015 the reference interval**   for Sodium, Serum will be changing to:                                             134 - 144          Passed - eGFR is 30 or above and within 180 days    GFR, Est African American  Date Value Ref Range Status  06/06/2019 122 > OR = 60 mL/min/1.94m Final   GFR, Est Non African American  Date Value Ref Range Status  06/06/2019 105 > OR = 60 mL/min/1.781mFinal   GFR, Estimated  Date Value Ref Range Status  03/08/2021 >60 >60 mL/min Final    Comment:    (NOTE) Calculated using the CKD-EPI Creatinine Equation (2021)          Passed - Patient is not pregnant       pantoprazole (PROTONIX) 40 MG tablet [Pharmacy Med Name: PANTOPRAZOLE SOD DR 40 MG TAB] 90 tablet 3    Sig: TAKE 1 TABLET BY MOUTH EVERY DAY BEFORE BREAKFAST     Gastroenterology: Proton Pump Inhibitors Failed - 07/27/2021  1:49 AM      Failed - Valid encounter within last 12 months    Recent Outpatient Visits           1 year ago Blepharitis of right upper eyelid, unspecified type   [place]   1 year ago Predi[place]   [place]   1 year [facility]   2 years ago Annual physical exam   [facility]   2 years ago Diverticulitis   [place]               metFORMIN (GLUCOPHAGE) 500 MG tablet [Pharmacy Med Name: METFORMIN HCL 500 MG TABLET] 90 tablet 1    Sig: TAKE 1 TABLET BY MOUTH EVERY DAY WITH BREAKFAST     Endocrinology:  Diabetes - Biguanides Failed - 07/27/2021  1:49  AM      Failed - HBA1C is between 0 and 7.9 and within 180 days    Hgb A1c MFr Bld  Date Value Ref Range Status  01/14/2020 5.6 <5.7 % of total Hgb Final    Comment:    For the purpose of  screening for the presence of diabetes: . <5.7%       Consistent with the absence of diabetes 5.7-6.4%    Consistent with increased risk for diabetes             (prediabetes) > or =6.5%  Consistent with diabetes . This assay result is consistent with a decreased risk of diabetes. . Currently, no consensus exists regarding use of hemoglobin A1c for diagnosis of diabetes in children. . According to American Diabetes Association (ADA) guidelines, hemoglobin A1c <7.0% represents optimal control in non-pregnant diabetic patients. Different metrics may apply to specific patient populations.  Standards of Medical Care in Diabetes(ADA). .          Failed - B12 Level in normal range and within 720 days    No results found for: "VITAMINB12"       Failed - Valid encounter within last 6 months    Recent Outpatient Visits           1 year ago Blepharitis of right upper eyelid, unspecified type   [place], Devonne Doughty, DO   1 year ago Prediabetes   [place], DO   1 year ago SBO (small bowel obstruction) [facility])   [facility] Olin Hauser, DO   2 years ago Annual physical exam   [facility] Olin Hauser, DO   2 years ago Diverticulitis   [facility] Olin Hauser, DO              Failed - CBC within normal limits and completed in the last 12 months    WBC  Date Value Ref Range Status  03/08/2021 6.3 4.0 - 10.5 K/uL Final   RBC  Date Value Ref Range Status  03/08/2021 4.96 4.22 - 5.81 MIL/uL Final   Hemoglobin  Date Value Ref Range Status  03/08/2021 15.5 13.0 - 17.0 g/dL Final  01/07/2015 16.7 12.6 - 17.7 g/dL Final   HCT  Date Value Ref Range Status  03/08/2021 47.1 39.0 - 52.0 % Final   Hematocrit  Date Value Ref Range Status  01/07/2015 47.4 37.5 - 51.0 % Final   MCHC  Date Value Ref Range Status   03/08/2021 32.9 30.0 - 36.0 g/dL Final   [facility]  Date Value Ref Range Status  03/08/2021 31.3 26.0 - 34.0 pg Final   MCV  Date Value Ref Range Status  03/08/2021 95.0 80.0 - 100.0 fL Final  01/07/2015 93 79 - 97 fL Final   No results found for: "PLTCOUNTKUC", "LABPLAT", "POCPLA" RDW  Date Value Ref Range Status  03/08/2021 12.8 11.5 - 15.5 % Final  01/07/2015 12.9 12.3 - 15.4 % Final         Passed - Cr in normal range and within 360 days    Creat  Date Value Ref Range Status  06/06/2019 0.67 (L) 0.70 - 1.33 mg/dL Final    Comment:    For patients >61 years of age, the reference limit for Creatinine is approximately 13% higher for people identified as African-American. .    Creatinine, Ser  Date Value Ref Range Status  03/08/2021 0.86 0.61 - 1.24 mg/dL Final         Passed - eGFR in normal range and within 360 days    GFR, Est African American  Date Value Ref Range Status  06/06/2019 122 > OR = 60 mL/min/1.42m Final   GFR, Est Non African American  Date Value Ref Range Status  06/06/2019 105 > OR = 60 mL/min/1.722mFinal   GFR, Estimated  Date Value Ref Range Status  03/08/2021 >60 >60 mL/min Final    Comment:    (NOTE) Calculated using the CKD-EPI Creatinine Equation (2021)

## 2021-08-11 ENCOUNTER — Other Ambulatory Visit: Payer: Self-pay

## 2021-08-11 DIAGNOSIS — Z125 Encounter for screening for malignant neoplasm of prostate: Secondary | ICD-10-CM

## 2021-08-11 DIAGNOSIS — E782 Mixed hyperlipidemia: Secondary | ICD-10-CM

## 2021-08-11 DIAGNOSIS — Z0000 Encounter for general adult medical examination without abnormal findings: Secondary | ICD-10-CM

## 2021-08-11 DIAGNOSIS — R7303 Prediabetes: Secondary | ICD-10-CM

## 2021-08-15 ENCOUNTER — Other Ambulatory Visit: Payer: Self-pay

## 2021-08-15 DIAGNOSIS — Z87891 Personal history of nicotine dependence: Secondary | ICD-10-CM

## 2021-08-15 DIAGNOSIS — Z122 Encounter for screening for malignant neoplasm of respiratory organs: Secondary | ICD-10-CM

## 2021-08-26 ENCOUNTER — Ambulatory Visit
Admission: RE | Admit: 2021-08-26 | Discharge: 2021-08-26 | Disposition: A | Discharge status: Home / Self Care | Payer: BC Managed Care – PPO | Source: Ambulatory Visit | Attending: Acute Care | Admitting: Acute Care

## 2021-08-26 DIAGNOSIS — Z87891 Personal history of nicotine dependence: Secondary | ICD-10-CM | POA: Insufficient documentation

## 2021-08-26 DIAGNOSIS — R918 Other nonspecific abnormal finding of lung field: Secondary | ICD-10-CM | POA: Insufficient documentation

## 2021-08-26 DIAGNOSIS — I700 Atherosclerosis of aorta: Secondary | ICD-10-CM | POA: Diagnosis not present

## 2021-08-26 DIAGNOSIS — J432 Centrilobular emphysema: Secondary | ICD-10-CM | POA: Diagnosis not present

## 2021-08-26 DIAGNOSIS — Z122 Encounter for screening for malignant neoplasm of respiratory organs: Secondary | ICD-10-CM | POA: Insufficient documentation

## 2021-08-29 ENCOUNTER — Other Ambulatory Visit: Payer: Self-pay | Admitting: Acute Care

## 2021-08-29 DIAGNOSIS — Z87891 Personal history of nicotine dependence: Secondary | ICD-10-CM

## 2021-08-29 DIAGNOSIS — Z122 Encounter for screening for malignant neoplasm of respiratory organs: Secondary | ICD-10-CM

## 2021-09-06 ENCOUNTER — Other Ambulatory Visit: Payer: BC Managed Care – PPO

## 2021-09-06 DIAGNOSIS — E782 Mixed hyperlipidemia: Secondary | ICD-10-CM

## 2021-09-06 DIAGNOSIS — Z125 Encounter for screening for malignant neoplasm of prostate: Secondary | ICD-10-CM

## 2021-09-06 DIAGNOSIS — R7303 Prediabetes: Secondary | ICD-10-CM

## 2021-09-06 DIAGNOSIS — Z0000 Encounter for general adult medical examination without abnormal findings: Secondary | ICD-10-CM

## 2021-09-07 LAB — HEMOGLOBIN A1C
Hgb A1c MFr Bld: 6.2 % of total Hgb — ABNORMAL HIGH (ref <5.7)
Mean Plasma Glucose: 131 mg/dL
eAG (mmol/L): 7.3 mmol/L

## 2021-09-07 LAB — COMPREHENSIVE METABOLIC PANEL
AG Ratio: 1.7 (calc) (ref 1.0–2.5)
ALT: 40 U/L (ref 9–46)
AST: 34 U/L (ref 10–35)
Albumin: 4.3 g/dL (ref 3.6–5.1)
Alkaline phosphatase (APISO): 91 U/L (ref 35–144)
BUN: 10 mg/dL (ref 7–25)
CO2: 27 mmol/L (ref 20–32)
Calcium: 9.6 mg/dL (ref 8.6–10.3)
Chloride: 105 mmol/L (ref 98–110)
Creat: 0.78 mg/dL (ref 0.70–1.35)
Globulin: 2.6 g/dL (calc) (ref 1.9–3.7)
Glucose, Bld: 135 mg/dL — ABNORMAL HIGH (ref 65–99)
Potassium: 4.3 mmol/L (ref 3.5–5.3)
Sodium: 141 mmol/L (ref 135–146)
Total Bilirubin: 0.5 mg/dL (ref 0.2–1.2)
Total Protein: 6.9 g/dL (ref 6.1–8.1)

## 2021-09-07 LAB — CBC WITH DIFFERENTIAL/PLATELET
Absolute Monocytes: 450 cells/uL (ref 200–950)
Basophils Absolute: 60 cells/uL (ref 0–200)
Basophils Relative: 1 %
Eosinophils Absolute: 198 cells/uL (ref 15–500)
Eosinophils Relative: 3.3 %
HCT: 47.8 % (ref 38.5–50.0)
Hemoglobin: 16 g/dL (ref 13.2–17.1)
Lymphs Abs: 2382 cells/uL (ref 850–3900)
MCH: 32.3 pg (ref 27.0–33.0)
MCHC: 33.5 g/dL (ref 32.0–36.0)
MCV: 96.6 fL (ref 80.0–100.0)
MPV: 11.1 fL (ref 7.5–12.5)
Monocytes Relative: 7.5 %
Neutro Abs: 2910 cells/uL (ref 1500–7800)
Neutrophils Relative %: 48.5 %
Platelets: 176 10*3/uL (ref 140–400)
RBC: 4.95 10*6/uL (ref 4.20–5.80)
RDW: 12.1 % (ref 11.0–15.0)
Total Lymphocyte: 39.7 %
WBC: 6 10*3/uL (ref 3.8–10.8)

## 2021-09-07 LAB — LIPID PANEL
Cholesterol: 179 mg/dL (ref <200)
HDL: 62 mg/dL (ref >=40)
LDL Cholesterol (Calc): 92 mg/dL (calc)
Non-HDL Cholesterol (Calc): 117 mg/dL (calc) (ref <130)
Total CHOL/HDL Ratio: 2.9 (calc) (ref <5.0)
Triglycerides: 152 mg/dL — ABNORMAL HIGH (ref <150)

## 2021-09-07 LAB — PSA: PSA: 1.19 ng/mL (ref <=4.00)

## 2021-09-13 ENCOUNTER — Encounter: Payer: Self-pay | Admitting: Family Medicine

## 2021-09-13 ENCOUNTER — Ambulatory Visit (INDEPENDENT_AMBULATORY_CARE_PROVIDER_SITE_OTHER): Payer: BC Managed Care – PPO | Admitting: Family Medicine

## 2021-09-13 VITALS — BP 136/78 | HR 94 | Ht 72.0 in | Wt 222.0 lb

## 2021-09-13 DIAGNOSIS — E782 Mixed hyperlipidemia: Secondary | ICD-10-CM | POA: Diagnosis not present

## 2021-09-13 DIAGNOSIS — Z0000 Encounter for general adult medical examination without abnormal findings: Secondary | ICD-10-CM

## 2021-09-13 DIAGNOSIS — E669 Obesity, unspecified: Secondary | ICD-10-CM

## 2021-09-13 DIAGNOSIS — R7303 Prediabetes: Secondary | ICD-10-CM

## 2021-09-13 NOTE — Progress Notes (Signed)
Subjective:    Patient ID: Jeremy [name], male    DOB: Nov 27, 1960, 61 y.o.   MRN: [id number]  Jeremy [name] is a 61 y.o. male presenting on 09/13/2021 for Annual Exam   HPI  Here Annual Physical and Lab Review.  Pre-Diabetes / Overweight BMI >28 He has had range of 5.5 to 6.0 previously Weight up to 222 lbs, prior 216 Last result A1c 6.2, elevated, due to weight gain He is doing well overall with lifestyle. CBGs: none lately Meds: Metformin IR 500mg  daily - tolerating well Currently on ACEi Lifestyle: - Weight up 9 lbs Diet - Stable diet, mediterranean heart healthy, whole grains, no red meat , mostly water, no sodas, occasional beer. No salt sodium. Exercise - increased walking Denies hypoglycemia   CHRONIC HTN: Checks home BP readings 120-140s range in past Current Meds - Amlodipine-Benazepril 5-10mg  daily Reports good compliance, took meds today. Tolerating well, w/o complaints Denies CP, dyspnea, HA, edema, dizziness / lightheadedness   HYPERLIPIDEMIA: - Reports no concerns. Last lipid panel 09/2021, controlled overall with improvement. - Currently taking Atorvastatin 20mg , tolerating well without side effects or myalgias  Follow-up Pulm Nodule, RLL Has had LDCT monitoring    Health Maintenance:   UTD COVID19 Vaccine   Prostate CA Screening: Prior PSA / DRE reported normal. Last PSA 1.19 2023 stable from 1.2 Currently asymptomatic. No known family history of prostate CA.    Colon CA Screening: Last Colonoscopy on 11/07/17 (done by [facility] Dr Marius Ditch), results with 0 polyp no specimen, good for 10 years. Currently asymptomatic, has occasional hemorrhoid. No known family history of colon CA  Shingrix vaccine, due when ready, he is considering.     09/13/2021    5:27 PM 12/12/2018   11:31 AM 10/01/2018    8:35 AM  Depression screen PHQ 2/9  Decreased Interest 0 0 0  Down, Depressed, Hopeless 0 0 0  PHQ - 2 Score 0 0 0  Altered sleeping  0   Tired, decreased  energy  0   Change in appetite  0   Feeling bad or failure about yourself   0   Trouble concentrating  0   Moving slowly or fidgety/restless  0   Suicidal thoughts  0   PHQ-9 Score  0     Past Medical History:  Diagnosis Date   GERD (gastroesophageal reflux disease)    Hypertension    Pre-diabetes    Past Surgical History:  Procedure Laterality Date   APPENDECTOMY     COLONOSCOPY     COLONOSCOPY WITH PROPOFOL N/A 11/07/2017   Procedure: COLONOSCOPY WITH PROPOFOL;  Surgeon: Lin Landsman, MD;  Location: [place];  Service: Endoscopy;  Laterality: N/A;   INGUINAL HERNIA REPAIR Bilateral    NASAL SEPTOPLASTY W/ TURBINOPLASTY  2009   Dr Tami Ribas ENT   Social History   Socioeconomic History   Marital status: Married    Spouse name: Not on file   Number of children: Not on file   Years of education: Not on file   Highest education level: Not on file  Occupational History   Not on file  Tobacco Use   Smoking status: Former    Packs/day: 1.00    Years: 35.00    Total pack years: 35.00    Types: Cigarettes    Quit date: 01/06/2014    Years since quitting: 7.6   Smokeless tobacco: Former  Scientific laboratory technician Use: Never used  Substance and Sexual  Activity   Alcohol use: Yes    Alcohol/week: 6.0 standard drinks of alcohol    Types: 6 Cans of beer per week   Drug use: No   Sexual activity: Not on file  Other Topics Concern   Not on file  Social History Narrative   Not on file   Social Determinants of Health   Financial Resource Strain: Not on file  Food Insecurity: Not on file  Transportation Needs: Not on file  Physical Activity: Not on file  Stress: Not on file  Social Connections: Not on file  Intimate Partner Violence: Not on file   Family History  Problem Relation Age of Onset   Cancer Mother        breast   Clotting disorder Father    Heart attack Father    Heart attack Brother    Pancreatic cancer Brother 23       pancreatic    Current Outpatient Medications on File Prior to Visit  Medication Sig   amLODipine-benazepril (LOTREL) 5-10 MG capsule TAKE 1 CAPSULE BY MOUTH EVERY DAY   atorvastatin (LIPITOR) 20 MG tablet TAKE 1 TABLET BY MOUTH EVERY DAY   cetirizine (ZYRTEC) 10 MG tablet Take 10 mg by mouth daily.   fluticasone (FLONASE) 50 MCG/ACT nasal spray PLACE 2 SPRAYS INTO BOTH NOSTRILS DAILY. USE FOR 4-6 WEEKS THEN STOP AND USE SEASONALLY   hydrocortisone (ANUSOL-HC) 25 MG suppository Place 1 suppository (25 mg total) rectally 2 (two) times daily. For 7 days   metFORMIN (GLUCOPHAGE) 500 MG tablet TAKE 1 TABLET BY MOUTH EVERY DAY WITH BREAKFAST   Multiple Vitamin (MULTIVITAMIN) tablet Take 1 tablet by mouth daily. Men's a day   pantoprazole (PROTONIX) 40 MG tablet TAKE 1 TABLET BY MOUTH EVERY DAY BEFORE BREAKFAST   trimethoprim-polymyxin b (POLYTRIM) ophthalmic solution Place 1 drop into the right eye every 6 (six) hours. For up to 7-10 days.   No current facility-administered medications on file prior to visit.    Review of Systems  Constitutional:  Negative for activity change, appetite change, chills, diaphoresis, fatigue and fever.  HENT:  Negative for congestion and hearing loss.   Eyes:  Negative for visual disturbance.  Respiratory:  Negative for cough, chest tightness, shortness of breath and wheezing.   Cardiovascular:  Negative for chest pain, palpitations and leg swelling.  Gastrointestinal:  Negative for abdominal pain, constipation, diarrhea, nausea and vomiting.  Genitourinary:  Negative for dysuria, frequency and hematuria.  Musculoskeletal:  Negative for arthralgias and neck pain.  Skin:  Negative for rash.  Neurological:  Negative for dizziness, weakness, light-headedness, numbness and headaches.  Hematological:  Negative for adenopathy.  Psychiatric/Behavioral:  Negative for behavioral problems, dysphoric mood and sleep disturbance.    Per HPI unless specifically indicated above       Objective:    BP 136/78 (BP Location: Left Arm, Cuff Size: Normal)   Pulse 94   Ht 6' (1.829 m)   Wt 222 lb (100.7 kg)   SpO2 98%   BMI 30.11 kg/m   Wt Readings from Last 3 Encounters:  09/13/21 222 lb (100.7 kg)  08/26/21 220 lb (99.8 kg)  03/25/20 216 lb (98 kg)    Physical Exam Vitals and nursing note reviewed.  Constitutional:      General: He is not in acute distress.    Appearance: He is well-developed. He is not diaphoretic.     Comments: Well-appearing, comfortable, cooperative  HENT:     Head: Normocephalic and atraumatic.  Eyes:     General:        Right eye: No discharge.        Left eye: No discharge.     Conjunctiva/sclera: Conjunctivae normal.     Pupils: Pupils are equal, round, and reactive to light.  Neck:     Thyroid: No thyromegaly.     Vascular: No carotid bruit.  Cardiovascular:     Rate and Rhythm: Normal rate and regular rhythm.     Pulses: Normal pulses.     Heart sounds: Normal heart sounds. No murmur heard. Pulmonary:     Effort: Pulmonary effort is normal. No respiratory distress.     Breath sounds: Normal breath sounds. No wheezing or rales.  Abdominal:     General: Bowel sounds are normal. There is no distension.     Palpations: Abdomen is soft. There is no mass.     Tenderness: There is no abdominal tenderness.  Musculoskeletal:        General: No tenderness. Normal range of motion.     Cervical back: Normal range of motion and neck supple.     Right lower leg: No edema.     Left lower leg: No edema.     Comments: Upper / Lower Extremities: - Normal muscle tone, strength bilateral upper extremities 5/5, lower extremities 5/5  Lymphadenopathy:     Cervical: No cervical adenopathy.  Skin:    General: Skin is warm and dry.     Findings: No erythema or rash.  Neurological:     Mental Status: He is alert and oriented to person, place, and time.     Comments: Distal sensation intact to light touch all extremities  Psychiatric:         Mood and Affect: Mood normal.        Behavior: Behavior normal.        Thought Content: Thought content normal.     Comments: Well groomed, good eye contact, normal speech and thoughts     I have personally reviewed the radiology report from 08/26/21.  CLINICAL DATA:  Lung cancer screening. Former asymptomatic smoker. Thirty-five pack-year history.   EXAM: CT CHEST WITHOUT CONTRAST LOW-DOSE FOR LUNG CANCER SCREENING   TECHNIQUE: Multidetector CT imaging of the chest was performed following the standard protocol without IV contrast.   RADIATION DOSE REDUCTION: This exam was performed according to the departmental dose-optimization program which includes automated exposure control, adjustment of the mA and/or kV according to patient size and/or use of iterative reconstruction technique.   COMPARISON:  01/28/2020   FINDINGS: Cardiovascular: Heart size normal. Aortic atherosclerosis and coronary artery calcification. No pericardial effusion.   Mediastinum/Nodes: No enlarged mediastinal, hilar, or axillary lymph nodes. Thyroid gland, trachea, and esophagus demonstrate no significant findings.   Lungs/Pleura: There is mild centrilobular emphysema with diffuse bronchial wall thickening. No pleural effusion, airspace consolidation or pneumothorax. Multiple pulmonary nodules are scattered throughout both lungs. The largest is in the left lower lobe with a mean derived diameter of 7.1 mm. These are stable in the interval. No suspicious lung nodules identified at this time   Upper Abdomen: No acute abnormality.   Musculoskeletal: No chest wall mass or suspicious bone lesions identified.   IMPRESSION: 1. Lung-RADS 2, benign appearance or behavior. Continue annual screening with low-dose chest CT without contrast in 12 months. 2. Emphysema and diffuse bronchial wall thickening with emphysema, as above; imaging findings suggestive of underlying COPD. 3. Aortic Atherosclerosis  (ICD10-I70.0) and Emphysema (ICD10-J43.9).  Electronically Signed   By: Signa Kell M.D.   On: 08/27/2021 17:56  Results for orders placed or performed in visit on 09/06/21  HgB A1c  Result Value Ref Range   Hgb A1c MFr Bld 6.2 (H) <5.7 % of total Hgb   Mean Plasma Glucose 131 mg/dL   eAG (mmol/L) 7.3 mmol/L  PSA  Result Value Ref Range   PSA 1.19 < OR = 4.00 ng/mL  Lipid panel  Result Value Ref Range   Cholesterol 179 <200 mg/dL   HDL 62 > OR = 40 mg/dL   Triglycerides 778 (H) <150 mg/dL   LDL Cholesterol (Calc) 92 mg/dL (calc)   Total CHOL/HDL Ratio 2.9 <5.0 (calc)   Non-HDL Cholesterol (Calc) 117 <130 mg/dL (calc)  Comprehensive Metabolic Panel (CMET)  Result Value Ref Range   Glucose, Bld 135 (H) 65 - 99 mg/dL   BUN 10 7 - 25 mg/dL   Creat 2.42 3.53 - 6.14 mg/dL   BUN/Creatinine Ratio SEE NOTE: 6 - 22 (calc)   Sodium 141 135 - 146 mmol/L   Potassium 4.3 3.5 - 5.3 mmol/L   Chloride 105 98 - 110 mmol/L   CO2 27 20 - 32 mmol/L   Calcium 9.6 8.6 - 10.3 mg/dL   Total Protein 6.9 6.1 - 8.1 g/dL   Albumin 4.3 3.6 - 5.1 g/dL   Globulin 2.6 1.9 - 3.7 g/dL (calc)   AG Ratio 1.7 1.0 - 2.5 (calc)   Total Bilirubin 0.5 0.2 - 1.2 mg/dL   Alkaline phosphatase (APISO) 91 35 - 144 U/L   AST 34 10 - 35 U/L   ALT 40 9 - 46 U/L  CBC with Differential  Result Value Ref Range   WBC 6.0 3.8 - 10.8 Thousand/uL   RBC 4.95 4.20 - 5.80 Million/uL   Hemoglobin 16.0 13.2 - 17.1 g/dL   HCT 43.1 54.0 - 08.6 %   MCV 96.6 80.0 - 100.0 fL   MCH 32.3 27.0 - 33.0 pg   MCHC 33.5 32.0 - 36.0 g/dL   RDW 76.1 95.0 - 93.2 %   Platelets 176 140 - 400 Thousand/uL   MPV 11.1 7.5 - 12.5 fL   Neutro Abs 2,910 1,500 - 7,800 cells/uL   Lymphs Abs 2,382 850 - 3,900 cells/uL   Absolute Monocytes 450 200 - 950 cells/uL   Eosinophils Absolute 198 15 - 500 cells/uL   Basophils Absolute 60 0 - 200 cells/uL   Neutrophils Relative % 48.5 %   Total Lymphocyte 39.7 %   Monocytes Relative 7.5 %    Eosinophils Relative 3.3 %   Basophils Relative 1.0 %      Assessment & Plan:   Problem List Items Addressed This Visit     Hyperlipidemia   Prediabetes   Other Visit Diagnoses     Annual physical exam    -  Primary       Updated Health Maintenance information Reviewed recent lab results with patient Encouraged improvement to lifestyle with diet and exercise Goal of weight loss  Weight management discussion today, with elevated A1c 6.2, PreDm still, prior range 5.6 to 5.8 range, this has been notable increase.  Would consider hold Metformin and pursue GLP1 therapy if eligible covered and interested, handout on GLP1 for weight management given to patient, may follow-up and return 3 months after trial of this for weight, BP, Triglyceride, and A1c management.  Lipids, continue Atorvastatin at this time.   No orders of the defined types were  placed in this encounter.     Follow up plan: Return in about 3 months (around 12/14/2021) for 3 month PreDM A1c, Weight f/u on med.  Nobie Putnam, DO [facility] 09/13/2021, 2:17 PM

## 2021-09-13 NOTE — Patient Instructions (Addendum)
Thank you for coming to the office today.  If start medicine can hold metformin  In future can resume metformin if indicated.   If you want to proceed, let me know on a message and we can order it.  --------------- Call insurance find cost and coverage of the following - check the following: - Drug Tier, Preferred List, On Formulary - All will require a "Prior Authorization" from [place] first, before you can find out the cost - Find out if there is "Step Therapy" (other medicines required before you can try these)  Once you pick the one you want to try, let me know - we can get a sample ready IF we have it in stock. Then try it - and before running out of medicine, contact me back to order your Rx so we have time to get it processed.  For Weight Loss / Obesity only  Wegovy (same as Ozempic) weekly injection - start 0.25mg  weekly, 1 dose per pen, single use, auto-injector  2. Saxenda - DAILY injection - start 0.6mg  injection DAILY, you can increase the dose by 1 notch or 0.6 mg per week, if you don't tolerate a dose, can reduce it the next day.  3. Contrave - oral medication, appetite suppression has wellbutrin/bupropion and naltrexone in it and it can also help with appetite, it is ordered through a speciality pharmacy.  Future make sure your insurance has weight loss coverage   Please schedule a Follow-up Appointment to: Return in about 3 months (around 12/14/2021) for 3 month PreDM A1c, Weight f/u on med.  If you have any other questions or concerns, please feel free to call the office or send a message through MyChart. You may also schedule an earlier appointment if necessary.  Additionally, you may be receiving a survey about your experience at our office within a few days to 1 week by e-mail or mail. We value your feedback.  Saralyn Pilar, DO [facility], [place]

## 2021-09-14 MED ORDER — SAXENDA 18 MG/3ML ~~LOC~~ SOPN: PEN_INJECTOR | SUBCUTANEOUS | 0 refills | Status: DC

## 2021-09-14 MED ORDER — INSULIN PEN NEEDLE 32G X 6 MM MISC: 3 refills | Status: DC

## 2021-09-20 IMAGING — RF DG SMALL BOWEL
10 of 13 series · 15 of 24 positions shown · non-contrast
Comparison: CT evaluation and abdominal plain films from Thursday November, 2019

CLINICAL DATA: History of small-bowel obstruction on prior CT
imaging.

EXAM:
SMALL BOWEL SERIES
TECHNIQUE: Following ingestion of thin barium, serial small bowel images were
obtained including spot views of the terminal ileum.
FLUOROSCOPY TIME:  Fluoroscopy Time:  1 minutes 6 seconds
Radiation Exposure Index (if provided by the fluoroscopic device):
12.4 mGy
Number of Acquired Spot Images: 9

[Series 1: cp_standard · 0.17mm/px · 1 of 9 frames shown (1 of 9)]
[frame 2/9]
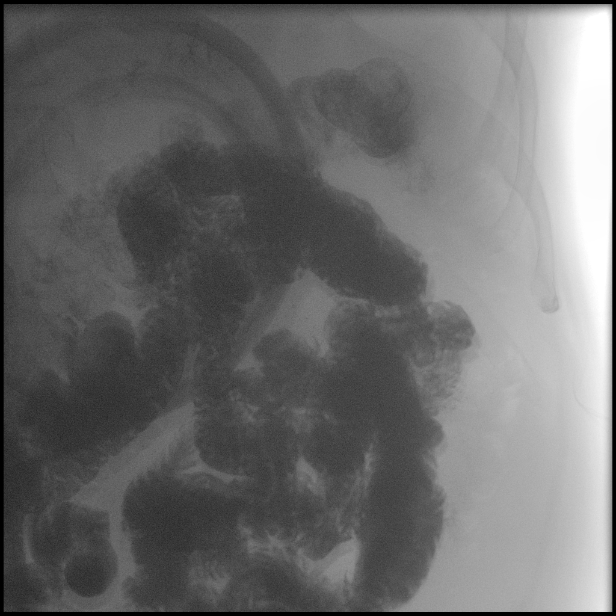

[Series 2: cp_standard · 0.17mm/px · 1 of 11 frames shown (2 of 9)]
[frame 1/11]
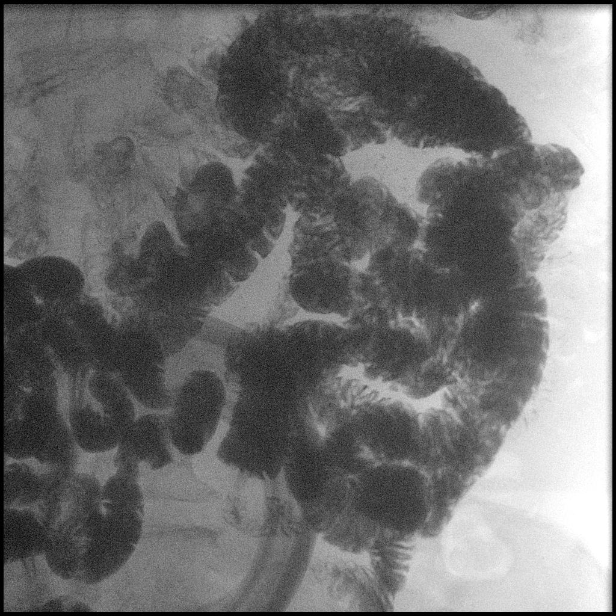

[Series 3: cp_standard · 0.17mm/px · 2 of 9 frames shown (3 of 9)]
[frame 2/9]
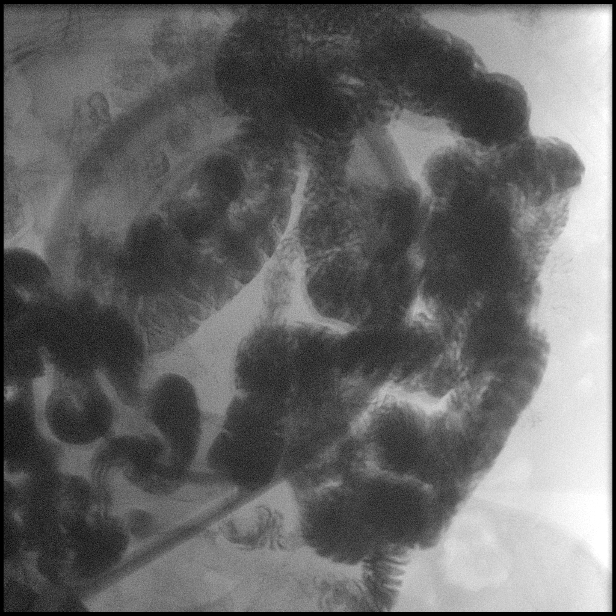
[frame 8/9]
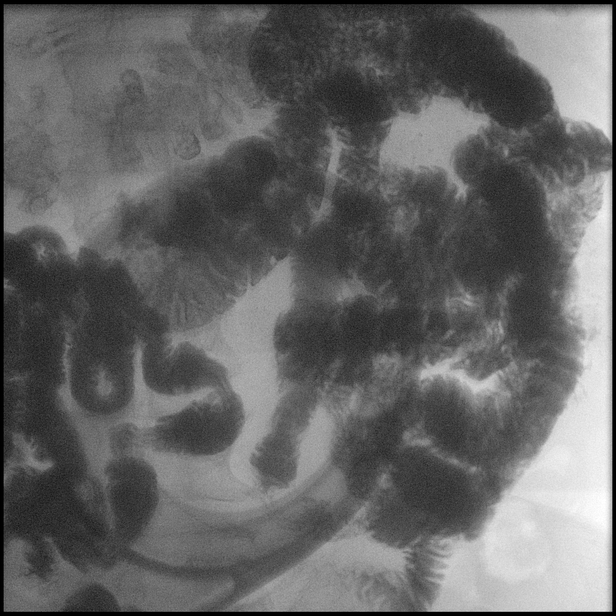

[Series 4: cp_standard · 0.17mm/px · 2 of 8 frames shown (4 of 9)]
[frame 5/8]
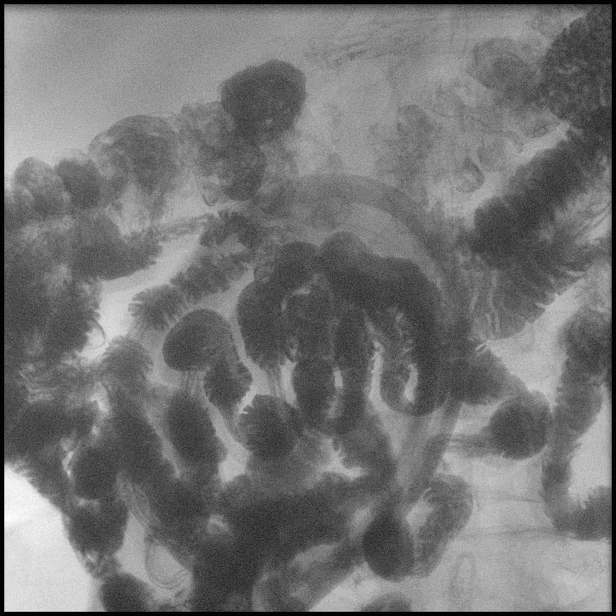
[frame 8/8]
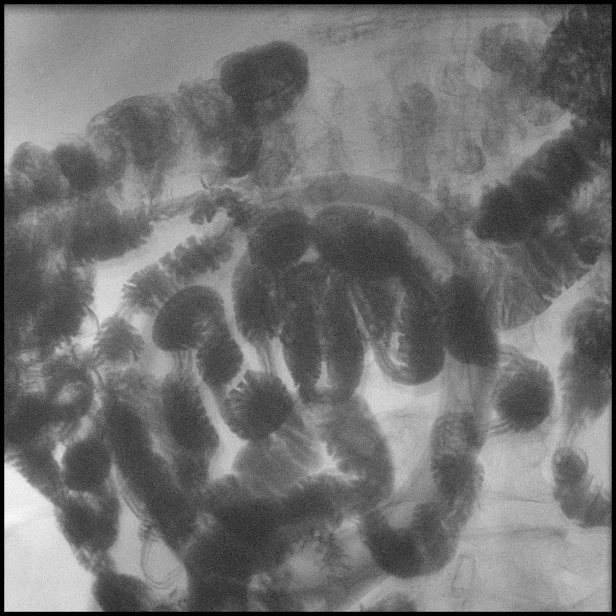

[Series 6: cp_standard · 0.17mm/px · 1 of 10 frames shown (5 of 9)]
[frame 1/10]
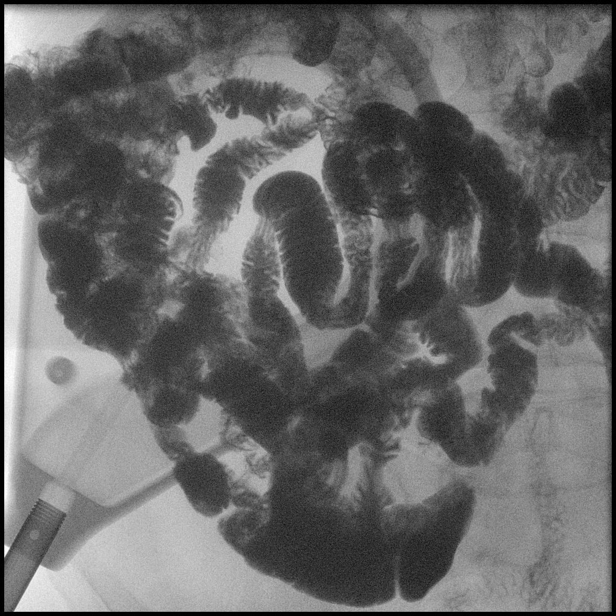

[Series 7: t abdomen barium ap · 0.14mm/px · 3 of 9 slices shown]
[im 3/9]
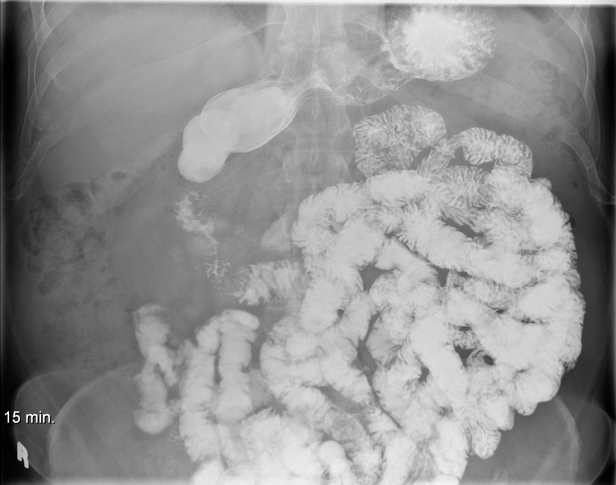
[im 4/9]
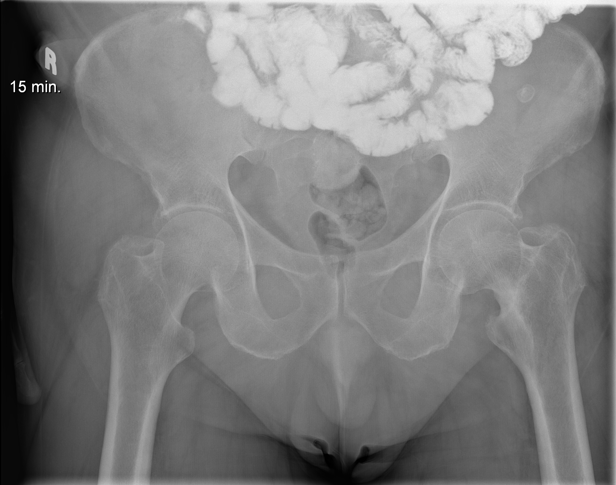
[im 8/9]
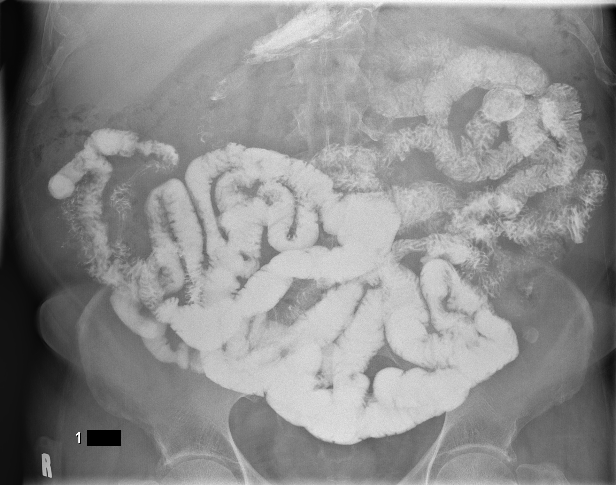

[Series 7: cp_standard · 0.17mm/px · 1 of 9 frames shown (6 of 9)]
[frame 5/9]
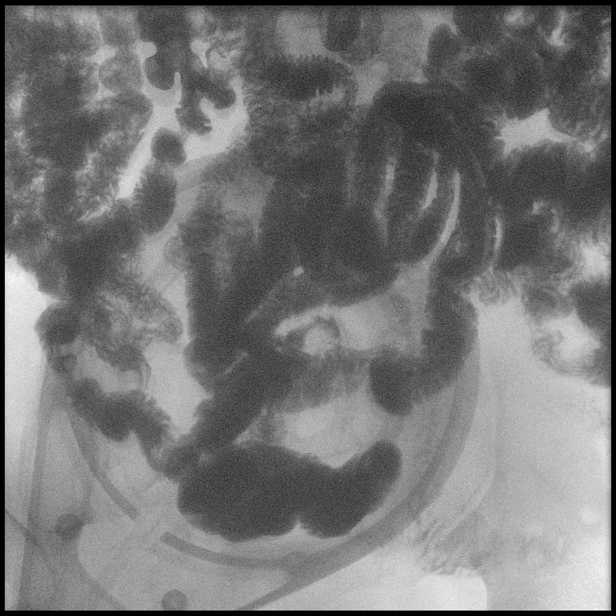

[Series 9: cp_standard · 0.17mm/px · 1 of 1 slices shown (7 of 9)]
[im 1/1]
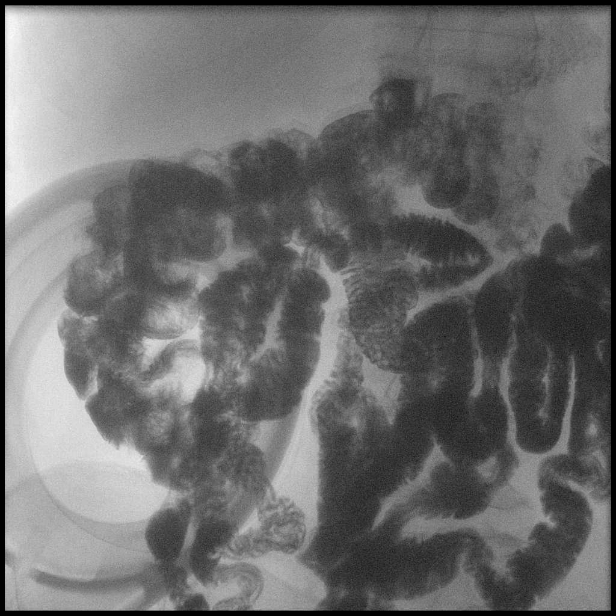

[Series 11: cp_standard · 0.17mm/px · 2 of 5 frames shown (8 of 9)]
[frame 1/5]
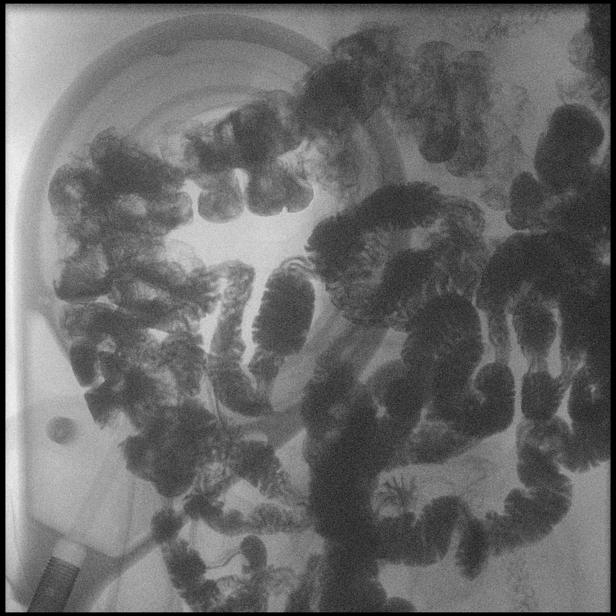
[frame 3/5]
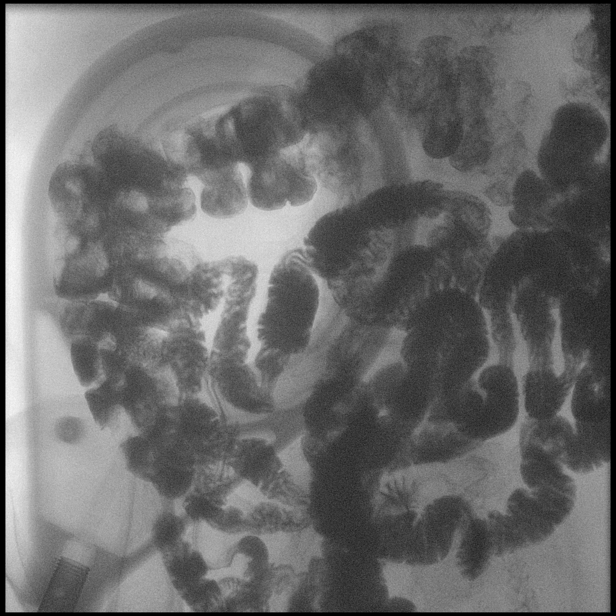

[Series 15: cp_standard · 0.18mm/px · 1 of 1 slices shown (9 of 9)]
[im 1/1]
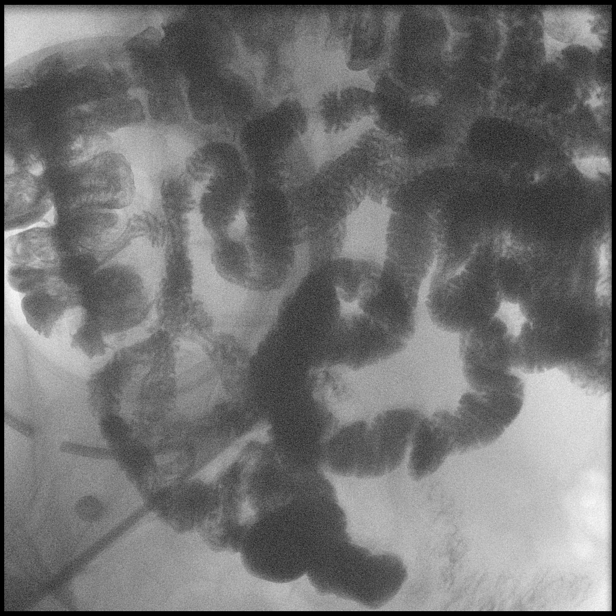

[15 of 24 positions shown; findings below may reference images not displayed]

FINDINGS: Scattered examination displays a normal bowel gas pattern.

No sign of organomegaly.

No acute bone process.

Barium contrast material was ingested. Serial imaging was obtained
over approximately 1 hour 45 minutes. Normal appearance of small
bowel without signs of focal dilation or angulation.

Terminal ileum with normal appearance.
IMPRESSION: 1. Normal small bowel evaluation.

## 2021-09-25 IMAGING — CT CT CHEST LCS NODULE FOLLOW-UP W/O CM
2 of 5 series · 15 of 40 positions shown, 18 images · non-contrast
Comparison: Low-dose lung cancer screening CT chest dated
10/29/2019

CLINICAL DATA: 59-year-old male former smoker, quit 6 years ago,
with 35 pack-year history of smoking, for short-term follow-up lung
cancer screening

EXAM:
CT CHEST WITHOUT CONTRAST FOR LUNG CANCER SCREENING NODULE FOLLOW-UP
TECHNIQUE: Multidetector CT imaging of the chest was performed following the
standard protocol without IV contrast.

[Series 3: lung lcs f/u 1.00 · axial · 0.73mm/px · z∈[-1247,-932]mm · 12 of 349 slices shown, 15 images]
[im 17/349  mediastinal]
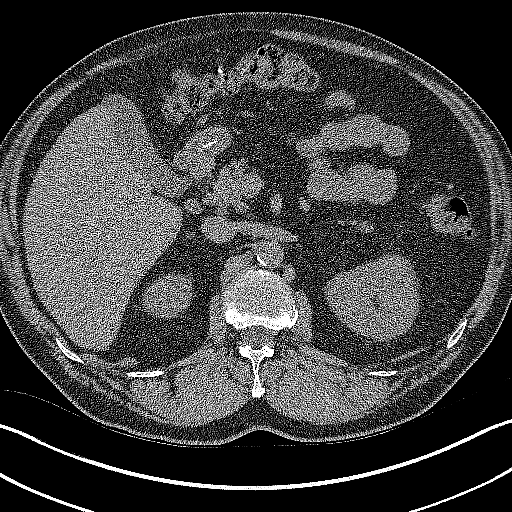
[im 17/349  lung]
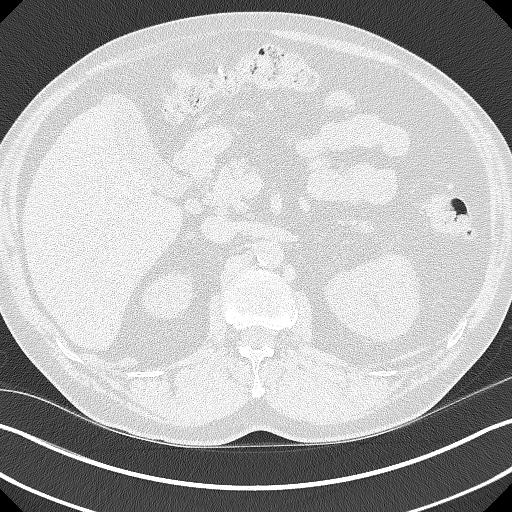
[im 50/349  lung]
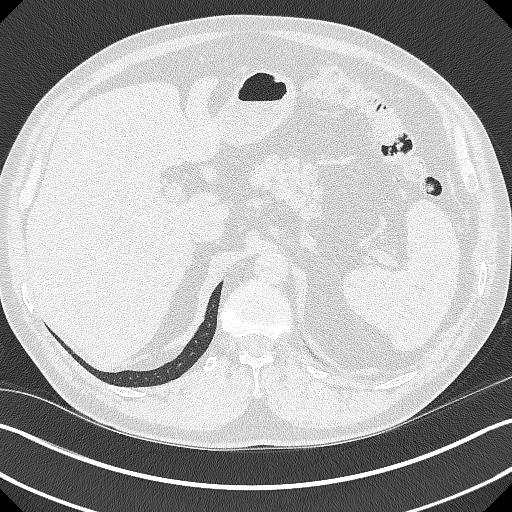
[im 83/349  lung]
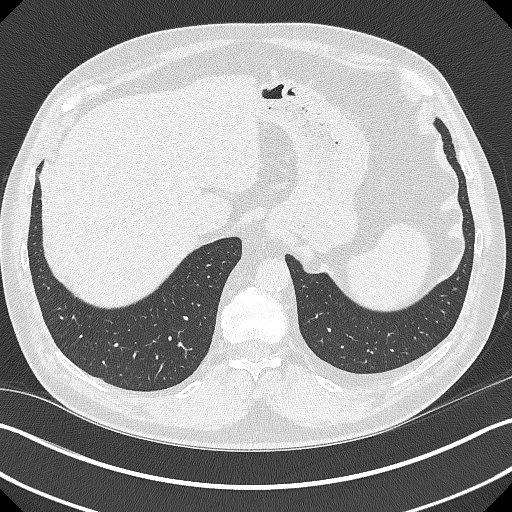
[im 100/349  lung]
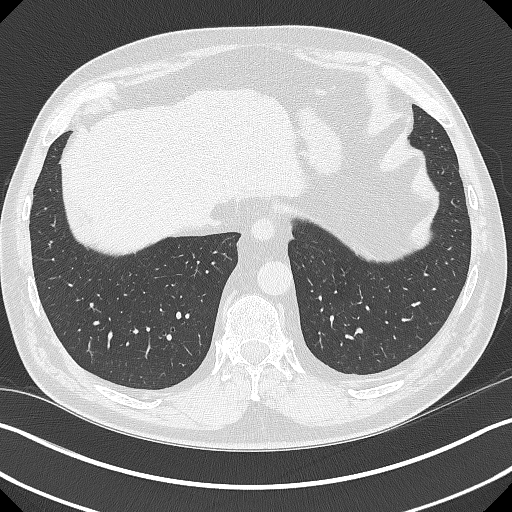
[im 133/349  mediastinal]
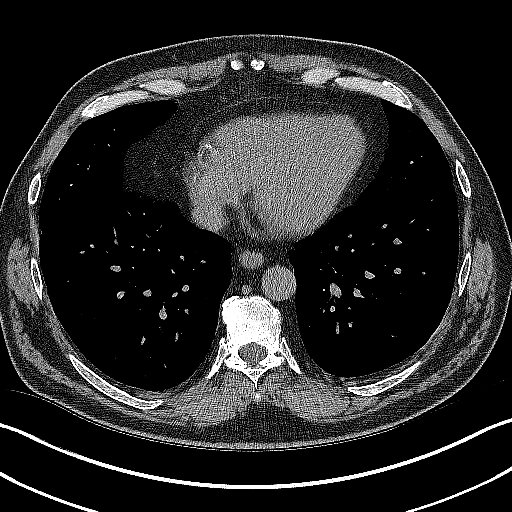
[im 133/349  lung]
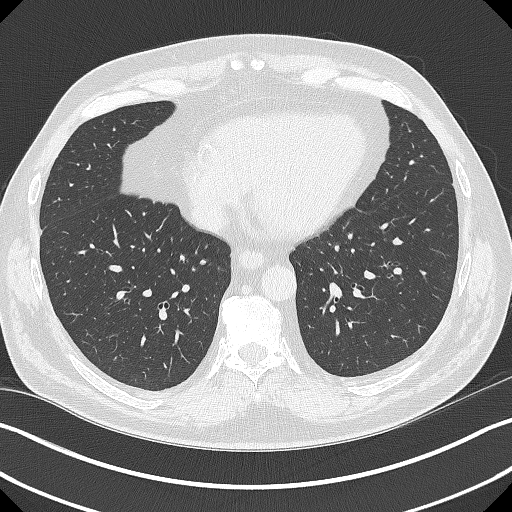
[im 166/349  lung]
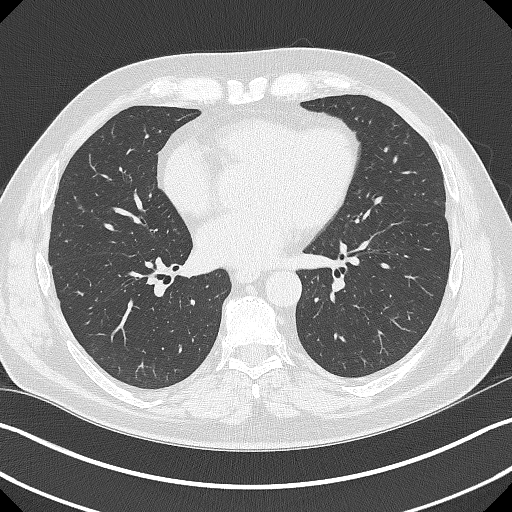
[im 183/349  lung]
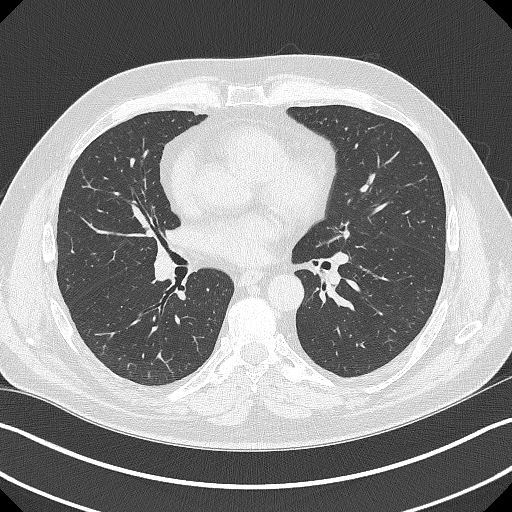
[im 216/349  lung]
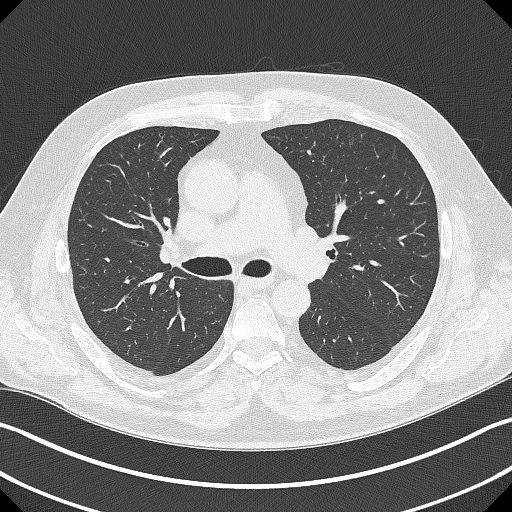
[im 249/349  mediastinal]
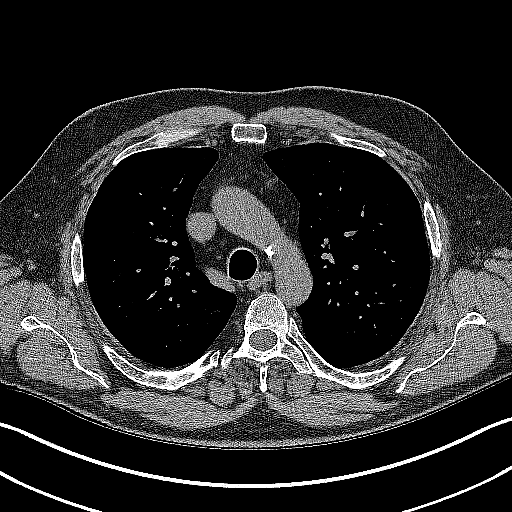
[im 249/349  lung]
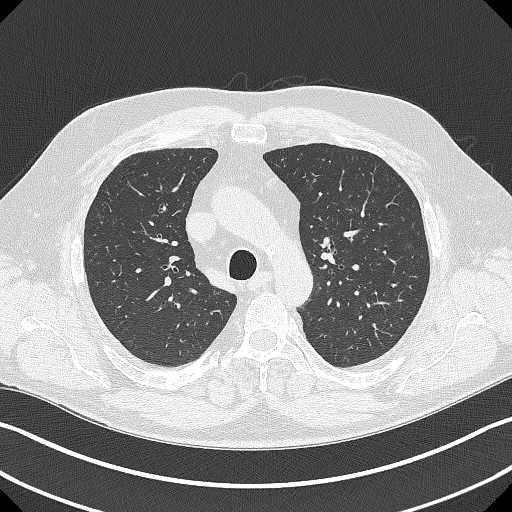
[im 266/349  lung]
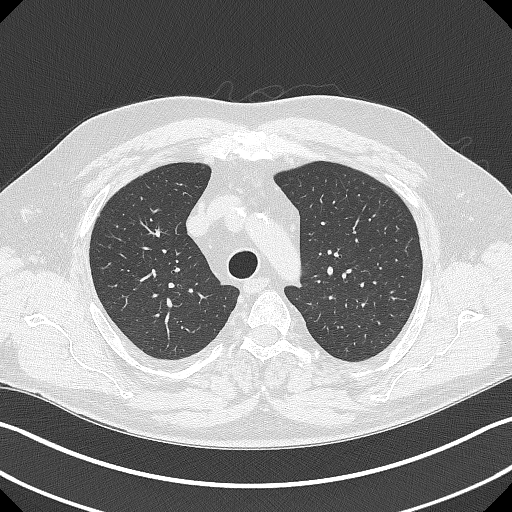
[im 299/349  lung]
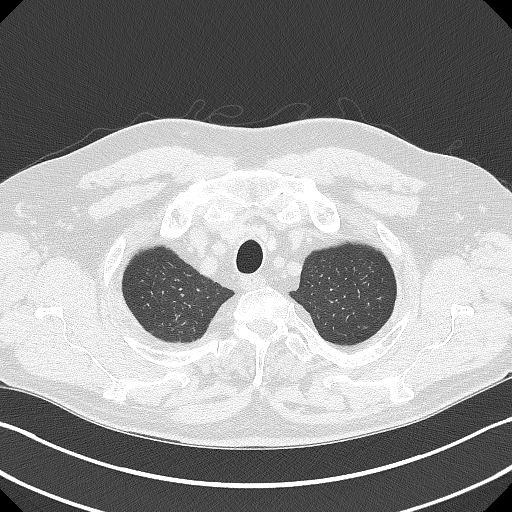
[im 332/349  lung]
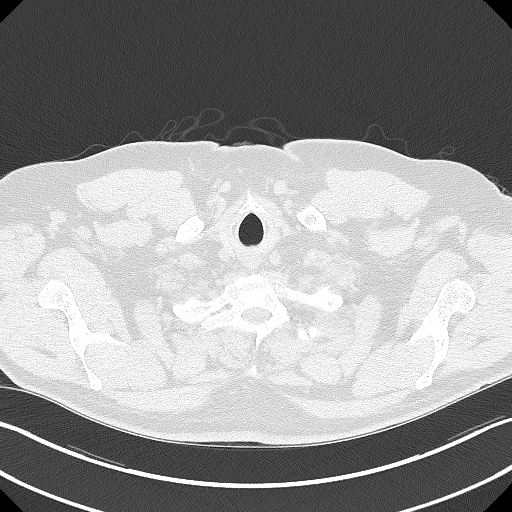

[Series 4: lcs f/u 1.00 cor · coronal · 0.68mm/px · 3 of 312 slices shown]
[im 63/312  lung]
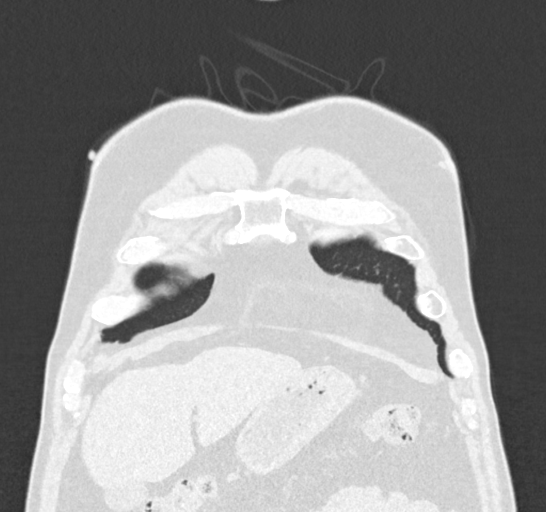
[im 125/312  lung]
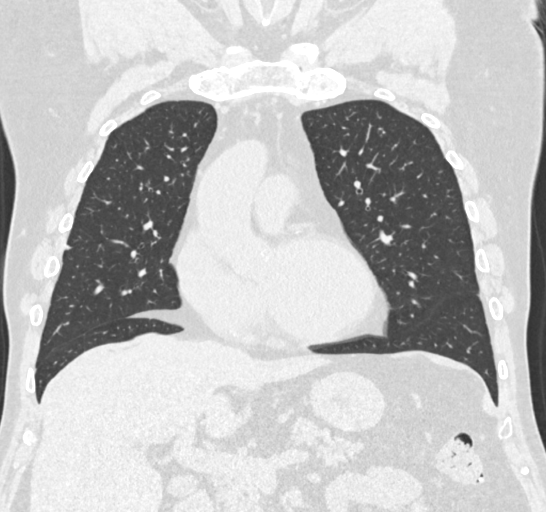
[im 187/312  lung]
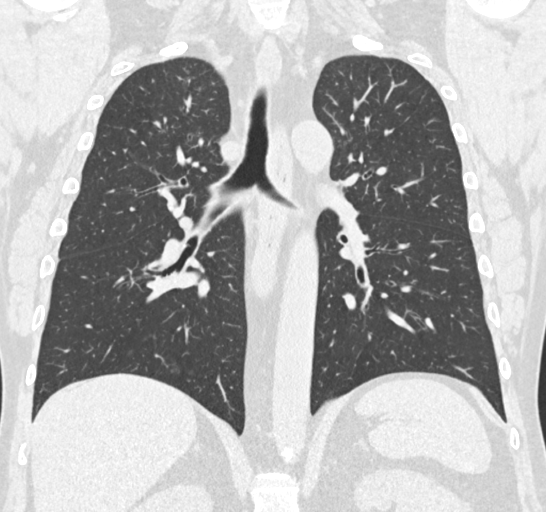

[15 of 40 positions shown; findings below may reference images not displayed]

FINDINGS: Cardiovascular: The heart is normal in size. No pericardial
effusion.

No evidence of thoracic aortic aneurysm. Atherosclerotic
calcifications of the aortic arch.

Mild coronary atherosclerosis the LAD and right circumflex.

Mediastinum/Nodes: No suspicious mediastinal lymphadenopathy.

Visualized thyroid is unremarkable.

Lungs/Pleura: No focal consolidation.

Prior new part solid and ground-glass nodules in the right lower
lobe have essentially resolved, likely infectious/inflammatory.

Scattered bilateral pulmonary nodules, including a dominant 6.9 mm
nodule in the left lower lobe, unchanged.

No pleural effusion or pneumothorax.

Upper Abdomen: Visualized upper abdomen is grossly unremarkable,
noting mild vascular calcifications and colonic diverticulosis.

Musculoskeletal: Mild degenerative changes of the visualized
thoracolumbar spine.
IMPRESSION: Lung-RADS 2, benign appearance or behavior. Continue annual
screening with low-dose chest CT without contrast in 12 months.

Aortic Atherosclerosis ([id number]-BD3.3).

## 2021-10-27 ENCOUNTER — Encounter: Payer: Self-pay | Admitting: Family Medicine

## 2021-11-01 ENCOUNTER — Ambulatory Visit: Payer: BC Managed Care – PPO | Admitting: Family Medicine

## 2021-11-01 ENCOUNTER — Encounter: Payer: Self-pay | Admitting: Family Medicine

## 2021-11-01 VITALS — BP 154/88 | HR 95 | Ht 72.0 in | Wt 224.0 lb

## 2021-11-01 DIAGNOSIS — N3001 Acute cystitis with hematuria: Secondary | ICD-10-CM

## 2021-11-01 DIAGNOSIS — I10 Essential (primary) hypertension: Secondary | ICD-10-CM | POA: Diagnosis not present

## 2021-11-01 LAB — POCT URINALYSIS DIPSTICK
Bilirubin, UA: NEGATIVE
Blood, UA: NEGATIVE
Glucose, UA: NEGATIVE
Ketones, UA: NEGATIVE
Leukocytes, UA: NEGATIVE
Nitrite, UA: NEGATIVE
Protein, UA: NEGATIVE
Spec Grav, UA: 1.015 (ref 1.010–1.025)
Urobilinogen, UA: 0.2 E.U./dL
pH, UA: 6 (ref 5.0–8.0)

## 2021-11-01 MED ORDER — SULFAMETHOXAZOLE-TRIMETHOPRIM 800-160 MG PO TABS: 1.0000 | ORAL_TABLET | Freq: Two times a day (BID) | ORAL | 0 refills | Status: AC

## 2021-11-01 NOTE — Progress Notes (Unsigned)
Subjective:    Patient ID: Jeremy [name], male    DOB: 02-10-1960, 61 y.o.   MRN: [id number]  Jeremy [name] is a 61 y.o. male presenting on 11/01/2021 for painful urination and Urinary Frequency   HPI  UTI Describes 1 week+ of symptoms with lower pelvic abdominal discomfort with aching symptoms persistent and dysuria with urinary frequency. No prior UTI No recent antibiotics  Denies fever chills nausea vomiting flank pain  HTN Concern with elevated BP Took medications today Amlodipine-Benazepril 5-10mg  daily     11/01/2021    8:19 AM 09/13/2021    5:27 PM 12/12/2018   11:31 AM  Depression screen PHQ 2/9  Decreased Interest 0 0 0  Down, Depressed, Hopeless 0 0 0  PHQ - 2 Score 0 0 0  Altered sleeping 0  0  Tired, decreased energy 0  0  Change in appetite 0  0  Feeling bad or failure about yourself  0  0  Trouble concentrating 0  0  Moving slowly or fidgety/restless 0  0  Suicidal thoughts 0  0  PHQ-9 Score 0  0  Difficult doing work/chores Not difficult at all      Social History   Tobacco Use   Smoking status: Former    Packs/day: 1.00    Years: 35.00    Total pack years: 35.00    Types: Cigarettes    Quit date: 01/06/2014    Years since quitting: 7.8   Smokeless tobacco: Former  Scientific laboratory technician Use: Never used  Substance Use Topics   Alcohol use: Yes    Alcohol/week: 6.0 standard drinks of alcohol    Types: 6 Cans of beer per week   Drug use: No    Review of Systems Per HPI unless specifically indicated above     Objective:    BP (!) 154/88 (BP Location: Left Arm, Cuff Size: Normal)   Pulse 95   Ht 6' (1.829 m)   Wt 224 lb (101.6 kg)   SpO2 99%   BMI 30.38 kg/m   Wt Readings from Last 3 Encounters:  11/01/21 224 lb (101.6 kg)  09/13/21 222 lb (100.7 kg)  08/26/21 220 lb (99.8 kg)    Physical Exam Vitals and nursing note reviewed.  Constitutional:      General: He is not in acute distress.    Appearance: Normal appearance. He is  well-developed. He is not diaphoretic.     Comments: Well-appearing, comfortable, cooperative  HENT:     Head: Normocephalic and atraumatic.  Eyes:     General:        Right eye: No discharge.        Left eye: No discharge.     Conjunctiva/sclera: Conjunctivae normal.  Cardiovascular:     Rate and Rhythm: Normal rate.  Pulmonary:     Effort: Pulmonary effort is normal.  Skin:    General: Skin is warm and dry.     Findings: No erythema or rash.  Neurological:     Mental Status: He is alert and oriented to person, place, and time.  Psychiatric:        Mood and Affect: Mood normal.        Behavior: Behavior normal.        Thought Content: Thought content normal.     Comments: Well groomed, good eye contact, normal speech and thoughts       Results for orders placed or performed in visit on 11/01/21  POCT urinalysis dipstick  Result Value Ref Range   Color, UA Yellow    Clarity, UA Clear    Glucose, UA Negative Negative   Bilirubin, UA Negative    Ketones, UA Negative    Spec Grav, UA 1.015 1.010 - 1.025   Blood, UA Negative    pH, UA 6.0 5.0 - 8.0   Protein, UA Negative Negative   Urobilinogen, UA 0.2 0.2 or 1.0 E.U./dL   Nitrite, UA Negative    Leukocytes, UA Negative Negative   Appearance     Odor        Assessment & Plan:   Problem List Items Addressed This Visit     Essential (primary) hypertension   Other Visit Diagnoses     Acute cystitis with hematuria    -  Primary   Relevant Medications   sulfamethoxazole-trimethoprim (BACTRIM DS) 800-160 MG tablet   Other Relevant Orders   Urine Culture   POCT urinalysis dipstick (Completed)       Elevated Blood pressure Improved on re-check WIll monitor at home, and re-address at future follow-up, can adjust titrate BP medication if still needed  UTI History and urine supportive  - Start Bactrim double strength twice daily for next 7 days, complete entire course, even if feeling better - We sent urine  for a culture, we will call you within next few days if we need to change antibiotics - Please drink plenty of fluids, improve hydration over next 1 week    Meds ordered this encounter  Medications   sulfamethoxazole-trimethoprim (BACTRIM DS) 800-160 MG tablet    Sig: Take 1 tablet by mouth 2 (two) times daily for 7 days.    Dispense:  14 tablet    Refill:  0     Follow up plan: Return in about 3 months (around 01/31/2022) for 3 month follow-up PreDM A1c, HTN, Wt med?Marland Kitchen   Saralyn Pilar, DO [facility] 11/01/2021, 8:13 AM

## 2021-11-01 NOTE — Patient Instructions (Addendum)
Thank you for coming to the office today.  1. You have a Urinary Tract Infection - this is very common, your symptoms are reassuring and you should get better within 1 week on the antibiotics - Start Bactrim double strength twice daily for next 7 days, complete entire course, even if feeling better - We sent urine for a culture, we will call you within next few days if we need to change antibiotics - Please drink plenty of fluids, improve hydration over next 1 week  If symptoms worsening, developing nausea / vomiting, worsening back pain, fevers / chills / sweats, then please return for re-evaluation sooner.  If you take AZO OTC - limit this to 2-3 days MAX to avoid affecting kidneys  D-Mannose is a natural supplement that can actually help bind to urinary bacteria and reduce their effectiveness it can help prevent UTI from forming, and may reduce some symptoms. It likely cannot cure an active UTI but it is worth a try and good to prevent them with. Try 500mg  twice a day at a full dose if you want, or check package instructions for more info   Please schedule a Follow-up Appointment to: Return in about 3 months (around 01/31/2022) for 3 month follow-up PreDM A1c, HTN, Wt med?.  If you have any other questions or concerns, please feel free to call the office or send a message through [place]. You may also schedule an earlier appointment if necessary.  Additionally, you may be receiving a survey about your experience at our office within a few days to 1 week by e-mail or mail. We value your feedback.  Jeremy Putnam, DO [place]

## 2021-11-02 LAB — URINE CULTURE
MICRO NUMBER:: 13969626
Result:: NO GROWTH
SPECIMEN QUALITY:: ADEQUATE

## 2022-01-05 ENCOUNTER — Encounter: Payer: Self-pay | Admitting: Family Medicine

## 2022-01-23 ENCOUNTER — Other Ambulatory Visit: Payer: Self-pay | Admitting: Family Medicine

## 2022-01-23 DIAGNOSIS — E785 Hyperlipidemia, unspecified: Secondary | ICD-10-CM

## 2022-01-23 DIAGNOSIS — R7303 Prediabetes: Secondary | ICD-10-CM

## 2022-01-23 DIAGNOSIS — I10 Essential (primary) hypertension: Secondary | ICD-10-CM

## 2022-01-24 NOTE — Telephone Encounter (Signed)
Requested Prescriptions  Pending Prescriptions Disp Refills   amLODipine-benazepril (LOTREL) 5-10 MG capsule [Pharmacy Med Name: AMLODIPINE-BENAZEPRIL 5-10 MG] 90 capsule 0    Sig: TAKE 1 CAPSULE BY MOUTH EVERY DAY     Cardiovascular: CCB + ACEI Combos Failed - 01/23/2022 12:40 AM      Failed - eGFR is 30 or above and within 180 days    GFR, Est African American  Date Value Ref Range Status  06/06/2019 122 > OR = 60 mL/min/1.69m Final   GFR, Est Non African American  Date Value Ref Range Status  06/06/2019 105 > OR = 60 mL/min/1.778mFinal   GFR, Estimated  Date Value Ref Range Status  03/08/2021 >60 >60 mL/min Final    Comment:    (NOTE) Calculated using the CKD-EPI Creatinine Equation (2021)          Failed - Last BP in normal range    BP Readings from Last 1 Encounters:  11/01/21 (!) 154/88         Passed - Cr in normal range and within 180 days    Creat  Date Value Ref Range Status  09/06/2021 0.78 0.70 - 1.35 mg/dL Final         Passed - K in normal range and within 180 days    Potassium  Date Value Ref Range Status  09/06/2021 4.3 3.5 - 5.3 mmol/L Final         Passed - Na in normal range and within 180 days    Sodium  Date Value Ref Range Status  09/06/2021 141 135 - 146 mmol/L Final  01/07/2015 140 136 - 144 mmol/L Final    Comment:    **Effective January 18, 2015 the reference interval**   for Sodium, Serum will be changing to:                                             134 - 144          Passed - Patient is not pregnant      Passed - Valid encounter within last 6 months    Recent Outpatient Visits           2 months ago [facility] with hematuria   [facility]   4 months ago Annual physical exam   [place]   1 year ago Blepharitis of right upper eyelid, unspecified type   [place]   2 years ago  Prediabetes   [facility]   2 [facility]               metFORMIN (GLUCOPHAGE) 500 MG tablet [Pharmacy Med Name: METFORMIN HCL 500 MG TABLET] 90 tablet 0    Sig: TAKE 1 TABLET BY MOUTH EVERY DAY WITH BREAKFAST     Endocrinology:  Diabetes - Biguanides Failed - 01/23/2022 12:40 AM      Failed - B12 Level in normal range and within 720 days    No results found for: "VITAMINB12"       Passed - Cr in normal range and within 360 days    Creat  Date Value Ref Range Status  09/06/2021 0.78 0.70 - 1.35 mg/dL Final         Passed - HBA1C is between 0 and 7.9 and within 180 days    Hgb A1c MFr Bld  Date Value Ref Range Status  09/06/2021 6.2 (H) <5.7 % of total Hgb Final    Comment:    For someone without known diabetes, a hemoglobin  A1c value between 5.7% and 6.4% is consistent with prediabetes and should be confirmed with a  follow-up test. . For someone with known diabetes, a value <7% indicates that their diabetes is well controlled. A1c targets should be individualized based on duration of diabetes, age, comorbid conditions, and other considerations. . This assay result is consistent with an increased risk of diabetes. . Currently, no consensus exists regarding use of hemoglobin A1c for diagnosis of diabetes for children. .          Passed - eGFR in normal range and within 360 days    GFR, Est African American  Date Value Ref Range Status  06/06/2019 122 > OR = 60 mL/min/1.28m Final   GFR, Est Non African American  Date Value Ref Range Status  06/06/2019 105 > OR = 60 mL/min/1.76mFinal   GFR, Estimated  Date Value Ref Range Status  03/08/2021 >60 >60 mL/min Final    Comment:    (NOTE) Calculated using the CKD-EPI Creatinine Equation (2021)          Passed - Valid encounter within last 6 months    Recent Outpatient  Visits           2 months ago Acute cystitis with hematuria   [place]   4 months ago Annual physical exam   [place]   1 year ago Blepharitis of right upper eyelid, unspecified type   [place]   2 years ago Prediabetes   [facility]   [facility]  [place]              Passed - CBC within normal limits and completed in the last 12 months    WBC  Date Value Ref Range Status  09/06/2021 6.0 3.8 - 10.8 Thousand/uL Final   RBC  Date Value Ref Range Status  09/06/2021 4.95 4.20 - 5.80 Million/uL Final   Hemoglobin  Date Value Ref Range Status  09/06/2021 16.0 13.2 - 17.1 g/dL Final  01/07/2015 16.7 12.6 - 17.7 g/dL Final   HCT  Date Value Ref Range Status  09/06/2021 47.8 38.5 - 50.0 % Final   Hematocrit  Date Value Ref Range Status  01/07/2015 47.4 37.5 - 51.0 % Final   MCHC  Date Value Ref Range Status  09/06/2021 33.5 32.0 - 36.0 g/dL Final   [facility] Range Status  09/06/2021 32.3 27.0 - 33.0 pg Final   MCV  Date Value Ref Range Status  09/06/2021 96.6 80.0 - 100.0 fL Final  01/07/2015 93 79 - 97 fL Final   No results found for: "PLTCOUNTKUC", "LABPLAT", "POCPLA" RDW  Date Value Ref Range Status  09/06/2021 12.1 11.0 - 15.0 % Final  01/07/2015 12.9 12.3 - 15.4 % Final          atorvastatin (LIPITOR) 20 MG tablet [Pharmacy Med Name: ATORVASTATIN 20 MG TABLET] 90 tablet  0    Sig: TAKE 1 TABLET BY MOUTH EVERY DAY     Cardiovascular:  Antilipid - Statins Failed - 01/23/2022 12:40 AM      Failed - Lipid Panel in normal range within the last 12 months    Cholesterol, Total  Date Value Ref Range Status  01/07/2015 216 (H) 100 - 199 mg/dL Final   Cholesterol  Date Value  Ref Range Status  09/06/2021 179 <200 mg/dL Final   LDL Cholesterol (Calc)  Date Value Ref Range Status  09/06/2021 92 mg/dL (calc) Final    Comment:    Reference range: <100 . Desirable range <100 mg/dL for primary prevention;   <70 mg/dL for patients with CHD or diabetic patients  with > or = 2 CHD risk factors. Marland Kitchen LDL-C is now calculated using the Martin-Hopkins  calculation, which is a validated novel method providing  better accuracy than the Friedewald equation in the  estimation of LDL-C.  Cresenciano Genre et al. Annamaria Helling. 8338;250(53): 2061-2068  (http://education.[employer].com/faq/FAQ164)    HDL  Date Value Ref Range Status  09/06/2021 62 > OR = 40 mg/dL Final  01/07/2015 58 >39 mg/dL Final   Triglycerides  Date Value Ref Range Status  09/06/2021 152 (H) <150 mg/dL Final         Passed - Patient is not pregnant      Passed - Valid encounter within last 12 months    Recent Outpatient Visits           2 months ago Acute cystitis with hematuria   [place], DO   4 months ago Annual physical exam   [place], DO   1 year ago Blepharitis of right upper eyelid, unspecified type   [facility] Hauser, DO   2 years ago Prediabetes   [place], DO   2 years ago SBO (small bowel obstruction) [facility])   [place], DO

## 2022-02-16 ENCOUNTER — Ambulatory Visit: Payer: Self-pay | Admitting: *Deleted

## 2022-02-16 NOTE — Telephone Encounter (Signed)
Summary: Temperature ( 100) Cough, congestion Advice   Pt is calling to report sx of  temperature of 101, cough, congestion for 4 days. Negative for COVID. No available appts today. Pt is open to discuss scheduling virtual appt with [facility]. Please advise       Chief Complaint: fever, cough  Symptoms: temp 100.1 this am at 8am. X 2 days. Cough x 4 days. Negative at home covid test today. Has been taking tylenol and OTC dayquil guaifenesin for sx. Sore throat diarrhea. Hx prediabetes takes medication Frequency: 4 days  Pertinent Negatives: Patient denies chest pain no difficulty breathing fever decreased with tylenol but returns.  Disposition: [] ED /[] Urgent Care (no appt availability in office) / [x] Appointment(In office/virtual)/ []  [place] [facility]/ [] Home Care/ [] Refused Recommended Disposition /[] [place] [facility]/ []  Follow-up with PCP Additional Notes:   No appt with PCP. Scheduled same day appt tomorrow with Avie Echevaria, NP. Please advise if earlier appt available.       Reason for Disposition  [1] Fever > 100.0 F (37.8 C) AND [2] diabetes mellitus or weak immune system (e.g., HIV positive, cancer chemo, splenectomy, organ transplant, chronic steroids)    Prediabetic  Answer Assessment - Initial Assessment Questions 1. TEMPERATURE: "What is the most recent temperature?"  "How was it measured?"      100.1 this am  2. ONSET: "When did the fever start?"      2 days 3. CHILLS: "Do you have chills?" If yes: "How bad are they?"  (e.g., none, mild, moderate, severe)   - NONE: no chills   - MILD: feeling cold   - MODERATE: feeling very cold, some shivering (feels better under a thick blanket)   - SEVERE: feeling extremely cold with shaking chills (general body shaking, rigors; even under a thick blanket)      Wake up sweating and chills  4. OTHER SYMPTOMS: "Do you have any other symptoms besides the fever?"  (e.g., abdomen pain, cough, diarrhea, earache, headache, sore  throat, urination pain)     Sore throat diarrhea, cough , hx prediabetes 5. CAUSE: If there are no symptoms, ask: "What do you think is causing the fever?"      No  6. CONTACTS: "Does anyone else in the family have an infection?"     No  7. TREATMENT: "What have you done so far to treat this fever?" (e.g., medications)     Tylenol, dayquil, guaifenesin, 8. IMMUNOCOMPROMISE: "Do you have of the following: diabetes, HIV positive, splenectomy, cancer chemotherapy, chronic steroid treatment, transplant patient, etc."     Prediabetic  9. PREGNANCY: "Is there any chance you are pregnant?" "When was your last menstrual period?"     na 10. TRAVEL: "Have you traveled out of the country in the last month?" (e.g., travel history, exposures)       na  Protocols used: [facility]

## 2022-02-17 ENCOUNTER — Ambulatory Visit: Payer: BC Managed Care – PPO | Admitting: Internal Medicine

## 2022-04-03 ENCOUNTER — Encounter (INDEPENDENT_AMBULATORY_CARE_PROVIDER_SITE_OTHER): Payer: BC Managed Care – PPO | Admitting: Family Medicine

## 2022-04-03 DIAGNOSIS — J0110 Acute frontal sinusitis, unspecified: Secondary | ICD-10-CM | POA: Diagnosis not present

## 2022-04-03 DIAGNOSIS — H6992 Unspecified Eustachian tube disorder, left ear: Secondary | ICD-10-CM

## 2022-04-03 DIAGNOSIS — J302 Other seasonal allergic rhinitis: Secondary | ICD-10-CM

## 2022-04-04 MED ORDER — AZITHROMYCIN 250 MG PO TABS: ORAL_TABLET | ORAL | 0 refills | Status: DC

## 2022-04-04 MED ORDER — FLUTICASONE PROPIONATE 50 MCG/ACT NA SUSP: 2.0000 | Freq: Every day | NASAL | 1 refills | Status: AC

## 2022-04-04 NOTE — Telephone Encounter (Signed)
Please see the MyChart message reply(ies) for my assessment and plan.    This patient gave consent for this Medical Advice Message and is aware that it may result in a bill to [employer], as well as the possibility of receiving a bill for a co-payment or deductible. They are an established patient, but are not seeking medical advice exclusively about a problem treated during an in person or video visit in the last seven days. I did not recommend an in person or video visit within seven days of my reply.    I spent a total of 8 minutes cumulative time within 7 days through [employer].  Nobie Putnam, DO

## 2022-04-05 ENCOUNTER — Encounter: Payer: Self-pay | Admitting: Family Medicine

## 2022-04-23 ENCOUNTER — Other Ambulatory Visit: Payer: Self-pay | Admitting: Family Medicine

## 2022-04-23 DIAGNOSIS — E785 Hyperlipidemia, unspecified: Secondary | ICD-10-CM

## 2022-04-23 DIAGNOSIS — I10 Essential (primary) hypertension: Secondary | ICD-10-CM

## 2022-04-23 DIAGNOSIS — R7303 Prediabetes: Secondary | ICD-10-CM

## 2022-04-24 NOTE — Telephone Encounter (Signed)
Requested Prescriptions  Pending Prescriptions Disp Refills   amLODipine-benazepril (LOTREL) 5-10 MG capsule [Pharmacy Med Name: AMLODIPINE-BENAZEPRIL 5-10 MG] 90 capsule 0    Sig: TAKE 1 CAPSULE BY MOUTH EVERY DAY     Cardiovascular: CCB + ACEI Combos Failed - 04/23/2022  8:39 AM      Failed - Cr in normal range and within 180 days    Creat  Date Value Ref Range Status  09/06/2021 0.78 0.70 - 1.35 mg/dL Final         Failed - K in normal range and within 180 days    Potassium  Date Value Ref Range Status  09/06/2021 4.3 3.5 - 5.3 mmol/L Final         Failed - Na in normal range and within 180 days    Sodium  Date Value Ref Range Status  09/06/2021 141 135 - 146 mmol/L Final  01/07/2015 140 136 - 144 mmol/L Final    Comment:    **Effective January 18, 2015 the reference interval**   for Sodium, Serum will be changing to:                                             134 - 144          Failed - eGFR is 30 or above and within 180 days    GFR, Est African American  Date Value Ref Range Status  06/06/2019 122 > OR = 60 mL/min/1.38m2 Final   GFR, Est Non African American  Date Value Ref Range Status  06/06/2019 105 > OR = 60 mL/min/1.28m2 Final   GFR, Estimated  Date Value Ref Range Status  03/08/2021 >60 >60 mL/min Final    Comment:    (NOTE) Calculated using the CKD-EPI Creatinine Equation (2021)          Failed - Last BP in normal range    BP Readings from Last 1 Encounters:  11/01/21 (!) 154/88         Passed - Patient is not pregnant      Passed - Valid encounter within last 6 months    Recent Outpatient Visits           5 months ago Acute cystitis with hematuria   [place], DO   7 months ago Annual physical exam   [facility] O[place] Hauser, DO   2 years ago Blepharitis of right upper eyelid, unspecified type   [place], Devonne Doughty, DO   2 years ago Prediabetes   [facility] [place], Devonne Doughty, DO   2 years ago SBO (small bowel obstruction) [facility])   [facility] [place], Devonne Doughty, DO               metFORMIN (GLUCOPHAGE) 500 MG tablet [Pharmacy Med Name: METFORMIN HCL 500 MG TABLET] 90 tablet 0    Sig: TAKE 1 TABLET BY MOUTH EVERY DAY WITH BREAKFAST     Endocrinology:  Diabetes - Biguanides Failed - 04/23/2022  8:39 AM      Failed - HBA1C is between 0 and 7.9 and within 180 days    Hgb A1c MFr Bld  Date Value Ref Range Status  09/06/2021 6.2 (H) <5.7 % of total Hgb  Final    Comment:    For someone without known diabetes, a hemoglobin  A1c value between 5.7% and 6.4% is consistent with prediabetes and should be confirmed with a  follow-up test. . For someone with known diabetes, a value <7% indicates that their diabetes is well controlled. A1c targets should be individualized based on duration of diabetes, age, comorbid conditions, and other considerations. . This assay result is consistent with an increased risk of diabetes. . Currently, no consensus exists regarding use of hemoglobin A1c for diagnosis of diabetes for children. .          Failed - eGFR in normal range and within 360 days    GFR, Est African American  Date Value Ref Range Status  06/06/2019 122 > OR = 60 mL/min/1.74m2 Final   GFR, Est Non African American  Date Value Ref Range Status  06/06/2019 105 > OR = 60 mL/min/1.82m2 Final   GFR, Estimated  Date Value Ref Range Status  03/08/2021 >60 >60 mL/min Final    Comment:    (NOTE) Calculated using the CKD-EPI Creatinine Equation (2021)          Failed - B12 Level in normal range and within 720 days    No results found for: "VITAMINB12"       Passed - Cr in normal range and within 360 days    Creat  Date Value Ref Range Status  09/06/2021 0.78 0.70 - 1.35 mg/dL Final          Passed - Valid encounter within last 6 months    Recent Outpatient Visits           5 months ago Acute cystitis with hematuria   [place], Devonne Doughty, DO   7 months ago Annual physical exam   [facility] Olin Hauser, DO   2 years ago Blepharitis of right upper eyelid, unspecified type   [place], Devonne Doughty, DO   2 years ago Prediabetes   [facility] Olin Hauser, DO   2 years ago SBO (small bowel obstruction) [facility])   [facility] [place], Devonne Doughty, DO              Passed - CBC within normal limits and completed in the last 12 months    WBC  Date Value Ref Range Status  09/06/2021 6.0 3.8 - 10.8 Thousand/uL Final   RBC  Date Value Ref Range Status  09/06/2021 4.95 4.20 - 5.80 Million/uL Final   Hemoglobin  Date Value Ref Range Status  09/06/2021 16.0 13.2 - 17.1 g/dL Final  01/07/2015 16.7 12.6 - 17.7 g/dL Final   HCT  Date Value Ref Range Status  09/06/2021 47.8 38.5 - 50.0 % Final   Hematocrit  Date Value Ref Range Status  01/07/2015 47.4 37.5 - 51.0 % Final   MCHC  Date Value Ref Range Status  09/06/2021 33.5 32.0 - 36.0 g/dL Final   [facility]  Date Value Ref Range Status  09/06/2021 32.3 27.0 - 33.0 pg Final   MCV  Date Value Ref Range Status  09/06/2021 96.6 80.0 - 100.0 fL Final  01/07/2015 93 79 - 97 fL Final   No results found for: "PLTCOUNTKUC", "LABPLAT", "POCPLA" RDW  Date Value Ref Range Status  09/06/2021 12.1 11.0 - 15.0 % Final  01/07/2015 12.9 12.3 - 15.4 % Final  atorvastatin (LIPITOR) 20 MG tablet [Pharmacy Med Name: ATORVASTATIN 20 MG TABLET] 90 tablet 1    Sig: TAKE 1 TABLET BY MOUTH EVERY DAY     Cardiovascular:  Antilipid - Statins Failed - 04/23/2022  8:39 AM      Failed - Lipid Panel in normal range within the last 12 months     Cholesterol, Total  Date Value Ref Range Status  01/07/2015 216 (H) 100 - 199 mg/dL Final   Cholesterol  Date Value Ref Range Status  09/06/2021 179 <200 mg/dL Final   LDL Cholesterol (Calc)  Date Value Ref Range Status  09/06/2021 92 mg/dL (calc) Final    Comment:    Reference range: <100 . Desirable range <100 mg/dL for primary prevention;   <70 mg/dL for patients with CHD or diabetic patients  with > or = 2 CHD risk factors. Marland Kitchen LDL-C is now calculated using the Martin-Hopkins  calculation, which is a validated novel method providing  better accuracy than the Friedewald equation in the  estimation of LDL-C.  Cresenciano Genre et al. Annamaria Helling. [id number]): 2061-2068  (http://education.[facility].com/faq/FAQ164)    HDL  Date Value Ref Range Status  09/06/2021 62 > OR = 40 mg/dL Final  01/07/2015 58 >39 mg/dL Final   Triglycerides  Date Value Ref Range Status  09/06/2021 152 (H) <150 mg/dL Final         Passed - Patient is not pregnant      Passed - Valid encounter within last 12 months    Recent Outpatient Visits           5 months ago Acute cystitis with hematuria   [place], DO   7 months ago Annual physical exam   [facility] Olin Hauser, DO   2 years ago Blepharitis of right upper eyelid, unspecified type   [place], [place], DO   2 years ago Prediabetes   [facility] Olin Hauser, DO   2 years ago SBO (small bowel obstruction) [facility])   [place], [place]

## 2022-04-24 NOTE — Telephone Encounter (Signed)
Requested medication (s) are due for refill today: yes  Requested medication (s) are on the active medication list: yes  Last refill:  01/24/22 #90/0  Future visit scheduled: no  Notes to clinic:  Unable to refill per protocol due to failed labs, no updated results.    Requested Prescriptions  Pending Prescriptions Disp Refills   amLODipine-benazepril (LOTREL) 5-10 MG capsule [Pharmacy Med Name: AMLODIPINE-BENAZEPRIL 5-10 MG] 90 capsule 0    Sig: TAKE 1 CAPSULE BY MOUTH EVERY DAY     Cardiovascular: CCB + ACEI Combos Failed - 04/23/2022  8:39 AM      Failed - Cr in normal range and within 180 days    Creat  Date Value Ref Range Status  09/06/2021 0.78 0.70 - 1.35 mg/dL Final         Failed - K in normal range and within 180 days    Potassium  Date Value Ref Range Status  09/06/2021 4.3 3.5 - 5.3 mmol/L Final         Failed - Na in normal range and within 180 days    Sodium  Date Value Ref Range Status  09/06/2021 141 135 - 146 mmol/L Final  01/07/2015 140 136 - 144 mmol/L Final    Comment:    **Effective January 18, 2015 the reference interval**   for Sodium, Serum will be changing to:                                             134 - 144          Failed - eGFR is 30 or above and within 180 days    GFR, Est African American  Date Value Ref Range Status  06/06/2019 122 > OR = 60 mL/min/1.67m2 Final   GFR, Est Non African American  Date Value Ref Range Status  06/06/2019 105 > OR = 60 mL/min/1.74m2 Final   GFR, Estimated  Date Value Ref Range Status  03/08/2021 >60 >60 mL/min Final    Comment:    (NOTE) Calculated using the CKD-EPI Creatinine Equation (2021)          Failed - Last BP in normal range    BP Readings from Last 1 Encounters:  11/01/21 (!) 154/88         Passed - Patient is not pregnant      Passed - Valid encounter within last 6 months    Recent Outpatient Visits           5 months ago Acute cystitis with hematuria   [place], DO   7 months ago Annual physical exam   [facility] Olin Hauser, DO   2 years ago Blepharitis of right upper eyelid, unspecified type   [place], Devonne Doughty, DO   2 years ago Prediabetes   [facility] [place], Devonne Doughty, DO   2 years ago SBO (small bowel obstruction) [facility])   [facility] [place], Devonne Doughty, DO               metFORMIN (GLUCOPHAGE) 500 MG tablet [Pharmacy Med Name: METFORMIN HCL 500 MG TABLET] 90 tablet 0    Sig: TAKE 1 TABLET BY MOUTH EVERY DAY WITH BREAKFAST  Endocrinology:  Diabetes - Biguanides Failed - 04/23/2022  8:39 AM      Failed - HBA1C is between 0 and 7.9 and within 180 days    Hgb A1c MFr Bld  Date Value Ref Range Status  09/06/2021 6.2 (H) <5.7 % of total Hgb Final    Comment:    For someone without known diabetes, a hemoglobin  A1c value between 5.7% and 6.4% is consistent with prediabetes and should be confirmed with a  follow-up test. . For someone with known diabetes, a value <7% indicates that their diabetes is well controlled. A1c targets should be individualized based on duration of diabetes, age, comorbid conditions, and other considerations. . This assay result is consistent with an increased risk of diabetes. . Currently, no consensus exists regarding use of hemoglobin A1c for diagnosis of diabetes for children. .          Failed - eGFR in normal range and within 360 days    GFR, Est African American  Date Value Ref Range Status  06/06/2019 122 > OR = 60 mL/min/1.43m2 Final   GFR, Est Non African American  Date Value Ref Range Status  06/06/2019 105 > OR = 60 mL/min/1.78m2 Final   GFR, Estimated  Date Value Ref Range Status  03/08/2021 >60 >60 mL/min Final    Comment:    (NOTE) Calculated using the CKD-EPI Creatinine  Equation (2021)          Failed - B12 Level in normal range and within 720 days    No results found for: "VITAMINB12"       Passed - Cr in normal range and within 360 days    Creat  Date Value Ref Range Status  09/06/2021 0.78 0.70 - 1.35 mg/dL Final         Passed - Valid encounter within last 6 months    Recent Outpatient Visits           5 months ago Acute cystitis with hematuria   [place], DO   7 months ago Annual physical exam   [facility] Olin Hauser, DO   2 years ago Blepharitis of right upper eyelid, unspecified type   [place], Devonne Doughty, DO   2 years ago Prediabetes   [facility] [place], Devonne Doughty, DO   2 years ago SBO (small bowel obstruction) [facility])   [facility] [place], Devonne Doughty, DO              Passed - CBC within normal limits and completed in the last 12 months    WBC  Date Value Ref Range Status  09/06/2021 6.0 3.8 - 10.8 Thousand/uL Final   RBC  Date Value Ref Range Status  09/06/2021 4.95 4.20 - 5.80 Million/uL Final   Hemoglobin  Date Value Ref Range Status  09/06/2021 16.0 13.2 - 17.1 g/dL Final  01/07/2015 16.7 12.6 - 17.7 g/dL Final   HCT  Date Value Ref Range Status  09/06/2021 47.8 38.5 - 50.0 % Final   Hematocrit  Date Value Ref Range Status  01/07/2015 47.4 37.5 - 51.0 % Final   MCHC  Date Value Ref Range Status  09/06/2021 33.5 32.0 - 36.0 g/dL Final   [facility]  Date Value Ref Range Status  09/06/2021 32.3 27.0 - 33.0 pg Final   MCV  Date Value Ref Range  Status  09/06/2021 96.6 80.0 - 100.0 fL Final  01/07/2015 93 79 - 97 fL Final   No results found for: "PLTCOUNTKUC", "LABPLAT", "POCPLA" RDW  Date Value Ref Range Status  09/06/2021 12.1 11.0 - 15.0 % Final  01/07/2015 12.9 12.3 - 15.4 % Final         Signed  Prescriptions Disp Refills   atorvastatin (LIPITOR) 20 MG tablet 90 tablet 1    Sig: TAKE 1 TABLET BY MOUTH EVERY DAY     Cardiovascular:  Antilipid - Statins Failed - 04/23/2022  8:39 AM      Failed - Lipid Panel in normal range within the last 12 months    Cholesterol, Total  Date Value Ref Range Status  01/07/2015 216 (H) 100 - 199 mg/dL Final   Cholesterol  Date Value Ref Range Status  09/06/2021 179 <200 mg/dL Final   LDL Cholesterol (Calc)  Date Value Ref Range Status  09/06/2021 92 mg/dL (calc) Final    Comment:    Reference range: <100 . Desirable range <100 mg/dL for primary prevention;   <70 mg/dL for patients with CHD or diabetic patients  with > or = 2 CHD risk factors. Marland Kitchen LDL-C is now calculated using the Martin-Hopkins  calculation, which is a validated novel method providing  better accuracy than the Friedewald equation in the  estimation of LDL-C.  Cresenciano Genre et al. Annamaria Helling. [id number]): 2061-2068  (http://education.[facility].com/faq/FAQ164)    HDL  Date Value Ref Range Status  09/06/2021 62 > OR = 40 mg/dL Final  01/07/2015 58 >39 mg/dL Final   Triglycerides  Date Value Ref Range Status  09/06/2021 152 (H) <150 mg/dL Final         Passed - Patient is not pregnant      Passed - Valid encounter within last 12 months    Recent Outpatient Visits           5 months ago Acute cystitis with hematuria   [place], DO   7 months ago Annual physical exam   [facility] Olin Hauser, DO   2 years ago Blepharitis of right upper eyelid, unspecified type   [place], [place], DO   2 years ago Prediabetes   [facility] Olin Hauser, DO   2 years ago SBO (small bowel obstruction) [facility])   [place], [place]

## 2022-05-10 ENCOUNTER — Encounter: Payer: Self-pay | Admitting: Family Medicine

## 2022-05-10 MED ORDER — ALBUTEROL SULFATE HFA 108 (90 BASE) MCG/ACT IN AERS: 2.0000 | INHALATION_SPRAY | Freq: Four times a day (QID) | RESPIRATORY_TRACT | 2 refills | Status: DC | PRN

## 2022-06-13 ENCOUNTER — Ambulatory Visit
Admission: RE | Admit: 2022-06-13 | Discharge: 2022-06-13 | Disposition: A | Discharge status: Home / Self Care | Payer: 59 | Source: Home / Self Care | Attending: Family Medicine | Admitting: Family Medicine

## 2022-06-13 ENCOUNTER — Ambulatory Visit (INDEPENDENT_AMBULATORY_CARE_PROVIDER_SITE_OTHER): Payer: 59 | Admitting: Family Medicine

## 2022-06-13 ENCOUNTER — Ambulatory Visit: Payer: Self-pay

## 2022-06-13 ENCOUNTER — Encounter: Payer: Self-pay | Admitting: Family Medicine

## 2022-06-13 ENCOUNTER — Ambulatory Visit
Admission: RE | Admit: 2022-06-13 | Discharge: 2022-06-13 | Disposition: A | Discharge status: Home / Self Care | Payer: 59 | Source: Ambulatory Visit | Attending: Family Medicine | Admitting: Family Medicine

## 2022-06-13 VITALS — BP 138/76 | HR 83 | Ht 72.0 in | Wt 224.0 lb

## 2022-06-13 DIAGNOSIS — R1011 Right upper quadrant pain: Secondary | ICD-10-CM | POA: Diagnosis not present

## 2022-06-13 DIAGNOSIS — R1013 Epigastric pain: Secondary | ICD-10-CM

## 2022-06-13 DIAGNOSIS — R109 Unspecified abdominal pain: Secondary | ICD-10-CM | POA: Diagnosis not present

## 2022-06-13 MED ORDER — AMOXICILLIN-POT CLAVULANATE 875-125 MG PO TABS: 1.0000 | ORAL_TABLET | Freq: Two times a day (BID) | ORAL | 0 refills | Status: DC

## 2022-06-13 MED ORDER — DICYCLOMINE HCL 10 MG PO CAPS: 10.0000 mg | ORAL_CAPSULE | Freq: Three times a day (TID) | ORAL | 0 refills | Status: DC

## 2022-06-13 NOTE — Progress Notes (Signed)
Subjective:    Patient ID: Jeremy [name], male    DOB: 1961/01/10, 62 y.o.   MRN: [id number]  Jeremy [name] is a 62 y.o. male presenting on 06/13/2022 for Abdominal Pain (Started 3 days ago after eating)  Patient presents for a same day appointment.  HPI  Abdominal Pain / Cramping / Possible Diverticulitis Flare  Reports symptoms started Sunday morning, he admits ate tacos later that evening for dinner for cinco de mayo, developed worsening symptoms, had episodic abdominal cramping epigastric to R side, would have severe cramping pain up to 9 out of 10. He had loose bowel movement overnight but no blood or dark stool.  He has only had one meal with an egg since onset. It did not cause pain. He is unsure if meals are provoking.  He has had a lot of water intake for hydration.  He admits can have some darker or orange urine.  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  Denies fever chills nausea vomiting dark stool blood in stool     06/13/2022    2:42 PM 11/01/2021    8:19 AM 09/13/2021    5:27 PM  Depression screen PHQ 2/9  Decreased Interest 0 0 0  Down, Depressed, Hopeless 0 0 0  PHQ - 2 Score 0 0 0  Altered sleeping  0   Tired, decreased energy  0   Change in appetite  0   Feeling bad or failure about yourself   0   Trouble concentrating  0   Moving slowly or fidgety/restless  0   Suicidal thoughts  0   PHQ-9 Score  0   Difficult doing work/chores  Not difficult at all     Social History   Tobacco Use   Smoking status: Former    Packs/day: 1.00    Years: 35.00    Additional pack years: 0.00    Total pack years: 35.00    Types: Cigarettes    Quit date: 01/06/2014    Years since quitting: 8.4   Smokeless tobacco: Former  Building services engineer Use: Never used  Substance Use Topics   Alcohol use: Yes    Alcohol/week: 6.0  standard drinks of alcohol    Types: 6 Cans of beer per week   Drug use: No    Review of Systems Per HPI unless specifically indicated above     Objective:    BP 138/76   Pulse 83   Ht 6' (1.829 m)   Wt 224 lb (101.6 kg)   SpO2 98%   BMI 30.38 kg/m   Wt Readings from Last 3 Encounters:  06/13/22 224 lb (101.6 kg)  11/01/21 224 lb (101.6 kg)  09/13/21 222 lb (100.7 kg)    Physical Exam Vitals and nursing note reviewed.  Constitutional:      General: He is not in acute distress.    Appearance: He is well-developed. He is not diaphoretic.     Comments: Well-appearing, comfortable, cooperative  HENT:     Head: Normocephalic and atraumatic.  Eyes:     General:        Right eye: No discharge.        Left eye: No discharge.     Conjunctiva/sclera: Conjunctivae normal.  Neck:     Thyroid: No thyromegaly.  Cardiovascular:     Rate  and Rhythm: Normal rate and regular rhythm.     Pulses: Normal pulses.     Heart sounds: Normal heart sounds. No murmur heard. Pulmonary:     Effort: Pulmonary effort is normal. No respiratory distress.     Breath sounds: Normal breath sounds. No wheezing or rales.  Abdominal:     Tenderness: There is abdominal tenderness in the right upper quadrant and epigastric area. There is no right CVA tenderness, left CVA tenderness, guarding or rebound. Negative signs include Murphy's sign, Rovsing's sign and McBurney's sign.  Musculoskeletal:        General: Normal range of motion.     Cervical back: Normal range of motion and neck supple.  Lymphadenopathy:     Cervical: No cervical adenopathy.  Skin:    General: Skin is warm and dry.     Findings: No erythema or rash.  Neurological:     Mental Status: He is alert and oriented to person, place, and time. Mental status is at baseline.  Psychiatric:        Behavior: Behavior normal.     Comments: Well groomed, good eye contact, normal speech and thoughts    I have personally reviewed the radiology  report from 06/13/22 on [facility] X-ray.  Narrative & Impression  CLINICAL DATA:  Abdominal pain and cramping for 3 days. History of bowel obstruction on diverticulitis.   EXAM: ABDOMEN - 1 VIEW   COMPARISON:  CT 03/08/2021   FINDINGS: Divided supine views of the abdomen obtained. No bowel distension or evidence of obstruction. Small volume of stool in the colon. No obvious free air on these supine views. No radiopaque calculi. Rounded calcified density in the left pelvis is a peripherally calcified lymph node on prior CT. No acute osseous findings.   IMPRESSION: Nonobstructive bowel gas pattern.  No acute findings by radiograph.     Electronically Signed   By: Narda Rutherford M.D.   On: 06/13/2022 15:41   -----   I have personally reviewed the radiology report from 03/08/21 on CT ABDOMEN PELVIS.   Study Result CLINICAL DATA: Abdominal pain, acute, nonlocalized. lower abd pain with black loose stools for the past 2 weeks. History of bowel obstruction  EXAM: CT ABDOMEN AND PELVIS WITH CONTRAST  TECHNIQUE: Multidetector CT imaging of the abdomen and pelvis was performed using the standard protocol following bolus administration of intravenous contrast.  RADIATION DOSE REDUCTION: This exam was performed according to the departmental dose-optimization program which includes automated exposure control, adjustment of the mA and/or kV according to patient size and/or use of iterative reconstruction technique.  CONTRAST: OMNIPAQUE IOHEXOL 300 MG/ML SOLN  COMPARISON: None.  FINDINGS: Lower chest: No acute abnormality. Coronary artery calcifications.  Hepatobiliary: The hepatic parenchyma is diffusely hypodense compared to the splenic parenchyma consistent with fatty infiltration. No focal liver abnormality. No gallstones, gallbladder wall thickening, or pericholecystic fluid. No biliary dilatation.  Pancreas: No focal lesion. Normal pancreatic contour. No  surrounding inflammatory changes. No main pancreatic ductal dilatation.  Spleen: Normal in size without focal abnormality.  Adrenals/Urinary Tract:  No adrenal nodule bilaterally.  Bilateral kidneys enhance symmetrically.  No hydronephrosis. No hydroureter.  The urinary bladder is unremarkable.  On delayed imaging, there is no urothelial wall thickening and there are no filling defects in the opacified portions of the bilateral collecting systems or ureters.  Stomach/Bowel: Stomach is within normal limits. No evidence of bowel wall thickening or dilatation. Colonic diverticulosis. The appendix is not definitely identified with no inflammatory  changes in the right lower quadrant to suggest acute appendicitis.  Vascular/Lymphatic: No abdominal aorta or iliac aneurysm. Moderate atherosclerotic plaque of the aorta and its branches. No abdominal, pelvic, or inguinal lymphadenopathy.  Reproductive: Prostate is prominent.  Other: No intraperitoneal free fluid. No intraperitoneal free gas. No organized fluid collection.  Musculoskeletal:  No abdominal wall hernia or abnormality.  No suspicious lytic or blastic osseous lesions. No acute displaced fracture. Multilevel degenerative changes of the spine.  IMPRESSION: 1. Hepatic steatosis. 2. Colonic diverticulosis with no acute diverticulitis. 3. Aortic Atherosclerosis (ICD10-I70.0).   Electronically Signed By: Tish Frederickson M.D. On: 03/08/2021 15:08  Results for orders placed or performed in visit on 11/01/21  Urine Culture   Specimen: Urine  Result Value Ref Range   MICRO NUMBER: [id number]    SPECIMEN QUALITY: Adequate    Sample Source URINE    STATUS: FINAL    Result: No Growth   POCT urinalysis dipstick  Result Value Ref Range   Color, UA Yellow    Clarity, UA Clear    Glucose, UA Negative Negative   Bilirubin, UA Negative    Ketones, UA Negative    Spec Grav, UA 1.015 1.010 - 1.025   Blood, UA Negative     pH, UA 6.0 5.0 - 8.0   Protein, UA Negative Negative   Urobilinogen, UA 0.2 0.2 or 1.0 E.U./dL   Nitrite, UA Negative    Leukocytes, UA Negative Negative   Appearance     Odor        Assessment & Plan:   Problem List Items Addressed This Visit   None Visit Diagnoses     Epigastric abdominal pain    -  Primary   Relevant Medications   dicyclomine (BENTYL) 10 MG capsule   amoxicillin-clavulanate (AUGMENTIN) 875-125 MG tablet   Other Relevant Orders   COMPLETE METABOLIC PANEL WITH GFR   CBC with Differential/Platelet   Lipase   DG Abd 1 View   Abdominal cramping       Relevant Medications   dicyclomine (BENTYL) 10 MG capsule   Other Relevant Orders   DG Abd 1 View   Colicky RUQ abdominal pain           Suspected may be diverticulitis or colitis  Benign abdomen on exam today. Past history with diverticulitis vs colitis, and history of bowel obstruction, we will pursue further testing and eval. But he has had BM past 2 days. Not indicative of obstruction.  Exam and history not supportive of gallbladder disease.  No alcohol but will still check Lipase for pancreatitis   He has fatty liver history, but seems uncharacteristic presentation of hepatic disease.  Trial on therapy for bowels / colitis  Take Dicyclomine Bentyl as needed 3-4 times per day with meals or just whenever for cramping or abdominal pain.  [facility] - OTC Peppermint Oil (Triple Coated Capsule) 180mg  take one 3 times daily to reduce diarrhea  Add on Antibiotic IF NOT IMPROVED can take Augmentin course for colitis.   X-ray [facility] STAT today, reviewed, negative, see above  Pending results on labs CMET CBC Lipase  If severe worsening change, fever nausea vomiting cannot keep food down worsening pain, seek care sooner at hospital ED.  Orders Placed This Encounter  Procedures   DG Abd 1 View    Standing Status:   Future    Standing Expiration Date:   09/13/2022    Order Specific Question:   Reason for Exam  (SYMPTOM  OR DIAGNOSIS  REQUIRED)    Answer:   abdominal pain cramping 3 days, history of diverticulitis, history bowel obstruction    Order Specific Question:   Preferred imaging location?    Answer:   [facility]   COMPLETE METABOLIC PANEL WITH GFR   CBC with Differential/Platelet   Lipase     Meds ordered this encounter  Medications   dicyclomine (BENTYL) 10 MG capsule    Sig: Take 1 capsule (10 mg total) by mouth 4 (four) times daily -  before meals and at bedtime.    Dispense:  30 capsule    Refill:  0   amoxicillin-clavulanate (AUGMENTIN) 875-125 MG tablet    Sig: Take 1 tablet by mouth 2 (two) times daily.    Dispense:  20 tablet    Refill:  0    Follow up plan: Return if symptoms worsen or fail to improve.   Saralyn Pilar, DO [facility] 06/13/2022, 2:48 PM

## 2022-06-13 NOTE — Patient Instructions (Addendum)
Thank you for coming to the office today.  Suspected may be diverticulitis or colitis  Take Dicyclomine Bentyl as needed 3-4 times per day with meals or just whenever for cramping or abdominal pain.  IBGard - OTC Peppermint Oil (Triple Coated Capsule) 180mg  take one 3 times daily to reduce diarrhea  Add on Antibiotic IF NOT IMPROVED can take Augmentin course for colitis.  Today X-ray and Labs. Syay tuned for results.  Unlikely to be liver or gallbladder.  If severe worsening change, fever nausea vomiting cannot keep food down worsening pain, seek care sooner at hospital ED.  Please schedule a Follow-up Appointment to: Return if symptoms worsen or fail to improve.  If you have any other questions or concerns, please feel free to call the office or send a message through MyChart. You may also schedule an earlier appointment if necessary.  Additionally, you may be receiving a survey about your experience at our office within a few days to 1 week by e-mail or mail. We value your feedback.  Saralyn Pilar, DO [facility], [place]

## 2022-06-13 NOTE — Telephone Encounter (Signed)
  Chief Complaint: abdominal pain Symptoms: abdominal pain, constant, 6/10, was intermittent cramping but gotten worse Frequency: Sunday Pertinent Negatives: Patient denies N/V/D Disposition: [] ED /[] Urgent Care (no appt availability in office) / [x] Appointment(In office/virtual)/ []  [facility]/ [] Home Care/ [] Refused Recommended Disposition /[] [place] [facility]/ []  Follow-up with PCP Additional Notes: pt states pain started Sunday, took tylenol yesterday but didn't give any relief. Nothing makes pain better or worse. Scheduled OV today at 1420 with PCP.   Reason for Disposition  [1] MILD-MODERATE pain AND [2] constant AND [3] present > 2 hours  Answer Assessment - Initial Assessment Questions 1. LOCATION: "Where does it hurt?"      Abdominal pain center is worse  3. ONSET: "When did the pain begin?" (Minutes, hours or days ago)      Sunday  5. PATTERN "Does the pain come and go, or is it constant?"    - If it comes and goes: "How long does it last?" "Do you have pain now?"     (Note: Comes and goes means the pain is intermittent. It goes away completely between bouts.)    - If constant: "Is it getting better, staying the same, or getting worse?"      (Note: Constant means the pain never goes away completely; most serious pain is constant and gets worse.)      Constant but was intermittent cramping  6. SEVERITY: "How bad is the pain?"  (e.g., Scale 1-10; mild, moderate, or severe)    - MILD (1-3): Doesn't interfere with normal activities, abdomen soft and not tender to touch.     - MODERATE (4-7): Interferes with normal activities or awakens from sleep, abdomen tender to touch.     - SEVERE (8-10): Excruciating pain, doubled over, unable to do any normal activities.       6/10 9. RELIEVING/AGGRAVATING FACTORS: "What makes it better or worse?" (e.g., antacids, bending or twisting motion, bowel movement)     no 10. OTHER SYMPTOMS: "Do you have any other symptoms?" (e.g.,  back pain, diarrhea, fever, urination pain, vomiting)       no  Protocols used: Abdominal Pain - Male-A-AH

## 2022-06-14 LAB — COMPLETE METABOLIC PANEL WITH GFR
AG Ratio: 1.6 (calc) (ref 1.0–2.5)
ALT: 37 U/L (ref 9–46)
AST: 39 U/L — ABNORMAL HIGH (ref 10–35)
Albumin: 4.3 g/dL (ref 3.6–5.1)
Alkaline phosphatase (APISO): 102 U/L (ref 35–144)
BUN: 10 mg/dL (ref 7–25)
CO2: 24 mmol/L (ref 20–32)
Calcium: 10 mg/dL (ref 8.6–10.3)
Chloride: 103 mmol/L (ref 98–110)
Creat: 0.75 mg/dL (ref 0.70–1.35)
Globulin: 2.7 g/dL (calc) (ref 1.9–3.7)
Glucose, Bld: 100 mg/dL (ref 65–139)
Potassium: 4.6 mmol/L (ref 3.5–5.3)
Sodium: 140 mmol/L (ref 135–146)
Total Bilirubin: 0.7 mg/dL (ref 0.2–1.2)
Total Protein: 7 g/dL (ref 6.1–8.1)
eGFR: 102 mL/min/{1.73_m2} (ref >=60)

## 2022-06-14 LAB — CBC WITH DIFFERENTIAL/PLATELET
Absolute Monocytes: 445 cells/uL (ref 200–950)
Basophils Absolute: 79 cells/uL (ref 0–200)
Basophils Relative: 1.3 %
Eosinophils Absolute: 153 cells/uL (ref 15–500)
Eosinophils Relative: 2.5 %
HCT: 46.1 % (ref 38.5–50.0)
Hemoglobin: 15.6 g/dL (ref 13.2–17.1)
Lymphs Abs: 2050 cells/uL (ref 850–3900)
MCH: 31.4 pg (ref 27.0–33.0)
MCHC: 33.8 g/dL (ref 32.0–36.0)
MCV: 92.8 fL (ref 80.0–100.0)
MPV: 11.5 fL (ref 7.5–12.5)
Monocytes Relative: 7.3 %
Neutro Abs: 3373 cells/uL (ref 1500–7800)
Neutrophils Relative %: 55.3 %
Platelets: 206 10*3/uL (ref 140–400)
RBC: 4.97 10*6/uL (ref 4.20–5.80)
RDW: 12.4 % (ref 11.0–15.0)
Total Lymphocyte: 33.6 %
WBC: 6.1 10*3/uL (ref 3.8–10.8)

## 2022-06-14 LAB — LIPASE: Lipase: 29 U/L (ref 7–60)

## 2022-06-18 ENCOUNTER — Encounter: Payer: Self-pay | Admitting: Family Medicine

## 2022-06-18 DIAGNOSIS — M722 Plantar fascial fibromatosis: Secondary | ICD-10-CM

## 2022-07-19 ENCOUNTER — Encounter: Payer: Self-pay | Admitting: Family Medicine

## 2022-07-21 ENCOUNTER — Other Ambulatory Visit: Payer: Self-pay | Admitting: Family Medicine

## 2022-07-21 DIAGNOSIS — K219 Gastro-esophageal reflux disease without esophagitis: Secondary | ICD-10-CM

## 2022-07-21 NOTE — Telephone Encounter (Signed)
Requested Prescriptions  Pending Prescriptions Disp Refills   pantoprazole (PROTONIX) 40 MG tablet [Pharmacy Med Name: PANTOPRAZOLE SOD DR 40 MG TAB] 90 tablet 0    Sig: TAKE 1 TABLET BY MOUTH EVERY DAY BEFORE BREAKFAST     Gastroenterology: Proton Pump Inhibitors Passed - 07/21/2022  2:18 AM      Passed - Valid encounter within last 12 months    Recent Outpatient Visits           1 month ago Epigastric abdominal pain   [place] [facility] [place], Netta Neat, DO   8 months ago Acute cystitis with hematuria   [place] [facility] Smitty Cords, DO   10 months ago Annual physical exam   [place] [facility] Smitty Cords, DO   2 years ago Blepharitis of right upper eyelid, unspecified type   [place] [facility] [place], Netta Neat, DO   2 years ago Prediabetes    [facility] [place], Netta Neat, [place]

## 2022-08-13 ENCOUNTER — Other Ambulatory Visit: Payer: Self-pay | Admitting: Family Medicine

## 2022-08-14 NOTE — Telephone Encounter (Signed)
Requested Prescriptions  Pending Prescriptions Disp Refills   albuterol (VENTOLIN HFA) 108 (90 Base) MCG/ACT inhaler [Pharmacy Med Name: ALBUTEROL HFA (PROVENTIL) INH] 6.7 each 0    Sig: TAKE 2 PUFFS BY MOUTH EVERY 6 HOURS AS NEEDED FOR WHEEZE OR SHORTNESS OF BREATH     Pulmonology:  Beta Agonists 2 Passed - 08/13/2022  8:37 AM      Passed - Last BP in normal range    BP Readings from Last 1 Encounters:  06/13/22 138/76         Passed - Last Heart Rate in normal range    Pulse Readings from Last 1 Encounters:  06/13/22 83         Passed - Valid encounter within last 12 months    Recent Outpatient Visits           2 months ago Epigastric abdominal pain   [facility] Smitty Cords, DO   9 months ago Acute cystitis with hematuria   [place] [facility] Smitty Cords, DO   11 months ago Annual physical exam   [place] [facility] [place], Netta Neat, DO   2 years ago Blepharitis of right upper eyelid, unspecified type   [place] [facility] [place], Netta Neat, DO   2 years ago Prediabetes   [place] [facility] [place], Netta Neat, [place]

## 2022-08-28 ENCOUNTER — Encounter: Payer: Self-pay | Admitting: Family Medicine

## 2022-08-28 DIAGNOSIS — E785 Hyperlipidemia, unspecified: Secondary | ICD-10-CM

## 2022-08-28 DIAGNOSIS — I10 Essential (primary) hypertension: Secondary | ICD-10-CM

## 2022-08-28 DIAGNOSIS — R7303 Prediabetes: Secondary | ICD-10-CM

## 2022-08-29 ENCOUNTER — Other Ambulatory Visit: Payer: Self-pay

## 2022-08-29 ENCOUNTER — Ambulatory Visit
Admission: RE | Admit: 2022-08-29 | Discharge: 2022-08-29 | Disposition: A | Discharge status: Home / Self Care | Payer: 59 | Source: Ambulatory Visit | Attending: Acute Care | Admitting: Acute Care

## 2022-08-29 DIAGNOSIS — K219 Gastro-esophageal reflux disease without esophagitis: Secondary | ICD-10-CM

## 2022-08-29 DIAGNOSIS — Z87891 Personal history of nicotine dependence: Secondary | ICD-10-CM | POA: Insufficient documentation

## 2022-08-29 DIAGNOSIS — Z122 Encounter for screening for malignant neoplasm of respiratory organs: Secondary | ICD-10-CM | POA: Diagnosis present

## 2022-08-29 MED ORDER — AMLODIPINE BESY-BENAZEPRIL HCL 5-10 MG PO CAPS
ORAL_CAPSULE | ORAL | 0 refills | Status: DC
Start: 2022-08-29 — End: 2022-11-28

## 2022-08-29 MED ORDER — PANTOPRAZOLE SODIUM 40 MG PO TBEC
40.0000 mg | DELAYED_RELEASE_TABLET | Freq: Every day | ORAL | 1 refills | Status: DC
Start: 2022-08-29 — End: 2023-01-24

## 2022-08-29 MED ORDER — METFORMIN HCL 500 MG PO TABS
ORAL_TABLET | ORAL | 1 refills | Status: DC
Start: 2022-08-29 — End: 2023-01-24

## 2022-08-29 MED ORDER — ATORVASTATIN CALCIUM 20 MG PO TABS
20.0000 mg | ORAL_TABLET | Freq: Every day | ORAL | 1 refills | Status: DC
Start: 2022-08-29 — End: 2023-01-24

## 2022-09-05 ENCOUNTER — Other Ambulatory Visit: Payer: Self-pay | Admitting: Acute Care

## 2022-09-05 DIAGNOSIS — Z122 Encounter for screening for malignant neoplasm of respiratory organs: Secondary | ICD-10-CM

## 2022-09-05 DIAGNOSIS — Z87891 Personal history of nicotine dependence: Secondary | ICD-10-CM

## 2022-09-06 ENCOUNTER — Encounter: Payer: Self-pay | Admitting: Family Medicine

## 2022-11-27 ENCOUNTER — Other Ambulatory Visit: Payer: Self-pay | Admitting: Family Medicine

## 2022-11-27 DIAGNOSIS — I10 Essential (primary) hypertension: Secondary | ICD-10-CM

## 2022-11-28 NOTE — Telephone Encounter (Signed)
Requested by interface surescripts.  Requested Prescriptions  Pending Prescriptions Disp Refills   amLODipine-benazepril (LOTREL) 5-10 MG capsule [Pharmacy Med Name: amLODIPine Besy-Benazepril HCl 5-10 MG Oral Capsule] 90 capsule 0    Sig: TAKE 1 CAPSULE BY MOUTH DAILY     Cardiovascular: CCB + ACEI Combos Passed - 11/27/2022 11:59 AM      Passed - Cr in normal range and within 180 days    Creat  Date Value Ref Range Status  06/13/2022 0.75 0.70 - 1.35 mg/dL Final         Passed - K in normal range and within 180 days    Potassium  Date Value Ref Range Status  06/13/2022 4.6 3.5 - 5.3 mmol/L Final         Passed - Na in normal range and within 180 days    Sodium  Date Value Ref Range Status  06/13/2022 140 135 - 146 mmol/L Final  01/07/2015 140 136 - 144 mmol/L Final    Comment:    **Effective January 18, 2015 the reference interval**   for Sodium, Serum will be changing to:                                             134 - 144          Passed - eGFR is 30 or above and within 180 days    GFR, Est African American  Date Value Ref Range Status  06/06/2019 122 > OR = 60 mL/min/1.66m2 Final   GFR, Est Non African American  Date Value Ref Range Status  06/06/2019 105 > OR = 60 mL/min/1.85m2 Final   GFR, Estimated  Date Value Ref Range Status  03/08/2021 >60 >60 mL/min Final    Comment:    (NOTE) Calculated using the CKD-EPI Creatinine Equation (2021)    eGFR  Date Value Ref Range Status  06/13/2022 102 > OR = 60 mL/min/1.24m2 Final         Passed - Patient is not pregnant      Passed - Last BP in normal range    BP Readings from Last 1 Encounters:  06/13/22 138/76         Passed - Valid encounter within last 6 months    Recent Outpatient Visits           5 months ago Epigastric abdominal pain   [place] [facility] Smitty Cords, DO   1 year ago Acute cystitis with hematuria   [place]  Smitty Cords, DO   1 year ago Annual physical exam   [place] [facility] Smitty Cords, DO   2 years ago Blepharitis of right upper eyelid, unspecified type   [facility] [place], Netta Neat, DO   2 years ago Prediabetes   [place] [facility] [place], Netta Neat, [place]

## 2023-01-24 ENCOUNTER — Other Ambulatory Visit: Payer: Self-pay | Admitting: Family Medicine

## 2023-01-24 DIAGNOSIS — R7303 Prediabetes: Secondary | ICD-10-CM

## 2023-01-24 DIAGNOSIS — I10 Essential (primary) hypertension: Secondary | ICD-10-CM

## 2023-01-24 DIAGNOSIS — K219 Gastro-esophageal reflux disease without esophagitis: Secondary | ICD-10-CM

## 2023-01-24 DIAGNOSIS — E785 Hyperlipidemia, unspecified: Secondary | ICD-10-CM

## 2023-01-24 NOTE — Telephone Encounter (Signed)
Requested Prescriptions  Pending Prescriptions Disp Refills   pantoprazole (PROTONIX) 40 MG tablet [Pharmacy Med Name: Pantoprazole Sodium 40 MG Oral Tablet Delayed Release] 90 tablet 0    Sig: TAKE 1 TABLET BY MOUTH DAILY  BEFORE BREAKFAST     Gastroenterology: Proton Pump Inhibitors Passed - 01/24/2023  1:01 PM      Passed - Valid encounter within last 12 months    Recent Outpatient Visits           7 months ago Epigastric abdominal pain   [facility] [place], Netta Neat, DO   1 year ago Acute cystitis with hematuria   [facility] Smitty Cords, DO   1 year ago Annual physical exam   [place] [facility] Smitty Cords, DO   2 years ago Blepharitis of right upper eyelid, unspecified type   [place] [facility] [place], Netta Neat, DO   3 years ago Prediabetes   [place] [facility], Netta Neat, DO               metFORMIN (GLUCOPHAGE) 500 MG tablet [Pharmacy Med Name: metFORMIN HCl 500 MG Oral Tablet] 90 tablet     Sig: TAKE 1 TABLET BY MOUTH DAILY  WITH BREAKFAST     Endocrinology:  Diabetes - Biguanides Failed - 01/24/2023  1:01 PM      Failed - HBA1C is between 0 and 7.9 and within 180 days    Hgb A1c MFr Bld  Date Value Ref Range Status  09/06/2021 6.2 (H) <5.7 % of total Hgb Final    Comment:    For someone without known diabetes, a hemoglobin  A1c value between 5.7% and 6.4% is consistent with prediabetes and should be confirmed with a  follow-up test. . For someone with known diabetes, a value <7% indicates that their diabetes is well controlled. A1c targets should be individualized based on duration of diabetes, age, comorbid conditions, and other considerations. . This assay result is consistent with an increased risk of diabetes. . Currently, no consensus exists regarding use of hemoglobin A1c for  diagnosis of diabetes for children. .          Failed - B12 Level in normal range and within 720 days    No results found for: "VITAMINB12"       Failed - Valid encounter within last 6 months    Recent Outpatient Visits           7 months ago Epigastric abdominal pain   [place] [facility] Smitty Cords, DO   1 year ago Acute cystitis with hematuria   [facility] Smit[facility] Cords, DO   1 year ago Annual physical exam   [place] [facility] Smitty Cords, DO   2 years ago Blepharitis of right upper eyelid, unspecified type   [place] [facility] Smitty Cords, DO   3 years ago Prediabetes   [place] [facility] Smitty Cords, DO              Passed - Cr in normal range and within 360 days    Creat  Date Value Ref Range Status  06/13/2022 0.75 0.70 - 1.35 mg/dL Final         Passed - eGFR in normal range and within 360 days  GFR, Est African American  Date Value Ref Range Status  06/06/2019 122 > OR = 60 mL/min/1.45m2 Final   GFR, Est Non African American  Date Value Ref Range Status  06/06/2019 105 > OR = 60 mL/min/1.97m2 Final   GFR, Estimated  Date Value Ref Range Status  03/08/2021 >60 >60 mL/min Final    Comment:    (NOTE) Calculated using the CKD-EPI Creatinine Equation (2021)    eGFR  Date Value Ref Range Status  06/13/2022 102 > OR = 60 mL/min/1.35m2 Final         Passed - CBC within normal limits and completed in the last 12 months    WBC  Date Value Ref Range Status  06/13/2022 6.1 3.8 - 10.8 Thousand/uL Final   RBC  Date Value Ref Range Status  06/13/2022 4.97 4.20 - 5.80 Million/uL Final   Hemoglobin  Date Value Ref Range Status  06/13/2022 15.6 13.2 - 17.1 g/dL Final  78/46/9629 52.8 12.6 - 17.7 g/dL Final   HCT  Date Value Ref Range Status  06/13/2022 46.1 38.5 - 50.0 % Final    Hematocrit  Date Value Ref Range Status  01/07/2015 47.4 37.5 - 51.0 % Final   MCHC  Date Value Ref Range Status  06/13/2022 33.8 32.0 - 36.0 g/dL Final   [facility]  Date Value Ref Range Status  06/13/2022 31.4 27.0 - 33.0 pg Final   MCV  Date Value Ref Range Status  06/13/2022 92.8 80.0 - 100.0 fL Final  01/07/2015 93 79 - 97 fL Final   No results found for: "PLTCOUNTKUC", "LABPLAT", "POCPLA" RDW  Date Value Ref Range Status  06/13/2022 12.4 11.0 - 15.0 % Final  01/07/2015 12.9 12.3 - 15.4 % Final          atorvastatin (LIPITOR) 20 MG tablet [Pharmacy Med Name: Atorvastatin Calcium 20 MG Oral Tablet] 90 tablet     Sig: TAKE 1 TABLET BY MOUTH DAILY     Cardiovascular:  Antilipid - Statins Failed - 01/24/2023  1:01 PM      Failed - Lipid Panel in normal range within the last 12 months    Cholesterol, Total  Date Value Ref Range Status  01/07/2015 216 (H) 100 - 199 mg/dL Final   Cholesterol  Date Value Ref Range Status  09/06/2021 179 <200 mg/dL Final   LDL Cholesterol (Calc)  Date Value Ref Range Status  09/06/2021 92 mg/dL (calc) Final    Comment:    Reference range: <100 . Desirable range <100 mg/dL for primary prevention;   <70 mg/dL for patients with CHD or diabetic patients  with > or = 2 CHD risk factors. Marland Kitchen LDL-C is now calculated using the Martin-Hopkins  calculation, which is a validated novel method providing  better accuracy than the Friedewald equation in the  estimation of LDL-C.  Horald Pollen et al. Lenox Ahr. 4132;440(10): 2061-2068  (http://education.[facility].com/faq/FAQ164)    HDL  Date Value Ref Range Status  09/06/2021 62 > OR = 40 mg/dL Final  27/25/3664 58 >40 mg/dL Final   Triglycerides  Date Value Ref Range Status  09/06/2021 152 (H) <150 mg/dL Final         Passed - Patient is not pregnant      Passed - Valid encounter within last 12 months    Recent Outpatient Visits           7 months ago Epigastric abdominal pain    [place] [place], Netta Neat, [place]  1 year ago Acute cystitis with hematuria   [place] [facility] [place], Netta Neat, DO   1 year ago Annual physical exam   [place] [facility] Smitty Cords, DO   2 years ago Blepharitis of right upper eyelid, unspecified type   [facility] [place], Netta Neat, DO   3 years ago Prediabetes   [place], Netta Neat, DO               amLODipine-benazepril (LOTREL) 5-10 MG capsule [place] Med Name: amLODIPine Besy-Benazepril HCl 5-10 MG Oral Capsule] 90 capsule     Sig: TAKE 1 CAPSULE BY MOUTH DAILY     Cardiovascular: CCB + ACEI Combos Failed - 01/24/2023  1:01 PM      Failed - Cr in normal range and within 180 days    Creat  Date Value Ref Range Status  06/13/2022 0.75 0.70 - 1.35 mg/dL Final         Failed - K in normal range and within 180 days    Potassium  Date Value Ref Range Status  06/13/2022 4.6 3.5 - 5.3 mmol/L Final         Failed - Na in normal range and within 180 days    Sodium  Date Value Ref Range Status  06/13/2022 140 135 - 146 mmol/L Final  01/07/2015 140 136 - 144 mmol/L Final    Comment:    **Effective January 18, 2015 the reference interval**   for Sodium, Serum will be changing to:                                             134 - 144          Failed - eGFR is 30 or above and within 180 days    GFR, Est African American  Date Value Ref Range Status  06/06/2019 122 > OR = 60 mL/min/1.68m2 Final   GFR, Est Non African American  Date Value Ref Range Status  06/06/2019 105 > OR = 60 mL/min/1.41m2 Final   GFR, Estimated  Date Value Ref Range Status  03/08/2021 >60 >60 mL/min Final    Comment:    (NOTE) Calculated using the CKD-EPI Creatinine Equation (2021)    eGFR  Date Value Ref Range Status  06/13/2022 102 > OR = 60 mL/min/1.29m2  Final         Failed - Valid encounter within last 6 months    Recent Outpatient Visits           7 months ago Epigastric abdominal pain   [place] [facility] Smitty Cords, DO   1 year ago Acute cystitis with hematuria   [facility] S[facility], DO   1 year ago Annual physical exam   [place] [facility] Smitty Cords, DO   2 years ago Blepharitis of right upper eyelid, unspecified type   [place] [facility] Smitty Cords, DO   3 years ago Prediabetes   [place] [facility] Smitty Cords, [place]              Passed - Patient is not pregnant      Passed - Last BP in normal range  BP Readings from Last 1 Encounters:  06/13/22 138/76

## 2023-01-24 NOTE — Telephone Encounter (Signed)
Requested medication (s) are due for refill today: yes  Requested medication (s) are on the active medication list: yes  Last refill:  metformin: 08/29/22 #90 1RF Atorvastatin: 08/29/22 #90 1 RF Amlodipine: 11/28/22 #90  Future visit scheduled: no  Notes to clinic:  overdue lab work   Requested Prescriptions  Pending Prescriptions Disp Refills   metFORMIN (GLUCOPHAGE) 500 MG tablet [Pharmacy Med Name: metFORMIN HCl 500 MG Oral Tablet] 90 tablet     Sig: TAKE 1 TABLET BY MOUTH DAILY  WITH BREAKFAST     Endocrinology:  Diabetes - Biguanides Failed - 01/24/2023  1:03 PM      Failed - HBA1C is between 0 and 7.9 and within 180 days    Hgb A1c MFr Bld  Date Value Ref Range Status  09/06/2021 6.2 (H) <5.7 % of total Hgb Final    Comment:    For someone without known diabetes, a hemoglobin  A1c value between 5.7% and 6.4% is consistent with prediabetes and should be confirmed with a  follow-up test. . For someone with known diabetes, a value <7% indicates that their diabetes is well controlled. A1c targets should be individualized based on duration of diabetes, age, comorbid conditions, and other considerations. . This assay result is consistent with an increased risk of diabetes. . Currently, no consensus exists regarding use of hemoglobin A1c for diagnosis of diabetes for children. .          Failed - B12 Level in normal range and within 720 days    No results found for: "VITAMINB12"       Failed - Valid encounter within last 6 months    Recent Outpatient Visits           7 months ago Epigastric abdominal pain   [facility] [place], Netta Neat, DO   1 year ago Acute cystitis with hematuria   [place] [facility] Smitty Cords, DO   1 year ago Annual physical exam   [place] Smitty Cords, DO   2 years ago Blepharitis of right upper eyelid, unspecified type   [facility] S[facility]ords, DO   3 years ago Prediabetes    [facility], DO              Passed - Cr in normal range and within 360 days    Creat  Date Value Ref Range Status  06/13/2022 0.75 0.70 - 1.35 mg/dL Final         Passed - eGFR in normal range and within 360 days    GFR, Est African American  Date Value Ref Range Status  06/06/2019 122 > OR = 60 mL/min/1.23m2 Final   GFR, Est Non African American  Date Value Ref Range Status  06/06/2019 105 > OR = 60 mL/min/1.43m2 Final   GFR, Estimated  Date Value Ref Range Status  03/08/2021 >60 >60 mL/min Final    Comment:    (NOTE) Calculated using the CKD-EPI Creatinine Equation (2021)    eGFR  Date Value Ref Range Status  06/13/2022 102 > OR = 60 mL/min/1.59m2 Final         Passed - CBC within normal limits and completed in the last 12 months    WBC  Date Value Ref Range Status  06/13/2022 6.1 3.8 - 10.8 Thousand/uL Final   RBC  Date Value Ref Range  Status  06/13/2022 4.97 4.20 - 5.80 Million/uL Final   Hemoglobin  Date Value Ref Range Status  06/13/2022 15.6 13.2 - 17.1 g/dL Final  29/52/8413 24.4 12.6 - 17.7 g/dL Final   HCT  Date Value Ref Range Status  06/13/2022 46.1 38.5 - 50.0 % Final   Hematocrit  Date Value Ref Range Status  01/07/2015 47.4 37.5 - 51.0 % Final   MCHC  Date Value Ref Range Status  06/13/2022 33.8 32.0 - 36.0 g/dL Final   [facility]  Date Value Ref Range Status  06/13/2022 31.4 27.0 - 33.0 pg Final   MCV  Date Value Ref Range Status  06/13/2022 92.8 80.0 - 100.0 fL Final  01/07/2015 93 79 - 97 fL Final   No results found for: "PLTCOUNTKUC", "LABPLAT", "POCPLA" RDW  Date Value Ref Range Status  06/13/2022 12.4 11.0 - 15.0 % Final  01/07/2015 12.9 12.3 - 15.4 % Final          atorvastatin (LIPITOR) 20 MG tablet [Pharmacy Med Name: Atorvastatin Calcium 20 MG Oral Tablet] 90 tablet     Sig:  TAKE 1 TABLET BY MOUTH DAILY     Cardiovascular:  Antilipid - Statins Failed - 01/24/2023  1:03 PM      Failed - Lipid Panel in normal range within the last 12 months    Cholesterol, Total  Date Value Ref Range Status  01/07/2015 216 (H) 100 - 199 mg/dL Final   Cholesterol  Date Value Ref Range Status  09/06/2021 179 <200 mg/dL Final   LDL Cholesterol (Calc)  Date Value Ref Range Status  09/06/2021 92 mg/dL (calc) Final    Comment:    Reference range: <100 . Desirable range <100 mg/dL for primary prevention;   <70 mg/dL for patients with CHD or diabetic patients  with > or = 2 CHD risk factors. Marland Kitchen LDL-C is now calculated using the Martin-Hopkins  calculation, which is a validated novel method providing  better accuracy than the Friedewald equation in the  estimation of LDL-C.  Horald Pollen et al. Lenox Ahr. 0102;725(36): 2061-2068  (http://education.[facility].com/faq/FAQ164)    HDL  Date Value Ref Range Status  09/06/2021 62 > OR = 40 mg/dL Final  64/40/3474 58 >25 mg/dL Final   Triglycerides  Date Value Ref Range Status  09/06/2021 152 (H) <150 mg/dL Final         Passed - Patient is not pregnant      Passed - Valid encounter within last 12 months    Recent Outpatient Visits           7 months ago Epigastric abdominal pain   [place] [facility] Smitty Cords, DO   1 year ago Acute cystitis with hematuria   [place] [facility] Smitty Cords, DO   1 year ago Annual physical exam   [place] [facility] Smit[facility] Cords, DO   2 years ago Blepharitis of right upper eyelid, unspecified type   [place] Smitty Cords, DO   3 years ago Prediabetes   [place] [facility] [place], Netta Neat, DO               amLODipine-benazepril (LOTREL) 5-10 MG capsule [Pharmacy Med Name: amLODIPine Besy-Benazepril HCl 5-10  MG Oral Capsule] 90 capsule     Sig: TAKE 1 CAPSULE BY MOUTH DAILY     Cardiovascular: CCB + ACEI Combos Failed - 01/24/2023  1:03  PM      Failed - Cr in normal range and within 180 days    Creat  Date Value Ref Range Status  06/13/2022 0.75 0.70 - 1.35 mg/dL Final         Failed - K in normal range and within 180 days    Potassium  Date Value Ref Range Status  06/13/2022 4.6 3.5 - 5.3 mmol/L Final         Failed - Na in normal range and within 180 days    Sodium  Date Value Ref Range Status  06/13/2022 140 135 - 146 mmol/L Final  01/07/2015 140 136 - 144 mmol/L Final    Comment:    **Effective January 18, 2015 the reference interval**   for Sodium, Serum will be changing to:                                             134 - 144          Failed - eGFR is 30 or above and within 180 days    GFR, Est African American  Date Value Ref Range Status  06/06/2019 122 > OR = 60 mL/min/1.25m2 Final   GFR, Est Non African American  Date Value Ref Range Status  06/06/2019 105 > OR = 60 mL/min/1.83m2 Final   GFR, Estimated  Date Value Ref Range Status  03/08/2021 >60 >60 mL/min Final    Comment:    (NOTE) Calculated using the CKD-EPI Creatinine Equation (2021)    eGFR  Date Value Ref Range Status  06/13/2022 102 > OR = 60 mL/min/1.63m2 Final         Failed - Valid encounter within last 6 months    Recent Outpatient Visits           7 months ago Epigastric abdominal pain   [place] [facility] Smitty Cords, DO   1 year ago Acute cystitis with hematuria   [place] [facility] Smitty Cords, DO   1 year ago Annual physical exam   [place] [facility] Smitty Cords, DO   2 years ago Blepharitis of right upper eyelid, unspecified type   [place] [facility] [place], Netta Neat, DO   3 years ago Prediabetes   [place] [facility]  [place], Netta Neat, [place]              Passed - Patient is not pregnant      Passed - Last BP in normal range    BP Readings from Last 1 Encounters:  06/13/22 138/76         Signed Prescriptions Disp Refills   pantoprazole (PROTONIX) 40 MG tablet 90 tablet 0    Sig: TAKE 1 TABLET BY MOUTH DAILY  BEFORE BREAKFAST     Gastroenterology: Proton Pump Inhibitors Passed - 01/24/2023  1:03 PM      Passed - Valid encounter within last 12 months    Recent Outpatient Visits           7 months ago Epigastric abdominal pain   [facility] Smitty Cords, [facility]   1 year ago Acute cystitis with hematuria   [place] [facility] Smitty Cords, DO   1 year ago Annual physical exam  [place] [facility] [place], Netta Neat, DO   2 years ago Blepharitis of right upper eyelid, unspecified type   [place] [facility] [place], Netta Neat, DO   3 years ago Prediabetes   [place] [facility] [place], Netta Neat, [place]

## 2023-02-09 ENCOUNTER — Telehealth: Payer: Self-pay

## 2023-02-09 DIAGNOSIS — E782 Mixed hyperlipidemia: Secondary | ICD-10-CM

## 2023-02-09 DIAGNOSIS — Z0000 Encounter for general adult medical examination without abnormal findings: Secondary | ICD-10-CM

## 2023-02-09 DIAGNOSIS — I10 Essential (primary) hypertension: Secondary | ICD-10-CM

## 2023-02-09 DIAGNOSIS — R7303 Prediabetes: Secondary | ICD-10-CM

## 2023-02-09 DIAGNOSIS — Z125 Encounter for screening for malignant neoplasm of prostate: Secondary | ICD-10-CM

## 2023-02-09 NOTE — Telephone Encounter (Signed)
 Copied from CRM [id number]. Topic: Appointment Scheduling - Scheduling Inquiry for Clinic >> Feb 05, 2023 11:31 AM Franchot Heidelberg wrote: Reason for CRM: Pt called and has scheduled a CPE and lab appt the week prior. Please upload labs to chart.

## 2023-02-09 NOTE — Addendum Note (Signed)
 Addended by: Smitty Cords on: 02/09/2023 05:19 PM   Modules accepted: Orders

## 2023-02-26 ENCOUNTER — Encounter (INDEPENDENT_AMBULATORY_CARE_PROVIDER_SITE_OTHER): Payer: 59 | Admitting: Family Medicine

## 2023-02-26 DIAGNOSIS — U071 COVID-19: Secondary | ICD-10-CM | POA: Diagnosis not present

## 2023-02-27 MED ORDER — GUAIFENESIN-CODEINE 100-10 MG/5ML PO SOLN: 5.0000 mL | Freq: Three times a day (TID) | ORAL | 0 refills | Status: DC | PRN

## 2023-02-27 NOTE — Telephone Encounter (Signed)
Please see the MyChart message reply(ies) for my assessment and plan.    This patient gave consent for this Medical Advice Message and is aware that it may result in a bill to their insurance company, as well as the possibility of receiving a bill for a co-payment or deductible. They are an established patient, but are not seeking medical advice exclusively about a problem treated during an in person or video visit in the last seven days. I did not recommend an in person or video visit within seven days of my reply.    I spent a total of 5 minutes cumulative time within 7 days through MyChart messaging.  Axelle Szwed, DO   

## 2023-03-07 ENCOUNTER — Encounter: Payer: Self-pay | Admitting: Family Medicine

## 2023-03-08 ENCOUNTER — Other Ambulatory Visit: Payer: 59

## 2023-03-08 DIAGNOSIS — Z125 Encounter for screening for malignant neoplasm of prostate: Secondary | ICD-10-CM

## 2023-03-08 DIAGNOSIS — E782 Mixed hyperlipidemia: Secondary | ICD-10-CM

## 2023-03-08 DIAGNOSIS — I10 Essential (primary) hypertension: Secondary | ICD-10-CM

## 2023-03-08 DIAGNOSIS — R7303 Prediabetes: Secondary | ICD-10-CM

## 2023-03-08 DIAGNOSIS — Z0000 Encounter for general adult medical examination without abnormal findings: Secondary | ICD-10-CM

## 2023-03-09 ENCOUNTER — Encounter: Payer: Self-pay | Admitting: Family Medicine

## 2023-03-09 LAB — COMPLETE METABOLIC PANEL WITH GFR
AG Ratio: 1.8 (calc) (ref 1.0–2.5)
ALT: 56 U/L — ABNORMAL HIGH (ref 9–46)
AST: 54 U/L — ABNORMAL HIGH (ref 10–35)
Albumin: 4.7 g/dL (ref 3.6–5.1)
Alkaline phosphatase (APISO): 102 U/L (ref 35–144)
BUN: 10 mg/dL (ref 7–25)
CO2: 30 mmol/L (ref 20–32)
Calcium: 10.1 mg/dL (ref 8.6–10.3)
Chloride: 102 mmol/L (ref 98–110)
Creat: 0.76 mg/dL (ref 0.70–1.35)
Globulin: 2.6 g/dL (ref 1.9–3.7)
Glucose, Bld: 136 mg/dL — ABNORMAL HIGH (ref 65–99)
Potassium: 4.7 mmol/L (ref 3.5–5.3)
Sodium: 140 mmol/L (ref 135–146)
Total Bilirubin: 0.5 mg/dL (ref 0.2–1.2)
Total Protein: 7.3 g/dL (ref 6.1–8.1)
eGFR: 102 mL/min/{1.73_m2} (ref >=60)

## 2023-03-09 LAB — CBC WITH DIFFERENTIAL/PLATELET
Absolute Lymphocytes: 3311 {cells}/uL (ref 850–3900)
Absolute Monocytes: 601 {cells}/uL (ref 200–950)
Basophils Absolute: 92 {cells}/uL (ref 0–200)
Basophils Relative: 1.2 %
Eosinophils Absolute: 200 {cells}/uL (ref 15–500)
Eosinophils Relative: 2.6 %
HCT: 48.2 % (ref 38.5–50.0)
Hemoglobin: 16 g/dL (ref 13.2–17.1)
MCH: 31.9 pg (ref 27.0–33.0)
MCHC: 33.2 g/dL (ref 32.0–36.0)
MCV: 96.2 fL (ref 80.0–100.0)
MPV: 11.7 fL (ref 7.5–12.5)
Monocytes Relative: 7.8 %
Neutro Abs: 3496 {cells}/uL (ref 1500–7800)
Neutrophils Relative %: 45.4 %
Platelets: 222 10*3/uL (ref 140–400)
RBC: 5.01 10*6/uL (ref 4.20–5.80)
RDW: 12 % (ref 11.0–15.0)
Total Lymphocyte: 43 %
WBC: 7.7 10*3/uL (ref 3.8–10.8)

## 2023-03-09 LAB — LIPID PANEL
Cholesterol: 196 mg/dL (ref <200)
HDL: 59 mg/dL (ref >=40)
LDL Cholesterol (Calc): 107 mg/dL — ABNORMAL HIGH
Non-HDL Cholesterol (Calc): 137 mg/dL — ABNORMAL HIGH (ref <130)
Total CHOL/HDL Ratio: 3.3 (calc) (ref <5.0)
Triglycerides: 182 mg/dL — ABNORMAL HIGH (ref <150)

## 2023-03-09 LAB — PSA: PSA: 1.45 ng/mL (ref <=4.00)

## 2023-03-09 LAB — HEMOGLOBIN A1C
Hgb A1c MFr Bld: 6.9 %{Hb} — ABNORMAL HIGH (ref <5.7)
Mean Plasma Glucose: 151 mg/dL
eAG (mmol/L): 8.4 mmol/L

## 2023-03-09 LAB — TSH: TSH: 3.05 m[IU]/L (ref 0.40–4.50)

## 2023-03-15 ENCOUNTER — Ambulatory Visit (INDEPENDENT_AMBULATORY_CARE_PROVIDER_SITE_OTHER): Payer: 59 | Admitting: Family Medicine

## 2023-03-15 ENCOUNTER — Encounter: Payer: Self-pay | Admitting: Family Medicine

## 2023-03-15 VITALS — BP 142/74 | HR 85 | Ht 72.0 in | Wt 220.0 lb

## 2023-03-15 DIAGNOSIS — I10 Essential (primary) hypertension: Secondary | ICD-10-CM | POA: Diagnosis not present

## 2023-03-15 DIAGNOSIS — K219 Gastro-esophageal reflux disease without esophagitis: Secondary | ICD-10-CM

## 2023-03-15 DIAGNOSIS — R7303 Prediabetes: Secondary | ICD-10-CM | POA: Diagnosis not present

## 2023-03-15 DIAGNOSIS — E782 Mixed hyperlipidemia: Secondary | ICD-10-CM | POA: Diagnosis not present

## 2023-03-15 DIAGNOSIS — R011 Cardiac murmur, unspecified: Secondary | ICD-10-CM

## 2023-03-15 DIAGNOSIS — Z0000 Encounter for general adult medical examination without abnormal findings: Secondary | ICD-10-CM

## 2023-03-15 MED ORDER — PANTOPRAZOLE SODIUM 40 MG PO TBEC: 40.0000 mg | DELAYED_RELEASE_TABLET | Freq: Every day | ORAL | 3 refills | Status: DC

## 2023-03-15 NOTE — Progress Notes (Signed)
 Subjective:    Patient ID: ERWIN NISHIYAMA, male    DOB: [birth year], 63 y.o.   MRN: [id number]  VINAL ROSENGRANT is a 63 y.o. male presenting on 03/15/2023 for Annual Exam   HPI  Discussed the use of AI scribe software for clinical note transcription with the patient, who gave verbal consent to proceed.  History of Present Illness    Omere Marti is a 63 year old male, who has retired, who presents for an annual physical exam.  His blood sugar level is 6.9, which is above the desired range. He is currently taking metformin  and is motivated to improve his lifestyle to manage his blood sugar levels. His weight has remained stable at 220 pounds over the past year, but he aims to reduce it to 200 pounds. Admits issue with diet with higher sugar content that has affected this.  Elevated BP today.  He has a family history of heart issues, with two brothers and his father having passed away from heart-related conditions. A CT scan for his lungs in July revealed calcifications in the aortic valve and some atherosclerosis in the coronary arteries. He is currently taking a statin for cholesterol management and notes that his cholesterol and triglyceride levels have increased slightly.  Needs refill PPI  He had COVID-19 three weeks ago, which led him to use over-the-counter cough medicine that was ineffective. He tested positive twice during that period.   He is up to date with his vaccinations, having received the flu shot in early November, the COVID booster in late November, and the shingles vaccine in December. He received these vaccinations at [facility] and attempted to share this information with his primary care provider.       Health Maintenance: UTD vaccines currently.     06/13/2022    2:42 PM 11/01/2021    8:19 AM 09/13/2021    5:27 PM  Depression screen PHQ 2/9  Decreased Interest 0 0 0  Down, Depressed, Hopeless 0 0 0  PHQ - 2 Score 0 0 0  Altered sleeping  0   Tired,  decreased energy  0   Change in appetite  0   Feeling bad or failure about yourself   0   Trouble concentrating  0   Moving slowly or fidgety/restless  0   Suicidal thoughts  0   PHQ-9 Score  0   Difficult doing work/chores  Not difficult at all        06/13/2022    2:42 PM 11/01/2021    8:19 AM 12/12/2018   11:31 AM 07/19/2018    9:09 AM  GAD 7 : Generalized Anxiety Score  Nervous, Anxious, on Edge 0 0 0 2  Control/stop worrying 0 0 0 1  Worry too much - different things 0 0 0 0  Trouble relaxing 0 0 0 0  Restless 0 0 0 0  Easily annoyed or irritable 0 0 0 0  Afraid - awful might happen 0 0 0 0  Total GAD 7 Score 0 0 0 3  Anxiety Difficulty  Not difficult at all  Not difficult at all     Past Medical History:  Diagnosis Date   GERD (gastroesophageal reflux disease)    Hypertension    Pre-diabetes    Past Surgical History:  Procedure Laterality Date   APPENDECTOMY     COLONOSCOPY     COLONOSCOPY WITH PROPOFOL  N/A 11/07/2017   Procedure: COLONOSCOPY WITH PROPOFOL ;  Surgeon: Unk Cotton  Jess, MD;  [facility]: [facility];  Service: Endoscopy;  Laterality: N/A;   INGUINAL HERNIA REPAIR Bilateral    NASAL SEPTOPLASTY W/ TURBINOPLASTY  2009   Dr Herminio ENT   Social History   Socioeconomic History   Marital status: Married    Spouse name: Not on file   Number of children: Not on file   Years of education: Not on file   Highest education level: Associate degree: academic program  Occupational History   Not on file  Tobacco Use   Smoking status: Former    Current packs/day: 0.00    Average packs/day: 1 pack/day for 35.0 years (35.0 ttl pk-yrs)    Types: Cigarettes    Start date: 01/07/1979    Quit date: 01/06/2014    Years since quitting: 9.1   Smokeless tobacco: Former  Building Services Engineer status: Never Used  Substance and Sexual Activity   Alcohol use: Yes    Alcohol/week: 6.0 standard drinks of alcohol    Types: 6 Cans of beer per week   Drug use: No    Sexual activity: Not on file  Other Topics Concern   Not on file  Social History Narrative   Not on file   Social Drivers of Health   Financial Resource Strain: Low Risk  (03/14/2023)   Overall Financial Resource Strain (CARDIA)    Difficulty of Paying Living Expenses: Not hard at all  Food Insecurity: No Food Insecurity (03/14/2023)   Hunger Vital Sign    Worried About Running Out of Food in the Last Year: Never true    Ran Out of Food in the Last Year: Never true  Transportation Needs: No Transportation Needs (03/14/2023)   PRAPARE - Administrator, Civil Service (Medical): No    Lack of Transportation (Non-Medical): No  Physical Activity: Insufficiently Active (03/14/2023)   Exercise Vital Sign    Days of Exercise per Week: 2 days    Minutes of Exercise per Session: 30 min  Stress: No Stress Concern Present (03/14/2023)   [employer] of Occupational Health - Occupational Stress Questionnaire    Feeling of Stress : Only a little  Social Connections: Moderately Integrated (03/14/2023)   Social Connection and Isolation Panel [NHANES]    Frequency of Communication with Friends and Family: More than three times a week    Frequency of Social Gatherings with Friends and Family: Once a week    Attends Religious Services: More than 4 times per year    Active Member of [employer] or Organizations: No    Attends Engineer, Structural: Not on file    Marital Status: Married  Catering Manager Violence: Not on file   Family History  Problem Relation Age of Onset   Cancer Mother        breast   Clotting disorder Father    Heart attack Father    Heart attack Brother    Pancreatic cancer Brother 43       pancreatic   Current Outpatient Medications on File Prior to Visit  Medication Sig   albuterol  (VENTOLIN  HFA) 108 (90 Base) MCG/ACT inhaler TAKE 2 PUFFS BY MOUTH EVERY 6 HOURS AS NEEDED FOR WHEEZE OR SHORTNESS OF BREATH   amLODipine -benazepril  (LOTREL) 5-10 MG capsule  TAKE 1 CAPSULE BY MOUTH DAILY   atorvastatin  (LIPITOR) 20 MG tablet TAKE 1 TABLET BY MOUTH DAILY   cetirizine  (ZYRTEC ) 10 MG tablet Take 10 mg by mouth daily.   fluticasone  (FLONASE ) 50  MCG/ACT nasal spray Place 2 sprays into both nostrils daily.   hydrocortisone  (ANUSOL -HC) 25 MG suppository Place 1 suppository (25 mg total) rectally 2 (two) times daily. For 7 days   Insulin  Pen Needle 32G X 6 MM MISC Use to inject Saxenda  into skin daily.   metFORMIN  (GLUCOPHAGE ) 500 MG tablet TAKE 1 TABLET BY MOUTH DAILY  WITH BREAKFAST   Multiple Vitamin (MULTIVITAMIN) tablet Take 1 tablet by mouth daily. Men's a day   dicyclomine  (BENTYL ) 10 MG capsule Take 1 capsule (10 mg total) by mouth 4 (four) times daily -  before meals and at bedtime. (Patient not taking: Reported on 03/15/2023)   No current facility-administered medications on file prior to visit.    Review of Systems Per HPI unless specifically indicated above     Objective:    BP (!) 142/74 (BP Location: Left Arm, Cuff Size: Normal)   Pulse 85   Ht 6' (1.829 m)   Wt 220 lb (99.8 kg)   SpO2 97%   BMI 29.84 kg/m   Wt Readings from Last 3 Encounters:  03/15/23 220 lb (99.8 kg)  06/13/22 224 lb (101.6 kg)  11/01/21 224 lb (101.6 kg)    Physical Exam Vitals and nursing note reviewed.  Constitutional:      General: He is not in acute distress.    Appearance: He is well-developed. He is not diaphoretic.     Comments: Well-appearing, comfortable, cooperative  HENT:     Head: Normocephalic and atraumatic.  Eyes:     General:        Right eye: No discharge.        Left eye: No discharge.     Conjunctiva/sclera: Conjunctivae normal.     Pupils: Pupils are equal, round, and reactive to light.  Neck:     Thyroid: No thyromegaly.  Cardiovascular:     Rate and Rhythm: Normal rate and regular rhythm.     Pulses: Normal pulses.     Heart sounds: Murmur (1/6 systolic murmur) heard.  Pulmonary:     Effort: Pulmonary effort is normal. No  respiratory distress.     Breath sounds: Normal breath sounds. No wheezing or rales.  Abdominal:     General: Bowel sounds are normal. There is no distension.     Palpations: Abdomen is soft. There is no mass.     Tenderness: There is no abdominal tenderness.  Musculoskeletal:        General: No tenderness. Normal range of motion.     Cervical back: Normal range of motion and neck supple.     Comments: Upper / Lower Extremities: - Normal muscle tone, strength bilateral upper extremities 5/5, lower extremities 5/5  Lymphadenopathy:     Cervical: No cervical adenopathy.  Skin:    General: Skin is warm and dry.     Findings: No erythema or rash.  Neurological:     Mental Status: He is alert and oriented to person, place, and time.     Comments: Distal sensation intact to light touch all extremities  Psychiatric:        Mood and Affect: Mood normal.        Behavior: Behavior normal.        Thought Content: Thought content normal.     Comments: Well groomed, good eye contact, normal speech and thoughts     I have personally reviewed the radiology report from 09/05/22 on LDCT.  CLINICAL DATA:  62 year old male former smoker (quit 9 years ago)  with 35 pack-year history of smoking. Lung cancer screening examination.   EXAM: CT CHEST WITHOUT CONTRAST LOW-DOSE FOR LUNG CANCER SCREENING   TECHNIQUE: Multidetector CT imaging of the chest was performed following the standard protocol without IV contrast.   RADIATION DOSE REDUCTION: This exam was performed according to the departmental dose-optimization program which includes automated exposure control, adjustment of the mA and/or kV according to patient size and/or use of iterative reconstruction technique.   COMPARISON:  Low-dose lung cancer screening chest CT 08/26/2021.   FINDINGS: Cardiovascular: Heart size is normal. There is no significant pericardial fluid, thickening or pericardial calcification. There is aortic  atherosclerosis, as well as atherosclerosis of the great vessels of the mediastinum and the coronary arteries, including calcified atherosclerotic plaque in the left anterior descending and right coronary arteries. Calcifications of the aortic valve.   Mediastinum/Nodes: No pathologically enlarged mediastinal or hilar lymph nodes. Please note that accurate exclusion of hilar adenopathy is limited on noncontrast CT scans. Esophagus is unremarkable in appearance. No axillary lymphadenopathy.   Lungs/Pleura: Multiple pulmonary nodules are again noted throughout the lungs bilaterally, similar in size and number to prior studies, largest of which is in the left lower lobe (axial image 236) with a volume derived mean diameter of 7.3 mm, similar to numerous prior examinations. No other larger more suspicious appearing pulmonary nodules or masses are noted. No acute consolidative airspace disease.   Upper Abdomen: Diffuse low attenuation throughout the visualized hepatic parenchyma, indicative of a background of hepatic steatosis. Aortic atherosclerosis.   Musculoskeletal: There are no aggressive appearing lytic or blastic lesions noted in the visualized portions of the skeleton.   IMPRESSION: 1. Lung-RADS 2S, benign appearance or behavior. Continue annual screening with low-dose chest CT without contrast in 12 months. 2. The S modifier above refers to potentially clinically significant non lung cancer related findings. Specifically, there is aortic atherosclerosis, in addition to two-vessel coronary artery disease. Please note that although the presence of coronary artery calcium  documents the presence of coronary artery disease, the severity of this disease and any potential stenosis cannot be assessed on this non-gated CT examination. Assessment for potential risk factor modification, dietary therapy or pharmacologic therapy may be warranted, if clinically indicated. 3. Mild diffuse  bronchial wall thickening with mild centrilobular and paraseptal emphysema; imaging findings suggestive of underlying COPD. 4. Hepatic steatosis. 5. There are calcifications of the aortic valve. Echocardiographic correlation for evaluation of potential valvular dysfunction may be warranted if clinically indicated.   Aortic Atherosclerosis (ICD10-I70.0) and Emphysema (ICD10-J43.9).     Electronically Signed   By: Toribio Aye M.D.   On: 09/05/2022 07:24    Results for orders placed or performed in visit on 03/08/23  TSH   Collection Time: 03/08/23  7:58 AM  Result Value Ref Range   TSH 3.05 0.40 - 4.50 mIU/L  PSA   Collection Time: 03/08/23  7:58 AM  Result Value Ref Range   PSA 1.45 < OR = 4.00 ng/mL  COMPLETE METABOLIC PANEL WITH GFR   Collection Time: 03/08/23  7:58 AM  Result Value Ref Range   Glucose, Bld 136 (H) 65 - 99 mg/dL   BUN 10 7 - 25 mg/dL   Creat 9.23 9.29 - 8.64 mg/dL   eGFR 897 > OR = 60 fO/fpw/8.26f7   BUN/Creatinine Ratio SEE NOTE: 6 - 22 (calc)   Sodium 140 135 - 146 mmol/L   Potassium 4.7 3.5 - 5.3 mmol/L   Chloride 102 98 - 110 mmol/L  CO2 30 20 - 32 mmol/L   Calcium  10.1 8.6 - 10.3 mg/dL   Total Protein 7.3 6.1 - 8.1 g/dL   Albumin 4.7 3.6 - 5.1 g/dL   Globulin 2.6 1.9 - 3.7 g/dL (calc)   AG Ratio 1.8 1.0 - 2.5 (calc)   Total Bilirubin 0.5 0.2 - 1.2 mg/dL   Alkaline phosphatase (APISO) 102 35 - 144 U/L   AST 54 (H) 10 - 35 U/L   ALT 56 (H) 9 - 46 U/L  CBC with Differential/Platelet   Collection Time: 03/08/23  7:58 AM  Result Value Ref Range   WBC 7.7 3.8 - 10.8 Thousand/uL   RBC 5.01 4.20 - 5.80 Million/uL   Hemoglobin 16.0 13.2 - 17.1 g/dL   HCT 51.7 61.4 - 49.9 %   MCV 96.2 80.0 - 100.0 fL   MCH 31.9 27.0 - 33.0 pg   MCHC 33.2 32.0 - 36.0 g/dL   RDW 87.9 88.9 - 84.9 %   Platelets 222 140 - 400 Thousand/uL   MPV 11.7 7.5 - 12.5 fL   Neutro Abs 3,496 1,500 - 7,800 cells/uL   Absolute Lymphocytes 3,311 850 - 3,900 cells/uL    Absolute Monocytes 601 200 - 950 cells/uL   Eosinophils Absolute 200 15 - 500 cells/uL   Basophils Absolute 92 0 - 200 cells/uL   Neutrophils Relative % 45.4 %   Total Lymphocyte 43.0 %   Monocytes Relative 7.8 %   Eosinophils Relative 2.6 %   Basophils Relative 1.2 %   Smear Review    Hemoglobin A1c   Collection Time: 03/08/23  7:58 AM  Result Value Ref Range   Hgb A1c MFr Bld 6.9 (H) <5.7 % of total Hgb   Mean Plasma Glucose 151 mg/dL   eAG (mmol/L) 8.4 mmol/L  Lipid panel   Collection Time: 03/08/23  7:58 AM  Result Value Ref Range   Cholesterol 196 <200 mg/dL   HDL 59 > OR = 40 mg/dL   Triglycerides 817 (H) <150 mg/dL   LDL Cholesterol (Calc) 107 (H) mg/dL (calc)   Total CHOL/HDL Ratio 3.3 <5.0 (calc)   Non-HDL Cholesterol (Calc) 137 (H) <130 mg/dL (calc)      Assessment & Plan:   Problem List Items Addressed This Visit     Essential (primary) hypertension   Relevant Orders   CT CARDIAC SCORING (SELF PAY ONLY)   GERD (gastroesophageal reflux disease)   Relevant Medications   pantoprazole  (PROTONIX ) 40 MG tablet   Hyperlipidemia   Relevant Orders   CT CARDIAC SCORING (SELF PAY ONLY)   Prediabetes   Other Visit Diagnoses       Annual physical exam    -  Primary     Systolic murmur            Updated Health Maintenance information Reviewed recent lab results with patient Encouraged improvement to lifestyle with diet and exercise Goal of weight loss  Prediabetes At risk Type 2 Diabetes A1c of 6.9, slightly above the threshold of 6.5. Patient is confident in lifestyle modifications to improve this. Weight stable at 220 lbs. Currently on Metformin . -Continue Metformin  500mg  daily as currently prescribed. -Encourage lifestyle modifications to improve A1c and weight. -Consider use of over-the-counter Dexcom Stelo for continuous glucose monitoring to better understand blood sugar patterns. -Check A1c in 6 months.  Hyperlipidemia Cholesterol and triglycerides  slightly elevated. Patient is currently on a statin. -Continue current statin therapy. -Encourage lifestyle modifications to improve lipid profile.  Cardiovascular Health Calcifications  noted on aortic valve and in coronary arteries from previous CT scan. Mild heart murmur detected on physical exam. -Order coronary calcium  score CT scan to further evaluate coronary arteries. -Consider echocardiogram in the future based on CT scan results.  General Health Maintenance Up to date on vaccines including shingles, flu, and COVID-19 booster.  Medication Refill Pantoprazole  may need additional refills. -Refill Pantoprazole  prescription with additional refills.       Orders Placed This Encounter  Procedures   CT CARDIAC SCORING (SELF PAY ONLY)    Standing Status:   Future    Expiration Date:   03/14/2024    Preferred imaging location?:   [facility]    Meds ordered this encounter  Medications   pantoprazole  (PROTONIX ) 40 MG tablet    Sig: Take 1 tablet (40 mg total) by mouth daily before breakfast.    Dispense:  90 tablet    Refill:  3     Follow up plan: Return for 6 month PreDM A1c.  Marsa Officer, DO [facility] 03/15/2023, 8:13 AM

## 2023-03-15 NOTE — Patient Instructions (Addendum)
 Thank you for coming to the office today.  Recent Labs    03/08/23 0758  HGBA1C 6.9*   Goal to improve lifestyle blood sugar / cholesterol Aim to get A1c < 6.5%  Highly recommend OTC pharmacy purchase - Dexcom STELO (no insurance required 70-90 per month) - Log sugars all day and keep track / learn  Possible mild Heart Murmur sound, we can investigate with ECHO after the CT scan.  You have been referred for a Coronary Calcium  Score Cardiac CT Scan. This is a screening test for patients aged 78-50+ with cardiovascular risk factors or who are healthy but would be interested in Cardiovascular Screening for heart disease. Even if there is a family history of heart disease, this imaging can be useful. Typically it can be done every 5+ years or at a different timeline we agree on  The scan will look at the chest and mainly focus on the heart and identify early signs of calcium  build up or blockages within the heart arteries. It is not 100% accurate for identifying blockages or heart disease, but it is useful to help us  predict who may have some early changes or be at risk in the future for a heart attack or cardiovascular problem.  The results are reviewed by a Cardiologist and they will document the results. It should become available on MyChart. Typically the results are divided into percentiles based on other patients of the same demographic and age. So it will compare your risk to others similar to you. If you have a higher score >99 or higher percentile >75%tile, it is recommended to consider Statin cholesterol therapy and or referral to Cardiologist. I will try to help explain your results and if we have questions we can contact the Cardiologist.  You will be contacted for scheduling. Usually it is done at any imaging facility through [facility], [facility] or [facility].  The cost is $99 flat fee total and it does not go through insurance, so no  authorization is required.   DUE for FASTING BLOOD WORK (no food or drink after midnight before the lab appointment, only water  or coffee without cream/sugar on the morning of)  SCHEDULE Lab Only visit in the morning at the clinic for lab draw in 6 MONTHS   - Make sure Lab Only appointment is at about 1 week before your next appointment, so that results will be available  For Lab Results, once available within 2-3 days of blood draw, you can can log in to MyChart online to view your results and a brief explanation. Also, we can discuss results at next follow-up visit.    Please schedule a Follow-up Appointment to: Return for 6 month PreDM A1c.  If you have any other questions or concerns, please feel free to call the office or send a message through MyChart. You may also schedule an earlier appointment if necessary.  Additionally, you may be receiving a survey about your experience at our office within a few days to 1 week by e-mail or mail. We value your feedback.  Marsa Officer, DO [facility], [place]

## 2023-03-16 ENCOUNTER — Other Ambulatory Visit: Payer: Self-pay | Admitting: Family Medicine

## 2023-03-16 ENCOUNTER — Ambulatory Visit
Admission: RE | Admit: 2023-03-16 | Discharge: 2023-03-16 | Disposition: A | Discharge status: Home / Self Care | Payer: Self-pay | Source: Ambulatory Visit | Attending: Family Medicine | Admitting: Family Medicine

## 2023-03-16 ENCOUNTER — Encounter: Payer: Self-pay | Admitting: Family Medicine

## 2023-03-16 DIAGNOSIS — I10 Essential (primary) hypertension: Secondary | ICD-10-CM

## 2023-03-16 DIAGNOSIS — R931 Abnormal findings on diagnostic imaging of heart and coronary circulation: Secondary | ICD-10-CM

## 2023-03-16 DIAGNOSIS — E782 Mixed hyperlipidemia: Secondary | ICD-10-CM

## 2023-03-16 DIAGNOSIS — E785 Hyperlipidemia, unspecified: Secondary | ICD-10-CM

## 2023-03-16 DIAGNOSIS — R011 Cardiac murmur, unspecified: Secondary | ICD-10-CM

## 2023-03-20 ENCOUNTER — Encounter: Payer: Self-pay | Admitting: Cardiology

## 2023-03-20 ENCOUNTER — Ambulatory Visit: Payer: 59 | Attending: Cardiology | Admitting: Cardiology

## 2023-03-20 VITALS — BP 150/78 | HR 81 | Ht 72.0 in | Wt 226.0 lb

## 2023-03-20 DIAGNOSIS — I2089 Other forms of angina pectoris: Secondary | ICD-10-CM

## 2023-03-20 DIAGNOSIS — E782 Mixed hyperlipidemia: Secondary | ICD-10-CM | POA: Diagnosis not present

## 2023-03-20 DIAGNOSIS — I10 Essential (primary) hypertension: Secondary | ICD-10-CM | POA: Diagnosis not present

## 2023-03-20 DIAGNOSIS — E785 Hyperlipidemia, unspecified: Secondary | ICD-10-CM | POA: Diagnosis not present

## 2023-03-20 DIAGNOSIS — I25118 Atherosclerotic heart disease of native coronary artery with other forms of angina pectoris: Secondary | ICD-10-CM

## 2023-03-20 DIAGNOSIS — I2510 Atherosclerotic heart disease of native coronary artery without angina pectoris: Secondary | ICD-10-CM

## 2023-03-20 MED ORDER — ATORVASTATIN CALCIUM 40 MG PO TABS: 40.0000 mg | ORAL_TABLET | Freq: Every day | ORAL | 0 refills | Status: DC

## 2023-03-20 MED ORDER — ASPIRIN 81 MG PO TBEC
81.0000 mg | DELAYED_RELEASE_TABLET | Freq: Every day | ORAL | Status: AC
Start: 2023-03-20 — End: ?

## 2023-03-20 NOTE — Patient Instructions (Signed)
Medication Instructions:   Your physician recommends that you continue on your current medications as directed. Please refer to the Current Medication list given to you today.  *If you need a refill on your cardiac medications before your next appointment, please call your pharmacy*   Lab Work:  None Ordered  If you have labs (blood work) drawn today and your tests are completely normal, you will receive your results only by: MyChart Message (if you have MyChart) OR A paper copy in the mail If you have any lab test that is abnormal or we need to change your treatment, we will call you to review the results.   Testing/Procedures:  Your physician has requested that you have an echocardiogram. Echocardiography is a painless test that uses sound waves to create images of your heart. It provides your doctor with information about the size and shape of your heart and how well your heart's chambers and valves are working. This procedure takes approximately one hour. There are no restrictions for this procedure. Please do NOT wear cologne, perfume, aftershave, or lotions (deodorant is allowed). Please arrive 15 minutes prior to your appointment time.  Please note: We ask at that you not bring children with you during ultrasound (echo/ vascular) testing. Due to room size and safety concerns, children are not allowed in the ultrasound rooms during exams. Our front office staff cannot provide observation of children in our lobby area while testing is being conducted. An adult accompanying a patient to their appointment will only be allowed in the ultrasound room at the discretion of the ultrasound technician under special circumstances. We apologize for any inconvenience.  [facility] MYOVIEW  Your Provider has ordered a Stress Test with nuclear imaging. The purpose of this test is to evaluate the blood supply to your heart muscle. This procedure is referred to as a "Non-Invasive Stress Test." This is  because other than having an IV started in your vein, nothing is inserted or "invades" your body. Cardiac stress tests are done to find areas of poor blood flow to the heart by determining the extent of coronary artery disease (CAD). Some patients exercise on a treadmill, which naturally increases the blood flow to your heart, while others who are unable to walk on a treadmill due to physical limitations have a pharmacologic/chemical stress agent called Lexiscan. This medicine will mimic walking on a treadmill by temporarily increasing your coronary blood flow.     REPORT TO [facility] MEDICAL MALL ENTRANCE  **Proceed to the 1st desk on the right, REGISTRATION, to check in**  Please note: this test may take anywhere between 2-4 hours to complete    Instructions regarding medication:   _XXX__:   You may take all of your regular morning medications the day of your test unless listed below.    How to prepare for your Myoview test:  Do not eat or drink for 6 hours prior to the test No caffeine for 24 hours prior to the test No smoking 24 hours prior to the test. Ladies, please do not wear dresses.  Skirts or pants are appropriate. Please wear a short sleeve shirt. No perfume, cologne or lotion. Wear comfortable walking shoes. No heels!   PLEASE NOTIFY THE OFFICE AT LEAST 24 HOURS IN ADVANCE IF YOU ARE UNABLE TO KEEP YOUR APPOINTMENT.  [phone] AND  PLEASE NOTIFY NUCLEAR MEDICINE AT [facility] AT LEAST 24 HOURS IN ADVANCE IF YOU ARE UNABLE TO KEEP YOUR APPOINTMENT. [phone]      Follow-Up: At  [facility], you and your health needs are our priority.  As part of our continuing mission to provide you with exceptional heart care, we have created designated Provider Care Teams.  These Care Teams include your primary Cardiologist (physician) and Advanced Practice Providers (APPs -  Physician Assistants and Nurse Practitioners) who all work together to provide you with the care you need,  when you need it.  We recommend signing up for the patient portal called "MyChart".  Sign up information is provided on this After Visit Summary.  MyChart is used to connect with patients for Virtual Visits (Telemedicine).  Patients are able to view lab/test results, encounter notes, upcoming appointments, etc.  Non-urgent messages can be sent to your provider as well.   To learn more about what you can do with MyChart, go to [email or web address].    Your next appointment:    After Cardiac testing  Provider:   You may see Debbe Odea, MD or one of the following Advanced Practice Providers on your designated Care Team:   Nicolasa Ducking, NP Eula Listen, PA-C Cadence Fransico Michael, PA-C Charlsie Quest, NP Carlos Levering, NP

## 2023-03-20 NOTE — Progress Notes (Signed)
Cardiology Office Note:    Date:  03/20/2023   ID:  Jeremy [name], DOB [birth year], MRN [id number]  PCP:  Smitty Cords, DO   [facility] Providers Cardiologist:  Debbe Odea, MD     Referring MD: Saralyn Pilar *   Chief Complaint  Patient presents with   New Patient (Initial Visit)    Referred for cardiac evaluation of elevated coronary artery calcium score, mixed hyperlipidemia, systolic murmur, and essential (primary) hypertension.     Jeremy [name] is a 63 y.o. male who is being seen today for the evaluation of CAD at the request of Saralyn Pilar *.   History of Present Illness:    Jeremy [name] is a 63 y.o. male with a hx of CAD (calcium score 1245 on 2/25), hypertension, hyperlipidemia, former smoker x 20 years presenting due to coronary calcifications.  Patient had a calcium score on 03/16/2023 showing value of 1245, involving LAD, LCx, RCA, 96th percentile.  His Lipitor was increased to 40 mg daily after coronary calcium scan results.  Aspirin 81 mg daily also started.  He endorsed shortness of breath with exertion, denies chest pain.  Father and 2 brothers had heart attacks in their mid to late 23s.  Past Medical History:  Diagnosis Date   GERD (gastroesophageal reflux disease)    Hypertension    Pre-diabetes     Past Surgical History:  Procedure Laterality Date   APPENDECTOMY     COLONOSCOPY     COLONOSCOPY WITH PROPOFOL N/A 11/07/2017   Procedure: COLONOSCOPY WITH PROPOFOL;  Surgeon: Toney Reil, MD;  Location: [facility];  Service: Endoscopy;  Laterality: N/A;   INGUINAL HERNIA REPAIR Bilateral    NASAL SEPTOPLASTY W/ TURBINOPLASTY  2009   Dr [facility]    Current Medications: Current Meds  Medication Sig   albuterol (VENTOLIN HFA) 108 (90 Base) MCG/ACT inhaler TAKE 2 PUFFS BY MOUTH EVERY 6 HOURS AS NEEDED FOR WHEEZE OR SHORTNESS OF BREATH   amLODipine-benazepril (LOTREL) 5-10 MG  capsule TAKE 1 CAPSULE BY MOUTH DAILY   aspirin EC 81 MG tablet Take 1 tablet (81 mg total) by mouth daily. Swallow whole.   cetirizine (ZYRTEC) 10 MG tablet Take 10 mg by mouth daily.   fluticasone (FLONASE) 50 MCG/ACT nasal spray Place 2 sprays into both nostrils daily.   metFORMIN (GLUCOPHAGE) 500 MG tablet TAKE 1 TABLET BY MOUTH DAILY  WITH BREAKFAST   Multiple Vitamin (MULTIVITAMIN) tablet Take 1 tablet by mouth daily. Men's a day   pantoprazole (PROTONIX) 40 MG tablet Take 1 tablet (40 mg total) by mouth daily before breakfast.   [DISCONTINUED] atorvastatin (LIPITOR) 20 MG tablet Take 2 tablets (40 mg total) by mouth daily.     Allergies:   Cephalexin   Social History   Socioeconomic History   Marital status: Married    Spouse name: Not on file   Number of children: Not on file   Years of education: Not on file   Highest education level: Associate degree: academic program  Occupational History   Not on file  Tobacco Use   Smoking status: Former    Current packs/day: 0.00    Average packs/day: 1 pack/day for 35.0 years (35.0 ttl pk-yrs)    Types: Cigarettes    Start date: 01/07/1979    Quit date: 01/06/2014    Years since quitting: 9.2   Smokeless tobacco: Former  Building services engineer status: Never Used  Substance and Sexual Activity  Alcohol use: Yes    Alcohol/week: 6.0 standard drinks of alcohol    Types: 6 Cans of beer per week   Drug use: No   Sexual activity: Not on file  Other Topics Concern   Not on file  Social History Narrative   Not on file   Social Drivers of Health   Financial Resource Strain: Low Risk  (03/14/2023)   Overall Financial Resource Strain (CARDIA)    Difficulty of Paying Living Expenses: Not hard at all  Food Insecurity: No Food Insecurity (03/14/2023)   Hunger Vital Sign    Worried About Running Out of Food in the Last Year: Never true    Ran Out of Food in the Last Year: Never true  Transportation Needs: No Transportation Needs  (03/14/2023)   PRAPARE - Administrator, Civil Service (Medical): No    Lack of Transportation (Non-Medical): No  Physical Activity: Insufficiently Active (03/14/2023)   Exercise Vital Sign    Days of Exercise per Week: 2 days    Minutes of Exercise per Session: 30 min  Stress: No Stress Concern Present (03/14/2023)   [employer] of Occupational Health - Occupational Stress Questionnaire    Feeling of Stress : Only a little  Social Connections: Moderately Integrated (03/14/2023)   Social Connection and Isolation Panel [NHANES]    Frequency of Communication with Friends and Family: More than three times a week    Frequency of Social Gatherings with Friends and Family: Once a week    Attends Religious Services: More than 4 times per year    Active Member of [employer] or Organizations: No    Attends Engineer, structural: Not on file    Marital Status: Married     Family History: The patient's family history includes Cancer in his mother; Clotting disorder in his father; Heart attack in his brother and father; Pancreatic cancer (age of onset: 46) in his brother.  ROS:   Please see the history of present illness.     All other systems reviewed and are negative.  EKGs/Labs/Other Studies Reviewed:    The following studies were reviewed today:  EKG Interpretation Date/Time:  Tuesday March 20 2023 08:17:00 EST Ventricular Rate:  81 PR Interval:  154 QRS Duration:  80 QT Interval:  368 QTC Calculation: 427 R Axis:   13  Text Interpretation: Normal sinus rhythm Normal ECG Confirmed by Debbe Odea ([id number]) on 03/20/2023 8:18:37 AM    Recent Labs: 03/08/2023: ALT 56; BUN 10; Creat 0.76; Hemoglobin 16.0; Platelets 222; Potassium 4.7; Sodium 140; TSH 3.05  Recent Lipid Panel    Component Value Date/Time   CHOL 196 03/08/2023 0758   CHOL 216 (H) 01/07/2015 1003   TRIG 182 (H) 03/08/2023 0758   HDL 59 03/08/2023 0758   HDL 58 01/07/2015 1003   CHOLHDL 3.3  03/08/2023 0758   VLDL 33 (H) 08/24/2016 0847   LDLCALC 107 (H) 03/08/2023 0758     Risk Assessment/Calculations:          Physical Exam:    VS:  BP (!) 150/78 (BP Location: Left Arm, Patient Position: Sitting, Cuff Size: Large)   Pulse 81   Ht 6' (1.829 m)   Wt 226 lb (102.5 kg)   SpO2 98%   BMI 30.65 kg/m     Wt Readings from Last 3 Encounters:  03/20/23 226 lb (102.5 kg)  03/15/23 220 lb (99.8 kg)  06/13/22 224 lb (101.6 kg)     GEN:  Well nourished, well developed in no acute distress HEENT: Normal NECK: No JVD; No carotid bruits CARDIAC: RRR, 2/6 systolic murmur RESPIRATORY:  Clear to auscultation without rales, wheezing or rhonchi  ABDOMEN: Soft, non-tender, non-distended MUSCULOSKELETAL:  No edema; No deformity  SKIN: Warm and dry NEUROLOGIC:  Alert and oriented x 3 PSYCHIATRIC:  Normal affect   ASSESSMENT:    1. Anginal equivalent (HCC)   2. Coronary artery disease involving native coronary artery of native heart, unspecified whether angina present   3. Primary hypertension   4. Mixed hyperlipidemia   5. Hyperlipidemia, unspecified hyperlipidemia type    PLAN:    In order of problems listed above:  Dyspnea on exertion, this could be an anginal equivalent.  Coronary calcification.  Get echo, get Lexiscan Myoview. CAD (calcium score 1245 -LAD, RCA, LCx calcifications).  Lexiscan, Myoview as above.  Aspirin 81 mg daily, Lipitor 40 mg daily. Hypertension, BP elevated usually better controlled.  Titrate Lotrel 5-10 mg daily if elevated at follow-up visit. Hyperlipidemia, agree with increasing Lipitor.  Continue Lipitor 40 mg daily.  Follow-up after cardiac testing     Informed Consent   Shared Decision Making/Informed Consent The risks [chest pain, shortness of breath, cardiac arrhythmias, dizziness, blood pressure fluctuations, myocardial infarction, stroke/transient ischemic attack, nausea, vomiting, allergic reaction, radiation exposure, metallic  taste sensation and life-threatening complications (estimated to be 1 in 10,000)], benefits (risk stratification, diagnosing coronary artery disease, treatment guidance) and alternatives of a nuclear stress test were discussed in detail with Mr. Buchan and he agrees to proceed.      Medication Adjustments/Labs and Tests Ordered: Current medicines are reviewed at length with the patient today.  Concerns regarding medicines are outlined above.  Orders Placed This Encounter  Procedures   NM Myocar Multi W/Spect W/Wall Motion / EF   EKG 12-Lead   ECHOCARDIOGRAM COMPLETE   Meds ordered this encounter  Medications   DISCONTD: atorvastatin (LIPITOR) 40 MG tablet    Sig: Take 1 tablet (40 mg total) by mouth daily.    Dispense:  90 tablet    Refill:  0   aspirin EC 81 MG tablet    Sig: Take 1 tablet (81 mg total) by mouth daily. Swallow whole.   atorvastatin (LIPITOR) 40 MG tablet    Sig: Take 1 tablet (40 mg total) by mouth daily.    Dispense:  90 tablet    Refill:  0    Patient Instructions  Medication Instructions:   Your physician recommends that you continue on your current medications as directed. Please refer to the Current Medication list given to you today.  *If you need a refill on your cardiac medications before your next appointment, please call your pharmacy*   Lab Work:  None Ordered  If you have labs (blood work) drawn today and your tests are completely normal, you will receive your results only by: MyChart Message (if you have MyChart) OR A paper copy in the mail If you have any lab test that is abnormal or we need to change your treatment, we will call you to review the results.   Testing/Procedures:  Your physician has requested that you have an echocardiogram. Echocardiography is a painless test that uses sound waves to create images of your heart. It provides your doctor with information about the size and shape of your heart and how well your heart's chambers  and valves are working. This procedure takes approximately one hour. There are no restrictions for this procedure. Please do  NOT wear cologne, perfume, aftershave, or lotions (deodorant is allowed). Please arrive 15 minutes prior to your appointment time.  Please note: We ask at that you not bring children with you during ultrasound (echo/ vascular) testing. Due to room size and safety concerns, children are not allowed in the ultrasound rooms during exams. Our front office staff cannot provide observation of children in our lobby area while testing is being conducted. An adult accompanying a patient to their appointment will only be allowed in the ultrasound room at the discretion of the ultrasound technician under special circumstances. We apologize for any inconvenience.  [facility] MYOVIEW  Your Provider has ordered a Stress Test with nuclear imaging. The purpose of this test is to evaluate the blood supply to your heart muscle. This procedure is referred to as a "Non-Invasive Stress Test." This is because other than having an IV started in your vein, nothing is inserted or "invades" your body. Cardiac stress tests are done to find areas of poor blood flow to the heart by determining the extent of coronary artery disease (CAD). Some patients exercise on a treadmill, which naturally increases the blood flow to your heart, while others who are unable to walk on a treadmill due to physical limitations have a pharmacologic/chemical stress agent called Lexiscan. This medicine will mimic walking on a treadmill by temporarily increasing your coronary blood flow.     REPORT TO [facility] MEDICAL MALL ENTRANCE  **Proceed to the 1st desk on the right, REGISTRATION, to check in**  Please note: this test may take anywhere between 2-4 hours to complete    Instructions regarding medication:   _XXX__:   You may take all of your regular morning medications the day of your test unless listed below.    How to prepare  for your Myoview test:  Do not eat or drink for 6 hours prior to the test No caffeine for 24 hours prior to the test No smoking 24 hours prior to the test. Ladies, please do not wear dresses.  Skirts or pants are appropriate. Please wear a short sleeve shirt. No perfume, cologne or lotion. Wear comfortable walking shoes. No heels!   PLEASE NOTIFY THE OFFICE AT LEAST 24 HOURS IN ADVANCE IF YOU ARE UNABLE TO KEEP YOUR APPOINTMENT.  [phone] AND  PLEASE NOTIFY NUCLEAR MEDICINE AT [facility] AT LEAST 24 HOURS IN ADVANCE IF YOU ARE UNABLE TO KEEP YOUR APPOINTMENT. [phone]      Follow-Up: At [facility], you and your health needs are our priority.  As part of our continuing mission to provide you with exceptional heart care, we have created designated Provider Care Teams.  These Care Teams include your primary Cardiologist (physician) and Advanced Practice Providers (APPs -  Physician Assistants and Nurse Practitioners) who all work together to provide you with the care you need, when you need it.  We recommend signing up for the patient portal called "MyChart".  Sign up information is provided on this After Visit Summary.  MyChart is used to connect with patients for Virtual Visits (Telemedicine).  Patients are able to view lab/test results, encounter notes, upcoming appointments, etc.  Non-urgent messages can be sent to your provider as well.   To learn more about what you can do with MyChart, go to [email or web address].    Your next appointment:    After Cardiac testing  Provider:   You may see Debbe Odea, MD or one of the following Advanced Practice Providers on your designated Care Team:  Nicolasa Ducking, NP Eula Listen, PA-C Cadence Fransico Michael, PA-C Charlsie Quest, NP Carlos Levering, NP   Signed, Debbe Odea, MD  03/20/2023 9:35 AM    [facility]

## 2023-03-23 ENCOUNTER — Ambulatory Visit
Admission: RE | Admit: 2023-03-23 | Discharge: 2023-03-23 | Disposition: A | Discharge status: Home / Self Care | Payer: 59 | Source: Ambulatory Visit | Attending: Cardiology | Admitting: Cardiology

## 2023-03-23 DIAGNOSIS — I25118 Atherosclerotic heart disease of native coronary artery with other forms of angina pectoris: Secondary | ICD-10-CM

## 2023-03-23 DIAGNOSIS — I2089 Other forms of angina pectoris: Secondary | ICD-10-CM | POA: Insufficient documentation

## 2023-03-23 DIAGNOSIS — I2510 Atherosclerotic heart disease of native coronary artery without angina pectoris: Secondary | ICD-10-CM | POA: Diagnosis present

## 2023-03-23 LAB — NM MYOCAR MULTI W/SPECT W/WALL MOTION / EF
LV dias vol: 73 mL (ref 62–150)
LV sys vol: 20 mL
MPHR: 158 {beats}/min
Nuc Stress EF: 73 %
Peak HR: 103 {beats}/min
Percent HR: 65 %
Rest HR: 77 {beats}/min
Rest Nuclear Isotope Dose: 10.2 mCi
SDS: 2
SRS: 3
SSS: 2
ST Depression (mm): 0 mm
Stress Nuclear Isotope Dose: 29.5 mCi
TID: 0.96

## 2023-03-23 MED ORDER — TECHNETIUM TC 99M TETROFOSMIN IV KIT
10.1700 | PACK | Freq: Once | INTRAVENOUS | Status: AC | PRN
  Administered 2023-03-23: 10.17 via INTRAVENOUS

## 2023-03-23 MED ORDER — TECHNETIUM TC 99M TETROFOSMIN IV KIT
29.5300 | PACK | Freq: Once | INTRAVENOUS | Status: AC | PRN
  Administered 2023-03-23: 29.53 via INTRAVENOUS

## 2023-03-23 MED ORDER — REGADENOSON 0.4 MG/5ML IV SOLN
0.4000 mg | Freq: Once | INTRAVENOUS | Status: AC
  Administered 2023-03-23: 0.4 mg via INTRAVENOUS

## 2023-04-10 ENCOUNTER — Ambulatory Visit: Payer: 59 | Attending: Cardiology

## 2023-04-10 DIAGNOSIS — I25118 Atherosclerotic heart disease of native coronary artery with other forms of angina pectoris: Secondary | ICD-10-CM | POA: Diagnosis not present

## 2023-04-10 DIAGNOSIS — I2089 Other forms of angina pectoris: Secondary | ICD-10-CM

## 2023-04-10 DIAGNOSIS — I2510 Atherosclerotic heart disease of native coronary artery without angina pectoris: Secondary | ICD-10-CM

## 2023-04-10 LAB — ECHOCARDIOGRAM COMPLETE
AR max vel: 1.63 cm2
AV Area VTI: 1.68 cm2
AV Area mean vel: 1.67 cm2
AV Mean grad: 27 mmHg
AV Peak grad: 47.1 mmHg
Ao pk vel: 3.43 m/s
Area-P 1/2: 2.95 cm2
S' Lateral: 3.2 cm

## 2023-04-10 NOTE — Progress Notes (Unsigned)
 Cardiology Office Note:  .   Date:  04/11/2023  ID:  Jeremy [name], DOB Dec 21, 1960, MRN [id number] PCP: Smitty Cords, DO  [facility] Providers Cardiologist:  Debbe Odea, MD    History of Present Illness: .   Jeremy [name] is a 63 y.o. male with past medical history of coronary artery disease with calcium score 1245 (03/2023), hypertension, hyperlipidemia, former smoker x 20 years, GERD, prediabetes, obesity, who is here today for follow-up.  He was previously seen in clinic on 03/20/2023 by Dr.Agbor-Etang for the evaluation of elevated coronary artery calcium score.  Calcium score on 03/16/2023 showed a value of 1245 mL with LAD, left circumflex, RCA, be in the 96 percentile.  He had previously been on atorvastatin and it was increased to 40 mg daily and he was started on aspirin 81 mg daily.  He was scheduled for a [employer] as well as an echocardiogram.  He returns to clinic today stating that he has been doing well. He denies any shortness of breath, DOE, chest pain, palpitations, or peripheral edema. He has been compliant with his current medication regimen.  Recently had undergone stress testing and had echocardiogram done.  States that family history of coronary artery disease and issues with cholesterol.  States that he has been on atorvastatin 40 mg since about the end of January.  Had previously been advised by his primary care provider that he had a heart murmur.  Denies any hospitalizations or visits to the emergency department.  ROS: 10 point review of systems is reviewed and considered negative except ones been listed in HPI  Studies Reviewed: .       2D echo 04/10/2023 1. Left ventricular ejection fraction, by estimation, is 55 to 60%. Left  ventricular ejection fraction by 3D volume is 55 %. The left ventricle has  normal function. The left ventricle has no regional wall motion  abnormalities. Left ventricular diastolic   parameters are  consistent with Grade I diastolic dysfunction (impaired  relaxation). The average left ventricular global longitudinal strain is  -19.5 %. The global longitudinal strain is normal.   2. Right ventricular systolic function is normal. The right ventricular  size is normal. There is normal pulmonary artery systolic pressure. The  estimated right ventricular systolic pressure is 24.4 mmHg.   3. The mitral valve is normal in structure. No evidence of mitral valve  regurgitation. No evidence of mitral stenosis.   4. The aortic valve has an indeterminant number of cusps. There is mild  calcification of the aortic valve. Aortic valve regurgitation is mild.  Moderate aortic valve stenosis. Aortic valve area, by VTI measures 1.68  cm. Aortic valve mean gradient  measures 27.0 mmHg. Aortic valve Vmax measures 3.43 m/s.   5. The inferior vena cava is normal in size with greater than 50%  respiratory variability, suggesting right atrial pressure of 3 mmHg.   Lexiscan MPI 03/23/2023   Normal pharmacologic myocardial perfusion stress test without evidence of significant ischemia or scar.   Left ventricular systolic function is normal (LVEF greater than 65%).   Coronary artery calcification and aortic atherosclerosis are noted on the attenuation correction CT.   This is a low risk study   cCTA 03/16/2023 IMPRESSION AND RECOMMENDATION: 1. Coronary calcium score of 1245. This was 96th percentile for age and sex matched control.   2. CAC >300 in LAD, LCx, RCA. CAC-DRS A3/N3.   3. Recommend aspirin and statin if no contraindication.   4.  Recommend cardiology consultation.   5. Continue heart healthy lifestyle and risk factor modification. Risk Assessment/Calculations:        Physical Exam:   VS:  BP (!) 140/70 (BP Location: Left Arm, Patient Position: Sitting, Cuff Size: Normal)   Pulse 83   Ht 6' (1.829 m)   Wt 220 lb (99.8 kg)   SpO2 95%   BMI 29.84 kg/m    Wt Readings from Last 3  Encounters:  04/11/23 220 lb (99.8 kg)  03/20/23 226 lb (102.5 kg)  03/15/23 220 lb (99.8 kg)    GEN: Well nourished, well developed in no acute distress NECK: No JVD; No carotid bruits, heart murmur radiates into the right carotid CARDIAC: RRR, II/VI systolic murmurs R/LUSB without rubs or gallops RESPIRATORY:  Clear to auscultation without rales, wheezing or rhonchi  ABDOMEN: Soft, non-tender, non-distended EXTREMITIES:  No edema; No deformity   ASSESSMENT AND PLAN: .   Coronary artery disease with a coronary calcium score of 1245, LAD, RCA, left circumflex calcifications.  Lexiscan Myoview completed was considered a low risk scan.  He is continued on aspirin 81 mg daily and atorvastatin 40 mg daily.  Will need updated lipid panel on return.  Denies any angina or anginal equivalents at this time.  Continue to work on lifestyle modification and progression of current disease.   Hypertension with a blood pressure today of 140/70.  Blood pressure slightly elevated above goal.  His Lotrel 5-10 mg daily is been increased to 10/20 mg daily.  He will double up on the current prescription he has at home.  Does not require a refill today.  Encouraged to monitor pressures 1 to 2 hours postmedication administration at home as well.  Heart murmur with moderate aortic stenosis noted on echocardiogram.  Echocardiogram revealed an LVEF of 55 to 60%, no RWMA, G1 DD, RV SF normal, mild calcification of the aortic valve, mild aortic regurgitation, moderate aortic valve stenosis with aortic valve area by VTI measuring 1.68 cm, aortic valve mean gradient measuring 27 mmHg, and aortic valve V-max measures 3.43 m/s.  Will need to be managed with close follow-up as he remains asymptomatic.  Further diagnostic testing will likely be needed at a later date.  Continued on statin therapy.  Hyperlipidemia with an LDL of 107 with a goal of less than 55.  This was discussed with him as well today.  Longstanding family  history of issues with cholesterol.  He has been taking atorvastatin 40 mg daily without any adverse side effects.  Will need to have an updated lipid and hepatic panel scheduled and return.       Dispo: Patient return to clinic to see MD/APP in 3 weeks or sooner for reevaluation of symptoms and blood pressure after medication changes were made today.  Signed, Greely Atiyeh, NP

## 2023-04-11 ENCOUNTER — Ambulatory Visit: Payer: 59 | Admitting: Cardiology

## 2023-04-11 ENCOUNTER — Encounter: Payer: Self-pay | Admitting: Cardiology

## 2023-04-11 VITALS — BP 140/70 | HR 83 | Ht 72.0 in | Wt 220.0 lb

## 2023-04-11 DIAGNOSIS — E782 Mixed hyperlipidemia: Secondary | ICD-10-CM

## 2023-04-11 DIAGNOSIS — I2510 Atherosclerotic heart disease of native coronary artery without angina pectoris: Secondary | ICD-10-CM | POA: Diagnosis not present

## 2023-04-11 DIAGNOSIS — I10 Essential (primary) hypertension: Secondary | ICD-10-CM | POA: Diagnosis not present

## 2023-04-11 DIAGNOSIS — R011 Cardiac murmur, unspecified: Secondary | ICD-10-CM

## 2023-04-11 DIAGNOSIS — I350 Nonrheumatic aortic (valve) stenosis: Secondary | ICD-10-CM

## 2023-04-11 NOTE — Patient Instructions (Signed)
 Medication Instructions:   *If you need a refill on your cardiac medications before your next appointment, please call your pharmacy*  Your physician recommends the following medication changes.   INCREASE:  Amlodipine -Benazepril (Lotrel ) 10/20 MG Capsule   Lab Work:  No labs ordered today  If you have labs (blood work) drawn today and your tests are completely normal, you will receive your results only by: MyChart Message (if you have MyChart) OR A paper copy in the mail If you have any lab test that is abnormal or we need to change your treatment, we will call you to review the results.   Testing/Procedures: No test ordered today    Follow-Up: At [facility], you and your health needs are our priority.  As part of our continuing mission to provide you with exceptional heart care, we have created designated Provider Care Teams.  These Care Teams include your primary Cardiologist (physician) and Advanced Practice Providers (APPs -  Physician Assistants and Nurse Practitioners) who all work together to provide you with the care you need, when you need it.  Your next appointment:   3 week(s)  Provider:   You will see one of the following Advanced Practice Providers on your designated Care Team:   Nicolasa Ducking, NP Eula Listen, PA-C Cadence Fransico Michael, PA-C Charlsie Quest, NP Carlos Levering, NP

## 2023-04-13 ENCOUNTER — Encounter: Payer: Self-pay | Admitting: Emergency Medicine

## 2023-05-10 ENCOUNTER — Ambulatory Visit: Admitting: Cardiology

## 2023-05-11 ENCOUNTER — Ambulatory Visit: Admitting: Cardiology

## 2023-05-21 ENCOUNTER — Encounter: Payer: Self-pay | Admitting: Cardiology

## 2023-05-21 ENCOUNTER — Ambulatory Visit: Attending: Cardiology | Admitting: Cardiology

## 2023-05-21 VITALS — BP 130/72 | HR 81 | Ht 72.0 in | Wt 225.2 lb

## 2023-05-21 DIAGNOSIS — E782 Mixed hyperlipidemia: Secondary | ICD-10-CM

## 2023-05-21 DIAGNOSIS — I350 Nonrheumatic aortic (valve) stenosis: Secondary | ICD-10-CM

## 2023-05-21 DIAGNOSIS — I10 Essential (primary) hypertension: Secondary | ICD-10-CM | POA: Diagnosis not present

## 2023-05-21 DIAGNOSIS — I2510 Atherosclerotic heart disease of native coronary artery without angina pectoris: Secondary | ICD-10-CM | POA: Diagnosis not present

## 2023-05-21 MED ORDER — AMLODIPINE BESY-BENAZEPRIL HCL 5-10 MG PO CAPS: 1.0000 | ORAL_CAPSULE | Freq: Two times a day (BID) | ORAL | 3 refills | Status: DC

## 2023-05-21 NOTE — Patient Instructions (Addendum)
 Medication Instructions:  INCREASE YOUR LOTREL 5-10 MG TO 1 TABLET TWICE DAILY.   Lab Work: [employer] TODAY   Testing/Procedures: NONE  Follow-Up: At [facility], you and your health needs are our priority.  As part of our continuing mission to provide you with exceptional heart care, our providers are all part of one team.  This team includes your primary Cardiologist (physician) and Advanced Practice Providers or APPs (Physician Assistants and Nurse Practitioners) who all work together to provide you with the care you need, when you need it.  Your next appointment:   6 month(s)  Provider:   Ronald Cockayne, NP

## 2023-05-21 NOTE — Progress Notes (Signed)
 Cardiology Office Note:  .   Date:  05/21/2023  ID:  Jeremy [name], DOB 01-18-1961, MRN [id number] PCP: Jeremy Cords, DO  [facility] Providers Cardiologist:  Debbe Odea, MD    History of Present Illness: .   Jeremy [name] is a 63 y.o. male with a past medical history of coronary artery disease with a calcium score 1245 (03/2023), aortic stenosis, hypertension, hyperlipidemia, former smoker x 20+ years, GERD, prediabetes, obesity, is here today for follow-up.   He was previously seen in clinic on 03/20/2023 by Dr.Agbor-Etang for the evaluation of elevated coronary artery calcium score.  Calcium score on 03/16/2023 showed a value of 1245 mL with LAD, left circumflex, RCA, be in the 96 percentile.  He had previously been on atorvastatin and it was increased to 40 mg daily and he was started on aspirin 81 mg daily.  He was scheduled for a Lexiscan Myoview as well as an echocardiogram.    Last seen in clinic 04/11/2023.  He stated been doing well.  Denies any shortness of breath, DOE, chest pain palpitation or peripheral edema.  Had been on atorvastatin since the end of January.  His Lotrel was increased due to elevated pressures.  No further testing was ordered at that time.  He returns to clinic today  ROS: 10 point review of system has been reviewed and considered negative except ones are listed in the HPI  Studies Reviewed: .        2D echo 04/10/2023 1. Left ventricular ejection fraction, by estimation, is 55 to 60%. Left  ventricular ejection fraction by 3D volume is 55 %. The left ventricle has  normal function. The left ventricle has no regional wall motion  abnormalities. Left ventricular diastolic   parameters are consistent with Grade I diastolic dysfunction (impaired  relaxation). The average left ventricular global longitudinal strain is  -19.5 %. The global longitudinal strain is normal.   2. Right ventricular systolic function is normal. The right  ventricular  size is normal. There is normal pulmonary artery systolic pressure. The  estimated right ventricular systolic pressure is 24.4 mmHg.   3. The mitral valve is normal in structure. No evidence of mitral valve  regurgitation. No evidence of mitral stenosis.   4. The aortic valve has an indeterminant number of cusps. There is mild  calcification of the aortic valve. Aortic valve regurgitation is mild.  Moderate aortic valve stenosis. Aortic valve area, by VTI measures 1.68  cm. Aortic valve mean gradient  measures 27.0 mmHg. Aortic valve Vmax measures 3.43 m/s.   5. The inferior vena cava is normal in size with greater than 50%  respiratory variability, suggesting right atrial pressure of 3 mmHg.    Lexiscan MPI 03/23/2023   Normal pharmacologic myocardial perfusion stress test without evidence of significant ischemia or scar.   Left ventricular systolic function is normal (LVEF greater than 65%).   Coronary artery calcification and aortic atherosclerosis are noted on the attenuation correction CT.   This is a low risk study   cCTA 03/16/2023 IMPRESSION AND RECOMMENDATION: 1. Coronary calcium score of 1245. This was 96th percentile for age and sex matched control.   2. CAC >300 in LAD, LCx, RCA. CAC-DRS A3/N3.   3. Recommend aspirin and statin if no contraindication.   4. Recommend cardiology consultation.   5. Continue heart healthy lifestyle and risk factor modification. Risk Assessment/Calculations:             Physical Exam:  VS:  BP 130/72   Pulse 81   Ht 6' (1.829 m)   Wt 225 lb 3.2 oz (102.2 kg)   SpO2 96%   BMI 30.54 kg/m    Wt Readings from Last 3 Encounters:  05/21/23 225 lb 3.2 oz (102.2 kg)  04/11/23 220 lb (99.8 kg)  03/20/23 226 lb (102.5 kg)    GEN: Well nourished, well developed in no acute distress NECK: No JVD; No carotid bruits, heart murmur radiates into bilateral carotids CARDIAC: RRR, II/VI systolic murmur without rubs or  gallops RESPIRATORY:  Clear to auscultation without rales, wheezing or rhonchi  ABDOMEN: Soft, non-tender, non-distended EXTREMITIES:  No edema; No deformity   ASSESSMENT AND PLAN: .   Coronary artery disease with coronary calcium score 1245, LAD, RCA, left circumflex calcification.  Lexiscan Myoview completed consider Lexiscan.  Continue aspirin 81 mg daily and atorvastatin 40 mg daily.  He has an updated labs scheduled with PCP for lipids.  Continues to deny any anginal or anginal equivalents.  Continue to work on lifestyle modification to prevent progression of current disease.  No further ischemic workup needed at this time.  Primary hypertension with blood pressure 130/72.  Her blood pressure has been better managed with increase in his Lotrel to 5/10 mg twice daily.  New prescription sent to [facility] per patient's request.  He is encouraged to continue to monitor his pressures 1 to 2 hours postmedication administration at home as well.  Being sent for an updated BMP today to reevaluate kidney function and electrolytes with medication change.  Moderate aortic stenosis with noted on echocardiogram with an LVEF of 55 to 60%, no RWMA, G1 DD, RV SF normal, mild calcification of the aortic valve with mild aortic regurgitation and moderate aortic valve stenosis with aortic valve area by VTI measuring 1.68 cm, aortic valve mean gradient measuring 27 mmHg, aortic valve V-max measures 3.51m/s.  Continue with surveillance studies yearly, with last study being 04/10/2023.  Mixed hyperlipidemia with an LDL of 107 with a goal of less than 55.  He is continued on atorvastatin 40 mg daily with updated panel scheduled.       Dispo: Patient to return to the clinic to see MD/APP in 6 months or sooner if needed for evaluation of symptoms  Signed, Katleen Carraway, NP

## 2023-05-22 LAB — BASIC METABOLIC PANEL WITH GFR
BUN/Creatinine Ratio: 17 (ref 10–24)
BUN: 12 mg/dL (ref 8–27)
CO2: 22 mmol/L (ref 20–29)
Calcium: 9.5 mg/dL (ref 8.6–10.2)
Chloride: 104 mmol/L (ref 96–106)
Creatinine, Ser: 0.72 mg/dL — ABNORMAL LOW (ref 0.76–1.27)
Glucose: 130 mg/dL — ABNORMAL HIGH (ref 70–99)
Potassium: 5.1 mmol/L (ref 3.5–5.2)
Sodium: 141 mmol/L (ref 134–144)
eGFR: 103 mL/min/{1.73_m2} (ref >59)

## 2023-05-22 NOTE — Progress Notes (Signed)
 Kidney function remains stable.  No changes needed to current medication regimen at this time.

## 2023-09-04 ENCOUNTER — Ambulatory Visit
Admission: RE | Admit: 2023-09-04 | Discharge: 2023-09-04 | Disposition: A | Discharge status: Home / Self Care | Source: Ambulatory Visit | Attending: Acute Care | Admitting: Acute Care

## 2023-09-04 DIAGNOSIS — Z87891 Personal history of nicotine dependence: Secondary | ICD-10-CM | POA: Diagnosis present

## 2023-09-04 DIAGNOSIS — Z122 Encounter for screening for malignant neoplasm of respiratory organs: Secondary | ICD-10-CM | POA: Insufficient documentation

## 2023-09-06 ENCOUNTER — Encounter: Payer: Self-pay | Admitting: Family Medicine

## 2023-09-10 ENCOUNTER — Other Ambulatory Visit: Payer: Self-pay

## 2023-09-10 DIAGNOSIS — Z87891 Personal history of nicotine dependence: Secondary | ICD-10-CM

## 2023-09-10 DIAGNOSIS — Z122 Encounter for screening for malignant neoplasm of respiratory organs: Secondary | ICD-10-CM

## 2023-09-12 ENCOUNTER — Encounter: Payer: Self-pay | Admitting: Family Medicine

## 2023-09-13 ENCOUNTER — Ambulatory Visit (INDEPENDENT_AMBULATORY_CARE_PROVIDER_SITE_OTHER): Payer: Self-pay | Admitting: Family Medicine

## 2023-09-13 ENCOUNTER — Other Ambulatory Visit: Payer: Self-pay | Admitting: Family Medicine

## 2023-09-13 ENCOUNTER — Encounter: Payer: Self-pay | Admitting: Family Medicine

## 2023-09-13 VITALS — BP 124/80 | HR 67 | Ht 72.0 in | Wt 209.5 lb

## 2023-09-13 DIAGNOSIS — I10 Essential (primary) hypertension: Secondary | ICD-10-CM

## 2023-09-13 DIAGNOSIS — Z0000 Encounter for general adult medical examination without abnormal findings: Secondary | ICD-10-CM

## 2023-09-13 DIAGNOSIS — E663 Overweight: Secondary | ICD-10-CM | POA: Diagnosis not present

## 2023-09-13 DIAGNOSIS — E782 Mixed hyperlipidemia: Secondary | ICD-10-CM | POA: Diagnosis not present

## 2023-09-13 DIAGNOSIS — R7303 Prediabetes: Secondary | ICD-10-CM | POA: Diagnosis not present

## 2023-09-13 DIAGNOSIS — Z125 Encounter for screening for malignant neoplasm of prostate: Secondary | ICD-10-CM

## 2023-09-13 LAB — POCT GLYCOSYLATED HEMOGLOBIN (HGB A1C): Hemoglobin A1C: 5.8 % — AB (ref 4.0–5.6)

## 2023-09-13 LAB — COMPREHENSIVE METABOLIC PANEL WITH GFR
AG Ratio: 2.1 (calc) (ref 1.0–2.5)
ALT: 42 U/L (ref 9–46)
AST: 40 U/L — ABNORMAL HIGH (ref 10–35)
Albumin: 4.8 g/dL (ref 3.6–5.1)
Alkaline phosphatase (APISO): 83 U/L (ref 35–144)
BUN/Creatinine Ratio: 16 (calc) (ref 6–22)
BUN: 10 mg/dL (ref 7–25)
CO2: 28 mmol/L (ref 20–32)
Calcium: 10 mg/dL (ref 8.6–10.3)
Chloride: 104 mmol/L (ref 98–110)
Creat: 0.64 mg/dL — ABNORMAL LOW (ref 0.70–1.35)
Globulin: 2.3 g/dL (ref 1.9–3.7)
Glucose, Bld: 131 mg/dL — ABNORMAL HIGH (ref 65–99)
Potassium: 4.7 mmol/L (ref 3.5–5.3)
Sodium: 140 mmol/L (ref 135–146)
Total Bilirubin: 0.7 mg/dL (ref 0.2–1.2)
Total Protein: 7.1 g/dL (ref 6.1–8.1)
eGFR: 106 mL/min/1.73m2 (ref >=60)

## 2023-09-13 LAB — LIPID PANEL
Cholesterol: 154 mg/dL (ref <200)
HDL: 61 mg/dL (ref >=40)
LDL Cholesterol (Calc): 72 mg/dL
Non-HDL Cholesterol (Calc): 93 mg/dL (ref <130)
Total CHOL/HDL Ratio: 2.5 (calc) (ref <5.0)
Triglycerides: 124 mg/dL (ref <150)

## 2023-09-13 NOTE — Patient Instructions (Addendum)
 Thank you for coming to the office today.  Recent Labs    03/08/23 0758 09/13/23 0856  HGBA1C 6.9* 5.8*   Excellent result. Keep up the great work at this point now that we are in lower Pre-Diabetic range.  In the future if you keep up the great work we can discontinue the Metformin   BP is well controlled  Cholesterol panel and liver enzymes pending, we will share results on mychart.  DUE for FASTING BLOOD WORK (no food or drink after midnight before the lab appointment, only water  or coffee without cream/sugar on the morning of)  SCHEDULE Lab Only visit in the morning at the clinic for lab draw in 6 MONTHS   - Make sure Lab Only appointment is at about 1 week before your next appointment, so that results will be available  For Lab Results, once available within 2-3 days of blood draw, you can can log in to MyChart online to view your results and a brief explanation. Also, we can discuss results at next follow-up visit.   Please schedule a Follow-up Appointment to: Return for 6 month fasting lab > 1 week later Annual Physical.  If you have any other questions or concerns, please feel free to call the office or send a message through MyChart. You may also schedule an earlier appointment if necessary.  Additionally, you may be receiving a survey about your experience at our office within a few days to 1 week by e-mail or mail. We value your feedback.  Marsa Officer, DO [facility], [place]

## 2023-09-13 NOTE — Progress Notes (Signed)
 Subjective:    Patient ID: Jeremy [name], male    DOB: [birth year], 63 y.o.   MRN: [id number]  Jeremy [name] is a 63 y.o. male presenting on 09/13/2023 for Medical Management of Chronic Issues, Pre-Diabetes, and Hypertension   HPI  Discussed the use of AI scribe software for clinical note transcription with the patient, who gave verbal consent to proceed.  History of Present Illness   Jeremy [name] is a 63 year old male with hyperlipidemia and prediabetes who presents for follow-up on his cholesterol and blood sugar levels.  Weight loss and lifestyle modification - Weight decreased from 225 lbs to 209 lbs over the past four months - Increased physical activity during the summer - Alcohol consumption reduced to a couple of beers per week  HYPERLIPIDEMIA: - Reports no concerns. Last lipid panel 02/2023, mild elevated LDL 107 Cardiology dose increased his Statin from 20 to 40mg  in the past 2 months - Currently taking Atorvastatin  40mg  daily in PM, tolerating well without side effects or myalgias - Asking for repeat lipid panel  Prediabetes Prior history of A1c 6.9, 6 months ago, previously only in PreDM range - Managing sugar intake by avoiding sugary drinks and limiting ice cream to once a month - Major overhaul on diet and has had weight loss - Currently taking metformin  low dose 500mg  daily, with supply sufficient until December       Health Maintenance:  Future Prevnar-20 next physical Feb 2026     09/13/2023    8:35 AM 06/13/2022    2:42 PM 11/01/2021    8:19 AM  Depression screen PHQ 2/9  Decreased Interest 0 0 0  Down, Depressed, Hopeless 0 0 0  PHQ - 2 Score 0 0 0  Altered sleeping   0  Tired, decreased energy   0  Change in appetite   0  Feeling bad or failure about yourself    0  Trouble concentrating   0  Moving slowly or fidgety/restless   0  Suicidal thoughts   0  PHQ-9 Score   0  Difficult doing work/chores   Not difficult at all        09/13/2023    8:35 AM 06/13/2022    2:42 PM 11/01/2021    8:19 AM 12/12/2018   11:31 AM  GAD 7 : Generalized Anxiety Score  Nervous, Anxious, on Edge 0 0 0 0  Control/stop worrying 0 0 0 0  Worry too much - different things 0 0 0 0  Trouble relaxing 0 0 0 0  Restless 0 0 0 0  Easily annoyed or irritable 0 0 0 0  Afraid - awful might happen 0 0 0 0  Total GAD 7 Score 0 0 0 0  Anxiety Difficulty   Not difficult at all     Social History   Tobacco Use   Smoking status: Former    Current packs/day: 0.00    Average packs/day: 1 pack/day for 35.0 years (35.0 ttl pk-yrs)    Types: Cigarettes    Start date: 01/07/1979    Quit date: 01/06/2014    Years since quitting: 9.6   Smokeless tobacco: Former  Building services engineer status: Never Used  Substance Use Topics   Alcohol use: Yes    Alcohol/week: 6.0 standard drinks of alcohol    Types: 6 Cans of beer per week   Drug use: No    Review of Systems Per HPI unless specifically indicated  above     Objective:    BP 124/80 (BP Location: Right Arm, Patient Position: Sitting, Cuff Size: Normal)   Pulse 67   Ht 6' (1.829 m)   Wt 209 lb 8 oz (95 kg)   SpO2 98%   BMI 28.41 kg/m   Wt Readings from Last 3 Encounters:  09/13/23 209 lb 8 oz (95 kg)  09/04/23 218 lb (98.9 kg)  05/21/23 225 lb 3.2 oz (102.2 kg)    Physical Exam Vitals and nursing note reviewed.  Constitutional:      General: He is not in acute distress.    Appearance: Normal appearance. He is well-developed. He is not diaphoretic.     Comments: Well-appearing, comfortable, cooperative  HENT:     Head: Normocephalic and atraumatic.  Eyes:     General:        Right eye: No discharge.        Left eye: No discharge.     Conjunctiva/sclera: Conjunctivae normal.  Cardiovascular:     Rate and Rhythm: Normal rate.  Pulmonary:     Effort: Pulmonary effort is normal.  Skin:    General: Skin is warm and dry.     Findings: No erythema or rash.  Neurological:     Mental  Status: He is alert and oriented to person, place, and time.  Psychiatric:        Mood and Affect: Mood normal.        Behavior: Behavior normal.        Thought Content: Thought content normal.     Comments: Well groomed, good eye contact, normal speech and thoughts     Recent Labs    03/08/23 0758 09/13/23 0856  HGBA1C 6.9* 5.8*     Results for orders placed or performed in visit on 09/13/23  POCT glycosylated hemoglobin (Hb A1C)   Collection Time: 09/13/23  8:56 AM  Result Value Ref Range   Hemoglobin A1C 5.8 (A) 4.0 - 5.6 %   HbA1c POC (<> result, manual entry)     HbA1c, POC (prediabetic range)     HbA1c, POC (controlled diabetic range)        Assessment & Plan:   Problem List Items Addressed This Visit     Essential (primary) hypertension   Relevant Orders   Comprehensive metabolic panel with GFR   Hyperlipidemia   Relevant Orders   Lipid panel   Comprehensive metabolic panel with GFR   Overweight (BMI 25.0-29.9)   Prediabetes - Primary   Relevant Orders   POCT glycosylated hemoglobin (Hb A1C) (Completed)     Prediabetes Prediabetes with lifestyle improvements and weight loss. A1c reduction efforts ongoing to prevent diabetes. - Order A1c test. POC today - A1c resulted today at 5.8, previous result was >6.5 last time 6 months ago. Signiicant improvement with lifestyle modifications  - Continue lifestyle modifications to reduce sugar intake. - Continue Metformin  500mg  daily, future can adjust it or discontinue if no longer needed  Overweight BMI >28 Obesity with recent weight loss due to increased physical activity. - Continue increased physical activity and lifestyle modifications.  Hyperlipidemia Hyperlipidemia managed with atorvastatin , monitoring required due to mild liver enzyme elevation. - Order cholesterol and liver enzyme tests. - Continue atorvastatin  40 mg in the evening. - Monitor for atorvastatin  refills and coordinate with cardiologist if  needed. - Note followed by Cardiology has had elevated coronary calcium  score and dose adjust on his Statin  Hypertension Hypertension well-controlled with current medication regimen.  General Health Maintenance Discussed importance of flu and pneumonia vaccines, shingles vaccine received. - Receive flu vaccine during the upcoming season. - Plan for pneumonia vaccine at next yearly physical in February.        Orders Placed This Encounter  Procedures   Lipid panel    Has the patient fasted?:   Yes   Comprehensive metabolic panel with GFR    Has the patient fasted?:   Yes   POCT glycosylated hemoglobin (Hb A1C)    No orders of the defined types were placed in this encounter.   Follow up plan: Return for 6 month fasting lab > 1 week later Annual Physical.  Future labs ordered for 03/2024    Marsa Officer, DO [facility] 09/13/2023, 8:40 AM

## 2023-09-15 ENCOUNTER — Ambulatory Visit: Payer: Self-pay | Admitting: Family Medicine

## 2023-10-06 ENCOUNTER — Other Ambulatory Visit: Payer: Self-pay | Admitting: Cardiology

## 2023-10-06 DIAGNOSIS — E785 Hyperlipidemia, unspecified: Secondary | ICD-10-CM

## 2023-10-07 ENCOUNTER — Encounter: Payer: Self-pay | Admitting: Family Medicine

## 2023-10-23 ENCOUNTER — Encounter: Payer: Self-pay | Admitting: Family Medicine

## 2023-11-20 ENCOUNTER — Other Ambulatory Visit: Payer: Self-pay | Admitting: Family Medicine

## 2023-11-20 DIAGNOSIS — R7303 Prediabetes: Secondary | ICD-10-CM

## 2023-11-22 NOTE — Telephone Encounter (Signed)
 Too soon for refill, LRF 01/24/23 for 90 and 3 RF.  Requested Prescriptions  Pending Prescriptions Disp Refills   metFORMIN  (GLUCOPHAGE ) 500 MG tablet [Pharmacy Med Name: metFORMIN  HCl 500 MG Oral Tablet] 90 tablet 3    Sig: TAKE 1 TABLET BY MOUTH DAILY  WITH BREAKFAST     Endocrinology:  Diabetes - Biguanides Failed - 11/22/2023  3:21 PM      Failed - Cr in normal range and within 360 days    Creat  Date Value Ref Range Status  09/13/2023 0.64 (L) 0.70 - 1.35 mg/dL Final         Failed - B12 Level in normal range and within 720 days    No results found for: VITAMINB12       Passed - HBA1C is between 0 and 7.9 and within 180 days    Hemoglobin A1C  Date Value Ref Range Status  09/13/2023 5.8 (A) 4.0 - 5.6 % Final   Hgb A1c MFr Bld  Date Value Ref Range Status  03/08/2023 6.9 (H) <5.7 % of total Hgb Final    Comment:    For someone without known diabetes, a hemoglobin A1c value of 6.5% or greater indicates that they may have  diabetes and this should be confirmed with a follow-up  test. . For someone with known diabetes, a value <7% indicates  that their diabetes is well controlled and a value  greater than or equal to 7% indicates suboptimal  control. A1c targets should be individualized based on  duration of diabetes, age, comorbid conditions, and  other considerations. . Currently, no consensus exists regarding use of hemoglobin A1c for diagnosis of diabetes for children. .          Passed - eGFR in normal range and within 360 days    GFR, Est African American  Date Value Ref Range Status  06/06/2019 122 > OR = 60 mL/min/1.22m2 Final   GFR, Est Non African American  Date Value Ref Range Status  06/06/2019 105 > OR = 60 mL/min/1.74m2 Final   GFR, Estimated  Date Value Ref Range Status  03/08/2021 >60 >60 mL/min Final    Comment:    (NOTE) Calculated using the CKD-EPI Creatinine Equation (2021)    eGFR  Date Value Ref Range Status  09/13/2023 106 > OR  = 60 mL/min/1.42m2 Final  05/21/2023 103 >59 mL/min/1.73 Final         Passed - Valid encounter within last 6 months    Recent Outpatient Visits           2 months ago Prediabetes   [place] [facility] [place], Marsa PARAS, DO   8 months ago Annual physical exam   [facility] [place], Marsa PARAS, DO              Passed - CBC within normal limits and completed in the last 12 months    WBC  Date Value Ref Range Status  03/08/2023 7.7 3.8 - 10.8 Thousand/uL Final   RBC  Date Value Ref Range Status  03/08/2023 5.01 4.20 - 5.80 Million/uL Final   Hemoglobin  Date Value Ref Range Status  03/08/2023 16.0 13.2 - 17.1 g/dL Final  87/98/7983 83.2 12.6 - 17.7 g/dL Final   HCT  Date Value Ref Range Status  03/08/2023 48.2 38.5 - 50.0 % Final   Hematocrit  Date Value Ref Range Status  01/07/2015 47.4 37.5 - 51.0 % Final  MCHC  Date Value Ref Range Status  03/08/2023 33.2 32.0 - 36.0 g/dL Final    Comment:    For adults, a slight decrease in the calculated MCHC value (in the range of 30 to 32 g/dL) is most likely not clinically significant; however, it should be interpreted with caution in correlation with other red cell parameters and the patient's clinical condition.    [facility]  Date Value Ref Range Status  03/08/2023 31.9 27.0 - 33.0 pg Final   MCV  Date Value Ref Range Status  03/08/2023 96.2 80.0 - 100.0 fL Final  01/07/2015 93 79 - 97 fL Final   No results found for: PLTCOUNTKUC, LABPLAT, POCPLA RDW  Date Value Ref Range Status  03/08/2023 12.0 11.0 - 15.0 % Final  01/07/2015 12.9 12.3 - 15.4 % Final

## 2023-11-29 ENCOUNTER — Other Ambulatory Visit: Payer: Self-pay | Admitting: Family Medicine

## 2023-11-29 DIAGNOSIS — R7303 Prediabetes: Secondary | ICD-10-CM

## 2023-11-30 NOTE — Telephone Encounter (Signed)
 Requested Prescriptions  Pending Prescriptions Disp Refills   metFORMIN  (GLUCOPHAGE ) 500 MG tablet [Pharmacy Med Name: metFORMIN  HCl 500 MG Oral Tablet] 90 tablet 1    Sig: TAKE 1 TABLET BY MOUTH DAILY  WITH BREAKFAST     Endocrinology:  Diabetes - Biguanides Failed - 11/30/2023  4:39 PM      Failed - Cr in normal range and within 360 days    Creat  Date Value Ref Range Status  09/13/2023 0.64 (L) 0.70 - 1.35 mg/dL Final         Failed - B12 Level in normal range and within 720 days    No results found for: VITAMINB12       Passed - HBA1C is between 0 and 7.9 and within 180 days    Hemoglobin A1C  Date Value Ref Range Status  09/13/2023 5.8 (A) 4.0 - 5.6 % Final   Hgb A1c MFr Bld  Date Value Ref Range Status  03/08/2023 6.9 (H) <5.7 % of total Hgb Final    Comment:    For someone without known diabetes, a hemoglobin A1c value of 6.5% or greater indicates that they may have  diabetes and this should be confirmed with a follow-up  test. . For someone with known diabetes, a value <7% indicates  that their diabetes is well controlled and a value  greater than or equal to 7% indicates suboptimal  control. A1c targets should be individualized based on  duration of diabetes, age, comorbid conditions, and  other considerations. . Currently, no consensus exists regarding use of hemoglobin A1c for diagnosis of diabetes for children. .          Passed - eGFR in normal range and within 360 days    GFR, Est African American  Date Value Ref Range Status  06/06/2019 122 > OR = 60 mL/min/1.21m2 Final   GFR, Est Non African American  Date Value Ref Range Status  06/06/2019 105 > OR = 60 mL/min/1.12m2 Final   GFR, Estimated  Date Value Ref Range Status  03/08/2021 >60 >60 mL/min Final    Comment:    (NOTE) Calculated using the CKD-EPI Creatinine Equation (2021)    eGFR  Date Value Ref Range Status  09/13/2023 106 > OR = 60 mL/min/1.51m2 Final  05/21/2023 103 >59  mL/min/1.73 Final         Passed - Valid encounter within last 6 months    Recent Outpatient Visits           2 months ago Prediabetes   [facility] [place], Marsa PARAS, DO   8 months ago Annual physical exam   [place] [facility] [place], Marsa PARAS, DO       Future Appointments             In 3 days Raymund, Lauraine BROCKS, MD [facility]   In 4 days Gerard Frederick, NP [facility] - CBC within normal limits and completed in the last 12 months    WBC  Date Value Ref Range Status  03/08/2023 7.7 3.8 - 10.8 Thousand/uL Final   RBC  Date Value Ref Range Status  03/08/2023 5.01 4.20 - 5.80 Million/uL Final   Hemoglobin  Date Value Ref Range Status  03/08/2023 16.0 13.2 - 17.1 g/dL Final  87/98/7983 83.2 12.6 - 17.7 g/dL Final   HCT  Date  Value Ref Range Status  03/08/2023 48.2 38.5 - 50.0 % Final   Hematocrit  Date Value Ref Range Status  01/07/2015 47.4 37.5 - 51.0 % Final   MCHC  Date Value Ref Range Status  03/08/2023 33.2 32.0 - 36.0 g/dL Final    Comment:    For adults, a slight decrease in the calculated MCHC value (in the range of 30 to 32 g/dL) is most likely not clinically significant; however, it should be interpreted with caution in correlation with other red cell parameters and the patient's clinical condition.    [facility]  Date Value Ref Range Status  03/08/2023 31.9 27.0 - 33.0 pg Final   MCV  Date Value Ref Range Status  03/08/2023 96.2 80.0 - 100.0 fL Final  01/07/2015 93 79 - 97 fL Final   No results found for: PLTCOUNTKUC, LABPLAT, POCPLA RDW  Date Value Ref Range Status  03/08/2023 12.0 11.0 - 15.0 % Final  01/07/2015 12.9 12.3 - 15.4 % Final

## 2023-12-03 ENCOUNTER — Ambulatory Visit

## 2023-12-03 DIAGNOSIS — L438 Other lichen planus: Secondary | ICD-10-CM

## 2023-12-03 DIAGNOSIS — L508 Other urticaria: Secondary | ICD-10-CM

## 2023-12-03 MED ORDER — TRIAMCINOLONE ACETONIDE 0.1 % EX OINT: TOPICAL_OINTMENT | CUTANEOUS | 5 refills | Status: DC

## 2023-12-03 NOTE — Progress Notes (Signed)
    Subjective   Jeremy [name] is a 63 y.o. male who presents for the following: Rash. Patient is new patient.   Today patient reports: Rash/hives at arms, legs, thighs x couple of months, intermittent. No new changed in medications. Itchy when whelps come up, usually takes an antihistamine when they appear, otc hydrocortisone  cream. 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 Now resolved.   Review of Systems:    No other skin or systemic complaints except as noted in HPI or Assessment and Plan.  The following portions of the chart were reviewed this encounter and updated as appropriate: medications, allergies, medical history  Relevant Medical History:  n/a   Objective  Well appearing patient in no apparent distress; mood and affect are within normal limits. Examination was performed of the: Focused Exam of: Arms and mouth    Examination notable for: no rash Mouth clear  Examination limited by: Undergarments, Shoes or socks , Clothing, and Patient deferred removal       Assessment & Plan   Favor chronic Spontaneous Urticaria based on history - without active eruption today  Chronic and persistent condition with duration or expected duration over one year. Condition is symptomatic and bothersome to patient. Patient is flaring and not currently at treatment goal.  - Explained the process of cholinergic urticaria that can be associated with overheating, overstimulation - Initiating cetirizine  10 mg (OTC) 1-3 pills daily as tolerated.   - Advised can use up to 4x the recommended dose of antihistamines - Stressed the importance of taking these pills even when not itching.  - Encouraged avoidance of exacerbating factors (e.g. Heat) - Can consider Allergy referral, omalizumab, dupixent in the future  - Consider rhapsido (remibrutinib)  start triamcinolone  ointment 0.1% twice daily to affected areas of skin Discussed side effect of potent topical  steroids including atrophy, dyspigmentation, striae, telangectasia, folliculitis, loss of skin pigment, hair growth, tachyphylaxis, risk of systemic absorption with missuse.  Oral lichen planus - Discussed diagnosis, typical course, and treatment options for this condition - Chance of malignant transformation of oral LP is low (0.4-3.3%), discussed risk with patient - Significant improvement s/p course of flagyl  in 2021  - has clobetasol gel - recommended using prn for flares      Level of service outlined above   Procedures, orders, diagnosis for this visit:    There are no diagnoses linked to this encounter.  Return to clinic: Return if symptoms worsen or fail to improve.  I, Jacquelynn V. Wilfred, CMA, am acting as scribe for Lauraine JAYSON Kanaris, MD .  Documentation: I have reviewed the above documentation for accuracy and completeness, and I agree with the above.  Lauraine JAYSON Kanaris, MD

## 2023-12-03 NOTE — Patient Instructions (Addendum)
 - Start cetirizine  (Zyrtec ) 10 mg, one pill daily for one week. - If symptoms persist, increase to two pills daily for one week. - If symptoms persist, increase to three pills daily, splitting the dose (one in the morning, two at night). - If symptoms persist, increase to four pills daily, splitting the dose (two in the morning, two at night).    Due to recent changes in healthcare laws, you may see results of your pathology and/or laboratory studies on MyChart before the doctors have had a chance to review them. We understand that in some cases there may be results that are confusing or concerning to you. Please understand that not all results are received at the same time and often the doctors may need to interpret multiple results in order to provide you with the best plan of care or course of treatment. Therefore, we ask that you please give us  2 business days to thoroughly review all your results before contacting the office for clarification. Should we see a critical lab result, you will be contacted sooner.   If You Need Anything After Your Visit  If you have any questions or concerns for your doctor, please call our main line at [phone] and press option 4 to reach your doctor's medical assistant. If no one answers, please leave a voicemail as directed and we will return your call as soon as possible. Messages left after 4 pm will be answered the following business day.   You may also send us  a message via MyChart. We typically respond to MyChart messages within 1-2 business days.  For prescription refills, please ask your pharmacy to contact our office. Our fax number is [phone].  If you have an urgent issue when the clinic is closed that cannot wait until the next business day, you can page your doctor at the number below.    Please note that while we do our best to be available for urgent issues outside of office hours, we are not available 24/7.   If you have an urgent issue  and are unable to reach us , you may choose to seek medical care at your doctor's office, retail clinic, urgent care center, or emergency room.  If you have a medical emergency, please immediately call 911 or go to the emergency department.  Pager Numbers  - Dr. Hester: [phone]  - Dr. Jackquline: [phone]  - Dr. Claudene: [phone]   In the event of inclement weather, please call our main line at [phone] for an update on the status of any delays or closures.  Dermatology Medication Tips: Please keep the boxes that topical medications come in in order to help keep track of the instructions about where and how to use these. Pharmacies typically print the medication instructions only on the boxes and not directly on the medication tubes.   If your medication is too expensive, please contact our office at [phone] option 4 or send us  a message through MyChart.   We are unable to tell what your co-pay for medications will be in advance as this is different depending on your insurance coverage. However, we may be able to find a substitute medication at lower cost or fill out paperwork to get insurance to cover a needed medication.   If a prior authorization is required to get your medication covered by your insurance company, please allow us  1-2 business days to complete this process.  Drug prices often vary depending on where the prescription is filled and some  pharmacies may offer cheaper prices.  The website www.goodrx.com contains coupons for medications through different pharmacies. The prices here do not account for what the cost may be with help from insurance (it may be cheaper with your insurance), but the website can give you the price if you did not use any insurance.  - You can print the associated coupon and take it with your prescription to the pharmacy.  - You may also stop by our office during regular business hours and pick up a GoodRx coupon card.  - If you  need your prescription sent electronically to a different pharmacy, notify our office through [facility] or by phone at [phone] option 4.     Si Usted Necesita Algo Despus de Su Visita  Tambin puede enviarnos un mensaje a travs de Clinical Cytogeneticist. Por lo general respondemos a los mensajes de MyChart en el transcurso de 1 a 2 das hbiles.  Para renovar recetas, por favor pida a su farmacia que se ponga en contacto con nuestra oficina. Randi lakes de fax es [place] [phone].  Si tiene un asunto urgente cuando la clnica est cerrada y que no puede esperar hasta el siguiente da hbil, puede llamar/localizar a su doctor(a) al nmero que aparece a continuacin.   Por favor, tenga en cuenta que aunque hacemos todo lo posible para estar disponibles para asuntos urgentes fuera del horario de [place], no estamos disponibles las 24 horas del da, los 7 [address]  [phone] de la [place].   Si tiene un problema urgente y no puede comunicarse con nosotros, puede optar por buscar atencin mdica  en el consultorio de su doctor(a), en una clnica privada, en un centro de atencin urgente o en una sala de emergencias.  Si tiene engineer, drilling, por favor llame inmediatamente al 911 o vaya a la sala de emergencias.  Nmeros de bper  - Dr. Hester: [phone]  - Dra. Jackquline: [phone]  - Dr. Claudene: [phone]   En caso de inclemencias del tiempo, por favor llame a landry capes principal al [phone] para una actualizacin sobre el [place] de cualquier retraso o cierre.  Consejos para la medicacin en dermatologa: Por favor, guarde las cajas en las que vienen los medicamentos de uso tpico para ayudarle a seguir las instrucciones sobre dnde y cmo usarlos. Las farmacias generalmente imprimen las instrucciones del medicamento slo en las cajas y no directamente en los tubos del [place].   Si su medicamento es muy caro, por favor, pngase en contacto con landry rieger llamando al  [phone] y presione la opcin 4 o envenos un mensaje a travs de Clinical Cytogeneticist.   No podemos decirle cul ser su copago por los medicamentos por adelantado ya que esto es diferente dependiendo de la cobertura de su seguro. Sin embargo, es posible que podamos encontrar un medicamento sustituto a audiological scientist un formulario para que el seguro cubra el medicamento que se considera necesario.   Si se requiere una autorizacin previa para que su compaa de seguros cubra su medicamento, por favor permtanos de 1 a 2 das hbiles para completar este proceso.  Los precios de los medicamentos varan con frecuencia dependiendo del environmental consultant de dnde se surte la receta y alguna farmacias pueden ofrecer precios ms baratos.  El sitio web www.goodrx.com tiene cupones para medicamentos de health and safety inspector. Los precios aqu no tienen en cuenta lo que podra costar con la ayuda del seguro (puede ser ms barato con su seguro), pero el sitio web puede darle el precio  si no utiliz kelly services.  - Puede imprimir el cupn correspondiente y llevarlo con su receta a la farmacia.  - Tambin puede pasar por nuestra oficina durante el horario de atencin regular y education officer, museum una tarjeta de cupones de GoodRx.  - Si necesita que su receta se enve electrnicamente a una farmacia diferente, informe a nuestra oficina a travs de MyChart de [place] o por telfono llamando al [phone] y presione la opcin 4.

## 2023-12-04 ENCOUNTER — Ambulatory Visit: Attending: Cardiology | Admitting: Cardiology

## 2023-12-04 ENCOUNTER — Encounter: Payer: Self-pay | Admitting: Cardiology

## 2023-12-04 VITALS — BP 138/72 | HR 81 | Ht 72.0 in | Wt 213.0 lb

## 2023-12-04 DIAGNOSIS — I2510 Atherosclerotic heart disease of native coronary artery without angina pectoris: Secondary | ICD-10-CM

## 2023-12-04 DIAGNOSIS — I350 Nonrheumatic aortic (valve) stenosis: Secondary | ICD-10-CM

## 2023-12-04 DIAGNOSIS — E782 Mixed hyperlipidemia: Secondary | ICD-10-CM

## 2023-12-04 DIAGNOSIS — I10 Essential (primary) hypertension: Secondary | ICD-10-CM | POA: Diagnosis not present

## 2023-12-04 DIAGNOSIS — R011 Cardiac murmur, unspecified: Secondary | ICD-10-CM

## 2023-12-04 NOTE — Progress Notes (Signed)
 Cardiology Office Note   Date:  12/04/2023  ID:  Kipper, Buch 1960-09-19, MRN [id number] PCP: Edman Marsa PARAS, DO  [facility] Providers Cardiologist:  Redell Cave, MD Cardiology APP:  Gerard Frederick, NP     History of Present Illness Jeremy [name] is a 63 y.o. male with a past medical history of coronary artery disease with a calcium  score of 1245 (03/2023), aortic stenosis, hypertension, hyperlipidemia, former smoker x 20+ years, GERD, prediabetes, obesity, who is here today for follow-up.   He was previously seen in clinic on 03/20/2023 by Dr.Agbor-Etang for the evaluation of elevated coronary artery calcium  score.  Calcium  score on 03/16/2023 showed a value of 1245 mL with LAD, left circumflex, RCA, be in the 96 percentile.  He had previously been on atorvastatin  and it was increased to 40 mg daily and he was started on aspirin  81 mg daily.  He was scheduled for a Lexiscan  Myoview  as well as an echocardiogram.   Previously was seen in clinic 04/11/2023 and doing well with cardiac perspective.  Lotrel had been increased due to elevated blood pressures.  Been on atorvastatin  since the end of January.  There was no further testing that was ordered at that time.   He was last seen in clinic in April 2025 he was doing well with cardiac perspective.  Continues to deny any anginal or anginal equivalents.  There was no further workup that was needed at that time.  Blood pressure was better controlled with the increase in Lotrel.  He was started for updated lab abs.  Was recommended for annual studies of his echocardiogram for his moderate aortic stenosis.  He returns to clinic today stating overall for the cardiac perspective he has been doing well.  Denies any chest pain, shortness breath, dyspnea on exertion.  States that he has been compliant with his current medication regimen.  Has been working on dietary changes and weight loss and has continued to lose weight.  He  has followed up with dermatology as he has started with a rash she had a skin condition.  Has been advised to start taking Zyrtec  daily.  Is excited for the upcoming holidays with family moving closer.  Denies any hospitalization or visits to the emergency department.  ROS: 10 point review of system has been reviewed and considered negative the exception was listed in the HPI  Studies Reviewed EKG Interpretation Date/Time:  Tuesday December 04 2023 10:51:24 EDT Ventricular Rate:  81 PR Interval:  144 QRS Duration:  88 QT Interval:  378 QTC Calculation: 439 R Axis:   -6  Text Interpretation: Sinus rhythm with Premature atrial complexes When compared with ECG of 20-Mar-2023 08:17, Premature atrial complexes are now Present Confirmed by Gerard Frederick ([id number]) on 12/04/2023 10:56:01 AM    2D echo 04/10/2023 1. Left ventricular ejection fraction, by estimation, is 55 to 60%. Left  ventricular ejection fraction by 3D volume is 55 %. The left ventricle has  normal function. The left ventricle has no regional wall motion  abnormalities. Left ventricular diastolic   parameters are consistent with Grade I diastolic dysfunction (impaired  relaxation). The average left ventricular global longitudinal strain is  -19.5 %. The global longitudinal strain is normal.   2. Right ventricular systolic function is normal. The right ventricular  size is normal. There is normal pulmonary artery systolic pressure. The  estimated right ventricular systolic pressure is 24.4 mmHg.   3. The mitral valve is normal in structure. No  evidence of mitral valve  regurgitation. No evidence of mitral stenosis.   4. The aortic valve has an indeterminant number of cusps. There is mild  calcification of the aortic valve. Aortic valve regurgitation is mild.  Moderate aortic valve stenosis. Aortic valve area, by VTI measures 1.68  cm. Aortic valve mean gradient  measures 27.0 mmHg. Aortic valve Vmax measures 3.43 m/s.   5. The  inferior vena cava is normal in size with greater than 50%  respiratory variability, suggesting right atrial pressure of 3 mmHg.    Lexiscan  MPI 03/23/2023   Normal pharmacologic myocardial perfusion stress test without evidence of significant ischemia or scar.   Left ventricular systolic function is normal (LVEF greater than 65%).   Coronary artery calcification and aortic atherosclerosis are noted on the attenuation correction CT.   This is a low risk study   cCTA 03/16/2023 IMPRESSION AND RECOMMENDATION: 1. Coronary calcium  score of 1245. This was 96th percentile for age and sex matched control.   2. CAC >300 in LAD, LCx, RCA. CAC-DRS A3/N3.   3. Recommend aspirin  and statin if no contraindication.   4. Recommend cardiology consultation.   5. Continue heart healthy lifestyle and risk factor modification.  Risk Assessment/Calculations           Physical Exam VS:  BP 138/72 (BP Location: Left Arm, Patient Position: Sitting, Cuff Size: Normal)   Pulse 81   Ht 6' (1.829 m)   Wt 213 lb (96.6 kg)   SpO2 98%   BMI 28.89 kg/m        Wt Readings from Last 3 Encounters:  12/04/23 213 lb (96.6 kg)  09/13/23 209 lb 8 oz (95 kg)  09/04/23 218 lb (98.9 kg)    GEN: Well nourished, well developed in no acute distress NECK: No JVD; No carotid bruits, heart murmur radiates into the bilateral carotids CARDIAC: RRR, II/VI systolic murmur RUSB, without rubs or gallops RESPIRATORY:  Clear to auscultation without rales, wheezing or rhonchi  ABDOMEN: Soft, non-tender, non-distended EXTREMITIES:  No edema; No deformity   ASSESSMENT AND PLAN Coronary artery disease with coronary calcium  score 1245 of, LAD, RCA, left circumflex calcification.  Lexiscan  Myoview  completed considered low risk scan.  Continue aspirin  81 mg daily and atorvastatin  40 mg daily.  EKG today reveals sinus rhythm with PACs with a rate of 81 with no ischemic changes noted.  No further ischemic evaluation needed at this  time.  Primary hypertension with a blood pressure today of 138/72.  Blood pressure is well-controlled.  He is continued on Lotrel 5 mg/10 mg twice daily.  Blood pressure has been better controlled with twice daily dosing.  He has been encouraged to continue to monitor his pressures 1 to 2 hours postmedication administration as well.  Moderate aortic stenosis noted on echocardiogram with an LVEF 55 to 60%, no RWMA, G1 DD, normal RV SF, mild calcification of the aortic valve with mild aortic regurgitation moderate aortic valve stenosis with aortic valve area by VTI measuring 1.68 cm, aortic valve mean gradient measured 27 mmHg, yearly studies are recommended with last study being 04/10/23 will need study in March 2026.  Mixed hyperlipidemia with an LDL of 72 which is improved.  He has been continued on atorvastatin  40 mg daily.  Encouraged to continue on his journey of weight loss and continue with dietary modification as he has had huge improvements       Dispo: Patient to return to clinic to see MD/APP in 6 months or  sooner if needed for reevaluation  Signed, Ivor Kishi, NP

## 2023-12-04 NOTE — Patient Instructions (Signed)
 Medication Instructions:  Your physician recommends that you continue on your current medications as directed. Please refer to the Current Medication list given to you today.   *If you need a refill on your cardiac medications before your next appointment, please call your pharmacy*  Lab Work: No labs ordered today  If you have labs (blood work) drawn today and your tests are completely normal, you will receive your results only by: MyChart Message (if you have MyChart) OR A paper copy in the mail If you have any lab test that is abnormal or we need to change your treatment, we will call you to review the results.  Testing/Procedures: Your physician has requested that you have an echocardiogram after 3/5/23026. Echocardiography is a painless test that uses sound waves to create images of your heart. It provides your doctor with information about the size and shape of your heart and how well your heart's chambers and valves are working.   You may receive an ultrasound enhancing agent through an IV if needed to better visualize your heart during the echo. This procedure takes approximately one hour.  There are no restrictions for this procedure.  This will take place at [address] [facility] [address], [place] [address]  Please note: We ask at that you not bring children with you during ultrasound (echo/ vascular) testing. Due to room size and safety concerns, children are not allowed in the ultrasound rooms during exams. Our front office staff cannot provide observation of children in our lobby area while testing is being conducted. An adult accompanying a patient to their appointment will only be allowed in the ultrasound room at the discretion of the ultrasound technician under special circumstances. We apologize for any inconvenience.   Follow-Up: At [facility], you and your health needs are our priority.  As part of our continuing mission to provide you with  exceptional heart care, our providers are all part of one team.  This team includes your primary Cardiologist (physician) and Advanced Practice Providers or APPs (Physician Assistants and Nurse Practitioners) who all work together to provide you with the care you need, when you need it.  Your next appointment:   6 month(s)  Provider:   You may see Redell Cave, MD or one of the following Advanced Practice Providers on your designated Care Team:   Tylene Lunch, NP

## 2023-12-11 MED ORDER — TRIAMCINOLONE ACETONIDE 0.1 % EX CREA: TOPICAL_CREAM | CUTANEOUS | 1 refills | Status: AC

## 2024-01-15 ENCOUNTER — Ambulatory Visit

## 2024-01-16 ENCOUNTER — Ambulatory Visit

## 2024-01-16 DIAGNOSIS — L508 Other urticaria: Secondary | ICD-10-CM

## 2024-01-16 DIAGNOSIS — R21 Rash and other nonspecific skin eruption: Secondary | ICD-10-CM

## 2024-01-16 MED ORDER — RHAPSIDO 25 MG PO TABS: 1.0000 | ORAL_TABLET | Freq: Two times a day (BID) | ORAL | 11 refills | Status: DC

## 2024-01-16 MED ORDER — DUPIXENT 300 MG/2ML ~~LOC~~ SOAJ: 300.0000 mg | SUBCUTANEOUS | 10 refills | Status: DC

## 2024-01-16 MED ORDER — DUPIXENT 300 MG/2ML ~~LOC~~ SOAJ: 600.0000 mg | Freq: Once | SUBCUTANEOUS | 0 refills | Status: AC

## 2024-01-16 NOTE — Patient Instructions (Addendum)
 [facility] Your prescription has been sent to [facility].  [facility] will call you to obtain additional  information needed to fill your prescription such as:  Additional insurance information Payment for any out-of-pocket expense (may require credit card) Address for drug delivery shipment  A timely response to the pharmacy may help you obtain your medication more quickly. Please call [facility] at [phone].   Due to recent changes in healthcare laws, you may see results of your pathology and/or laboratory studies on MyChart before the doctors have had a chance to review them. We understand that in some cases there may be results that are confusing or concerning to you. Please understand that not all results are received at the same time and often the doctors may need to interpret multiple results in order to provide you with the best plan of care or course of treatment. Therefore, we ask that you please give us  2 business days to thoroughly review all your results before contacting the office for clarification. Should we see a critical lab result, you will be contacted sooner.   If You Need Anything After Your Visit  If you have any questions or concerns for your doctor, please call our main line at [phone] and press option 4 to reach your doctor's medical assistant. If no one answers, please leave a voicemail as directed and we will return your call as soon as possible. Messages left after 4 pm will be answered the following business day.   You may also send us  a message via MyChart. We typically respond to MyChart messages within 1-2 business days.  For prescription refills, please ask your pharmacy to contact our office. Our fax number is [phone].  If you have an urgent issue when the clinic is closed that cannot wait until the next business day, you can page your doctor at the number below.    Please note that while we do our  best to be available for urgent issues outside of office hours, we are not available 24/7.   If you have an urgent issue and are unable to reach us , you may choose to seek medical care at your doctor's office, retail clinic, urgent care center, or emergency room.  If you have a medical emergency, please immediately call 911 or go to the emergency department.  Pager Numbers  - Dr. Hester: [phone]  - Dr. Jackquline: [phone]  - Dr. Claudene: [phone]   - Dr. Raymund: [phone]  In the event of inclement weather, please call our main line at [phone] for an update on the status of any delays or closures.  Dermatology Medication Tips: Please keep the boxes that topical medications come in in order to help keep track of the instructions about where and how to use these. Pharmacies typically print the medication instructions only on the boxes and not directly on the medication tubes.   If your medication is too expensive, please contact our office at [phone] option 4 or send us  a message through MyChart.   We are unable to tell what your co-pay for medications will be in advance as this is different depending on your insurance coverage. However, we may be able to find a substitute medication at lower cost or fill out paperwork to get insurance to cover a needed medication.   If a prior authorization is required to get your medication covered by your insurance company, please allow us  1-2 business days to complete this process.  Drug prices often vary depending on where the prescription is filled and some pharmacies may offer cheaper prices.  The website www.goodrx.com contains coupons for medications through different pharmacies. The prices here do not account for what the cost may be with help from insurance (it may be cheaper with your insurance), but the website can give you the price if you did not use any insurance.  - You can print the associated coupon and take it  with your prescription to the pharmacy.  - You may also stop by our office during regular business hours and pick up a GoodRx coupon card.  - If you need your prescription sent electronically to a different pharmacy, notify our office through [facility] or by phone at [phone] option 4.     Si Usted Necesita Algo Despus de Su Visita  Tambin puede enviarnos un mensaje a travs de Clinical cytogeneticist. Por lo general respondemos a los mensajes de MyChart en el transcurso de 1 a 2 das hbiles.  Para renovar recetas, por favor pida a su farmacia que se ponga en contacto con nuestra oficina. Randi lakes de fax es [place] [phone].  Si tiene un asunto urgente cuando la clnica est cerrada y que no puede esperar hasta el siguiente da hbil, puede llamar/localizar a su doctor(a) al nmero que aparece a continuacin.   Por favor, tenga en cuenta que aunque hacemos todo lo posible para estar disponibles para asuntos urgentes fuera del horario de [place], no estamos disponibles las 24 horas del da, los 7 [address]  [address] de la [place].   Si tiene un problema urgente y no puede comunicarse con nosotros, puede optar por buscar atencin mdica  en el consultorio de su doctor(a), en una clnica privada, en un centro de atencin urgente o en una sala de emergencias.  Si tiene Engineer, drilling, por favor llame inmediatamente al 911 o vaya a la sala de emergencias.  Nmeros de bper  - Dr. Hester: [phone]  - Dra. Jackquline: [phone]  - Dr. Claudene: [phone]  - Dra. Kitts: [phone]  En caso de inclemencias del [place], por favor llame a nuestra lnea principal al [phone] para una actualizacin sobre el estado de cualquier retraso o cierre.  Consejos para la medicacin en dermatologa: Por favor, guarde las cajas en las que vienen los medicamentos de uso tpico para ayudarle a seguir las instrucciones sobre dnde y cmo usarlos. Las farmacias generalmente imprimen las instrucciones  del medicamento slo en las cajas y no directamente en los tubos del [place].   Si su medicamento es muy caro, por favor, pngase en contacto con landry rieger llamando al [phone] y presione la opcin 4 o envenos un mensaje a travs de Clinical cytogeneticist.   No podemos decirle cul ser su copago por los medicamentos por adelantado ya que esto es diferente dependiendo de la cobertura de su seguro. Sin embargo, es posible que podamos encontrar un medicamento sustituto a Audiological scientist un formulario para que el seguro cubra el medicamento que se considera necesario.   Si se requiere una autorizacin previa para que su compaa de seguros malta su medicamento, por favor permtanos de 1 a 2 das hbiles para completar este proceso.  Los precios de los medicamentos varan con frecuencia dependiendo del Environmental consultant de dnde se surte la receta y alguna farmacias pueden ofrecer precios ms baratos.  El sitio web www.goodrx.com tiene cupones para medicamentos de Health and safety inspector. Los precios aqu no tienen en cuenta lo que podra costar con la [employer]  del seguro (puede ser ms barato con su seguro), pero el sitio web puede darle el precio si no Visual merchandiser.  - Puede imprimir el cupn correspondiente y llevarlo con su receta a la farmacia.  - Tambin puede pasar por nuestra oficina durante el horario de atencin regular y Education officer, museum una tarjeta de cupones de GoodRx.  - Si necesita que su receta se enve electrnicamente a una farmacia diferente, informe a nuestra oficina a travs de MyChart de [place] o por telfono llamando al [phone] y presione la opcin 4.

## 2024-01-16 NOTE — Progress Notes (Signed)
°  °  Subjective   Jeremy [name] is a 63 y.o. male who presents for the following: Follow up of rash. Patient is established patient   Today patient reports: Patient states he takes 4 zyrtec  a day, and rash is more active under stress and sweating. Rash spreads from neck to toe. Triamcinolone  cream used when breaking out helps some. Takes antihistamine when having breakouts.   Review of Systems:    No other skin or systemic complaints except as noted in HPI or Assessment and Plan.  The following portions of the chart were reviewed this encounter and updated as appropriate: medications, allergies, medical history  Relevant Medical History:  n/a   Objective  (SKPE) Well appearing patient in no apparent distress; mood and affect are within normal limits. 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  - Patient without active eruption today. Some improvement with topical steroids and antihistamines but not at goal  Chronic and persistent condition with duration or expected duration over one year. Condition is symptomatic and bothersome to patient. Patient is flaring and not currently at treatment goal.  - Explained the process of cholinergic urticaria that can be associated with overheating, overstimulation - Continue cetirizine  10 mg (OTC) 1-3 pills daily as tolerated.              - Advised can use up to 4x the recommended dose of antihistamines - Stressed the importance of taking these pills even when not itching.  - Encouraged avoidance of exacerbating factors (e.g. Heat) Continue triamcinolone  ointment 0.1% twice daily to affected areas of skin Discussed side effect of potent topical steroids  including atrophy, dyspigmentation, striae, telangectasia, folliculitis, loss of skin pigment, hair growth, tachyphylaxis, risk of systemic absorption with missuse.  Discussed considering dupixent or rhapsido (remibrutinib). Prefers injectable medication  - Start dupilumab - ADULTS - 600mg  subcutaneously on day 0, then 300mg  every other week starting day 1 - Side effects reviewed at length: injection site reactions, nasopharyngitis/conjunctivitis/URI, headache, exacerbation of atopic dermatitis, parasitic infection (be careful when traveling or camping) - Discussed needs to have container for sharps and proper disposal   Procedures, orders, diagnosis for this visit:    There are no diagnoses linked to this encounter.  Return to clinic: Return in about 6 weeks (around 02/27/2024) for f/u w/ Dr. Raymund.  I, Almetta Nora, RMA, am acting as scribe for Lauraine JAYSON Raymund, MD .   Documentation: I have reviewed the above documentation for accuracy and completeness, and I agree with the above.  Lauraine JAYSON Raymund, MD

## 2024-01-21 ENCOUNTER — Other Ambulatory Visit: Payer: Self-pay

## 2024-01-21 MED ORDER — DUPIXENT 300 MG/2ML ~~LOC~~ SOAJ: 600.0000 mg | Freq: Once | SUBCUTANEOUS | 0 refills | Status: AC

## 2024-01-21 MED ORDER — DUPIXENT 300 MG/2ML ~~LOC~~ SOAJ: 300.0000 mg | SUBCUTANEOUS | 10 refills | Status: AC

## 2024-01-21 NOTE — Progress Notes (Signed)
 Loading dose sent to pharmacy.

## 2024-02-06 ENCOUNTER — Ambulatory Visit: Payer: Self-pay

## 2024-02-06 NOTE — Telephone Encounter (Signed)
 FYI Only or Action Required?: Pt requesting PCP to send a script for cough medicine to the pharmacy.   Patient was last seen in primary care on 09/13/2023 by Edman Marsa PARAS, DO.  Called Nurse Triage reporting Cough.  Symptoms began several days ago.  Interventions attempted: OTC medications: Mucinex  DM, tylenol , cough drops.  Symptoms are: unchanged.  Triage Disposition: Home Care  Patient/caregiver understands and will follow disposition?:   Reason for Disposition  Cough  Answer Assessment - Initial Assessment Questions Pt calling to report lingering cough. Cold symptoms started 02/01/24 - head congestion, fever, cough, night sweats, productive cough once in a while - brown/green. Symptoms have since then been under control besides the cough. Pt states he is coughing so much he is having burning in chest and muscle soreness in the abdomen from coughing so much.   Pulse ox consistently between: 97-98%. Home tests for Covid and flu negative. Pt taking Mucinex  DM, cough drops, tylenol .  Pt requesting PCP to send a script for cough medicine to the pharmacy.   1. ONSET: When did the cough begin?      02/01/24 2. SEVERITY: How bad is the cough today?      Bad - abd soreness and burning in the chest 3. SPUTUM: Describe the color of your sputum (e.g., none, dry cough; clear, white, yellow, green)     Brown/green 4. HEMOPTYSIS: Are you coughing up any blood? If Yes, ask: How much? (e.g., flecks, streaks, tablespoons, etc.)     Denies  5. DIFFICULTY BREATHING: Are you having difficulty breathing? If Yes, ask: How bad is it? (e.g., mild, moderate, severe)      Denies  6. FEVER: Do you have a fever? If Yes, ask: What is your temperature, how was it measured, and when did it start?     Denies  7. CARDIAC HISTORY: Do you have any history of heart disease? (e.g., heart attack, congestive heart failure)      Yes  8. LUNG HISTORY: Do you have any history of lung  disease?  (e.g., pulmonary embolus, asthma, emphysema)     N/a  9. PE RISK FACTORS: Do you have a history of blood clots? (or: recent major surgery, recent prolonged travel, bedridden)     N/a  10. OTHER SYMPTOMS: Do you have any other symptoms? (e.g., runny nose, wheezing, chest pain)       Denies  Protocols used: Cough - Acute Productive-A-AH  Copied from CRM #[id number]. Topic: Clinical - Red Word Triage >> Feb 06, 2024 10:05 AM Shanda MATSU wrote: Red Word that prompted transfer to Nurse Triage: Patient is reporting cough since after Christmas, patient reporting cough is so bad it causes him have lung and stomach pains, patient also reported chills and slight fever.

## 2024-02-06 NOTE — Telephone Encounter (Signed)
 Left message for patient to return call OK to advise    Patient will need an appointment, added him to Dr Zaforiv for Friday at 2 pm. If he declines appointment please cancel.

## 2024-02-08 ENCOUNTER — Ambulatory Visit

## 2024-02-09 ENCOUNTER — Other Ambulatory Visit: Payer: Self-pay | Admitting: Cardiology

## 2024-02-09 ENCOUNTER — Other Ambulatory Visit: Payer: Self-pay | Admitting: Family Medicine

## 2024-02-09 DIAGNOSIS — I10 Essential (primary) hypertension: Secondary | ICD-10-CM

## 2024-02-09 DIAGNOSIS — K219 Gastro-esophageal reflux disease without esophagitis: Secondary | ICD-10-CM

## 2024-02-11 ENCOUNTER — Other Ambulatory Visit: Payer: Self-pay

## 2024-02-11 MED ORDER — DUPIXENT 300 MG/2ML ~~LOC~~ SOAJ: 300.0000 mg | SUBCUTANEOUS | 1 refills | Status: AC

## 2024-02-12 NOTE — Telephone Encounter (Signed)
 Requested Prescriptions  Pending Prescriptions Disp Refills   pantoprazole  (PROTONIX ) 40 MG tablet [Pharmacy Med Name: Pantoprazole  Sodium 40 MG Oral Tablet Delayed Release] 90 tablet 0    Sig: TAKE 1 TABLET BY MOUTH DAILY  BEFORE BREAKFAST     Gastroenterology: Proton Pump Inhibitors Passed - 02/12/2024  8:48 AM      Passed - Valid encounter within last 12 months    Recent Outpatient Visits           5 months ago Prediabetes   [facility] Edman Marsa PARAS, DO   11 months ago Annual physical exam   [facility] Edman Marsa PARAS, DO       Future Appointments             In 2 weeks Raymund, Lauraine BROCKS, MD [facility]

## 2024-02-28 ENCOUNTER — Ambulatory Visit

## 2024-02-28 DIAGNOSIS — L501 Idiopathic urticaria: Secondary | ICD-10-CM

## 2024-02-28 DIAGNOSIS — L508 Other urticaria: Secondary | ICD-10-CM

## 2024-02-28 MED ORDER — RHAPSIDO 25 MG PO TABS: 1.0000 | ORAL_TABLET | Freq: Two times a day (BID) | ORAL | 0 refills | Status: AC

## 2024-02-28 NOTE — Progress Notes (Signed)
 "   Subjective   Jeremy [name] is a 64 y.o. male who presents for the following: Follow up of urticaria. Patient is established patient   Today patient reports: Urticaria, pt states Dupixent  has helped about 50% and works for about 7-10 days before he starts to flare again. Patient sent detailed description of flares since his last visit via MyChart.  Review of Systems:    No other skin or systemic complaints except as noted in HPI or Assessment and Plan.  The following portions of the chart were reviewed this encounter and updated as appropriate: medications, allergies, medical history  Relevant Medical History:  n/a   Objective  (SKPE) Well appearing patient in no apparent distress; mood and affect are within normal limits. Examination was performed of the: Focused Exam of: the face, arms, and hands   Examination notable for: Urticaria: Many erythematous, edematous, indurated papules and plaques on the trunk, neck, and extremities   Examination limited by: Clothing     Assessment & Plan  (SKAP)   Chronic Urticaria Chronic and persistent condition with duration or expected duration over one year. Condition is symptomatic and bothersome to patient. Patient is flaring and not currently at treatment goal.  - Explained the process of cholinergic urticaria that can be associated with overheating, overstimulation - Prior treatments: antihistamines >6wks, topical steroids, dupixent  - Most recently has had some improvement with dupixent . States has good control for about 8-10 days but begins to flare before next injection - Discussed tx options: giving dupixent  more time vs transitioning to rhapsido  vs xolair  - Would like to start rhapsido  - Continue cetirizine  up to 40 mg daily  - Advised can continue benadryl 25 q6hr prn as rescue   - discussed increased risk of sedation  - Stressed the importance of taking these pills even when not itching.  - Encouraged avoidance of exacerbating  factors (e.g. Heat) - Can consider Allergy referral, omalizumab, dupixent  in the future  - Consider rhapsido  (remibrutinib )  - Start remibrutinib  25 mg BID  - Indications: for chronic spontaneous urticaria in adult patients who remain symptomatic despite 6wks of antihistamines  - Discussed side effects of medication at length: mucocutaneous bleeding in 9% of patients, nasopharyngitis, bleeding, headache, nausea and abdominal pain  - avoid use with live and live attenuated vaccines  - interactions: CYP3A4 inducers (decreases effectiveness of drug). No data on concomitant use with anticoagulants. Avoid in mild, moderate, or severe hepatic impairment.  - baseline labs not indicated but can consider checking CBC, CMP to assess plt, hepatic and kidney function - reviewed CMP from 09/2023 - minor elevation in AST. CBC from 02/2023 wnl  - Would like referral to Allergy - order placed  - Will assess thyroid function   Was sun protection counseling provided?: No   Level of service outlined above   Patient instructions (SKPI)   Procedures, orders, diagnosis for this visit:  IDIOPATHIC URTICARIA   This Visit - TSH - Thyroid Peroxidase Antibody - T4, Free - T3, Free - Remibrutinib  (RHAPSIDO ) 25 MG TABS - Take 1 tablet by mouth 2 (two) times daily.  Idiopathic urticaria -     Rhapsido ; Take 1 tablet by mouth 2 (two) times daily.  Dispense: 60 tablet; Refill: 0 -     TSH -     Thyroid peroxidase antibody -     T4, free -     T3, free    Return to clinic: Return in about 6 weeks (around 04/10/2024) for  urticaria follow up.  LILLETTE Rosina Mayans, CMA, am acting as scribe for Lauraine JAYSON Kanaris, MD .   Documentation: I have reviewed the above documentation for accuracy and completeness, and I agree with the above.  Lauraine JAYSON Kanaris, MD  "

## 2024-02-28 NOTE — Patient Instructions (Signed)

## 2024-02-29 LAB — T3, FREE: T3, Free: 3.9 pg/mL (ref 2.0–4.4)

## 2024-02-29 LAB — THYROID PEROXIDASE ANTIBODY: Thyroperoxidase Ab SerPl-aCnc: 10 [IU]/mL (ref 0–34)

## 2024-02-29 LAB — T4, FREE: Free T4: 1.43 ng/dL (ref 0.82–1.77)

## 2024-02-29 LAB — TSH: TSH: 1.77 u[IU]/mL (ref 0.450–4.500)

## 2024-03-04 ENCOUNTER — Ambulatory Visit: Payer: Self-pay

## 2024-03-04 NOTE — Progress Notes (Signed)
Patient informed and voiced good understanding.

## 2024-03-20 ENCOUNTER — Other Ambulatory Visit

## 2024-03-27 ENCOUNTER — Encounter: Admitting: Family Medicine

## 2024-04-10 ENCOUNTER — Ambulatory Visit

## 2024-04-14 ENCOUNTER — Ambulatory Visit
